# Patient Record
Sex: Male | Born: 1940
Health system: Southern US, Community
[De-identification: ages and names within clinical notes are randomized; demographics above are authoritative.]

## PROBLEM LIST (undated history)

## (undated) DIAGNOSIS — R0609 Other forms of dyspnea: Secondary | ICD-10-CM

## (undated) DIAGNOSIS — I10 Essential (primary) hypertension: Secondary | ICD-10-CM

## (undated) DIAGNOSIS — R059 Cough, unspecified: Secondary | ICD-10-CM

## (undated) DIAGNOSIS — R9439 Abnormal result of other cardiovascular function study: Secondary | ICD-10-CM

## (undated) DIAGNOSIS — I5020 Unspecified systolic (congestive) heart failure: Secondary | ICD-10-CM

## (undated) DIAGNOSIS — I998 Other disorder of circulatory system: Secondary | ICD-10-CM

## (undated) DIAGNOSIS — N200 Calculus of kidney: Secondary | ICD-10-CM

## (undated) DIAGNOSIS — I209 Angina pectoris, unspecified: Secondary | ICD-10-CM

## (undated) DIAGNOSIS — G43909 Migraine, unspecified, not intractable, without status migrainosus: Secondary | ICD-10-CM

## (undated) DIAGNOSIS — E668 Other obesity: Secondary | ICD-10-CM

## (undated) DIAGNOSIS — K219 Gastro-esophageal reflux disease without esophagitis: Secondary | ICD-10-CM

## (undated) DIAGNOSIS — R05 Cough: Secondary | ICD-10-CM

## (undated) DIAGNOSIS — N183 Chronic kidney disease, stage 3 (moderate): Secondary | ICD-10-CM

## (undated) DIAGNOSIS — I2510 Atherosclerotic heart disease of native coronary artery without angina pectoris: Secondary | ICD-10-CM

## (undated) DIAGNOSIS — E7800 Pure hypercholesterolemia, unspecified: Secondary | ICD-10-CM

## (undated) DIAGNOSIS — J309 Allergic rhinitis, unspecified: Secondary | ICD-10-CM

## (undated) DIAGNOSIS — G459 Transient cerebral ischemic attack, unspecified: Secondary | ICD-10-CM

## (undated) DIAGNOSIS — G20A1 Parkinson's disease without dyskinesia, without mention of fluctuations: Secondary | ICD-10-CM

## (undated) HISTORY — DX: Essential (primary) hypertension: I10

## (undated) HISTORY — PX: HERNIA REPAIR: SHX51

## (undated) HISTORY — PX: TONSILLECTOMY: SUR1361

## (undated) HISTORY — DX: Cough: R05

## (undated) HISTORY — DX: Other disorder of circulatory system: I99.8

## (undated) HISTORY — DX: Abnormal result of other cardiovascular function study: R94.39

## (undated) HISTORY — DX: Other obesity: E66.8

## (undated) HISTORY — DX: Calculus of kidney: N20.0

## (undated) HISTORY — DX: Cough, unspecified: R05.9

## (undated) HISTORY — DX: Migraine, unspecified, not intractable, without status migrainosus: G43.909

## (undated) HISTORY — DX: Transient cerebral ischemic attack, unspecified: G45.9

## (undated) HISTORY — PX: OTHER SURGICAL HISTORY: SHX169

## (undated) HISTORY — DX: Other forms of dyspnea: R06.09

## (undated) HISTORY — DX: Pure hypercholesterolemia, unspecified: E78.00

## (undated) HISTORY — DX: Angina pectoris, unspecified: I20.9

## (undated) HISTORY — DX: Gastro-esophageal reflux disease without esophagitis: K21.9

## (undated) HISTORY — DX: Atherosclerotic heart disease of native coronary artery without angina pectoris: I25.10

## (undated) HISTORY — DX: Chronic kidney disease, stage 3 (moderate): N18.3

## (undated) HISTORY — DX: Allergic rhinitis, unspecified: J30.9

---

## 1997-11-17 ENCOUNTER — Ambulatory Visit (HOSPITAL_COMMUNITY): Admission: RE | Admit: 1997-11-17 | Discharge: 1997-11-17 | Payer: Self-pay | Admitting: *Deleted

## 1997-11-26 ENCOUNTER — Emergency Department (HOSPITAL_COMMUNITY): Admission: EM | Admit: 1997-11-26 | Discharge: 1997-11-26 | Payer: Self-pay | Admitting: Internal Medicine

## 1998-01-14 ENCOUNTER — Ambulatory Visit (HOSPITAL_COMMUNITY): Admission: RE | Admit: 1998-01-14 | Discharge: 1998-01-14 | Payer: Self-pay | Admitting: *Deleted

## 1999-07-26 ENCOUNTER — Encounter: Payer: Self-pay | Admitting: Emergency Medicine

## 1999-07-26 ENCOUNTER — Emergency Department (HOSPITAL_COMMUNITY): Admission: EM | Admit: 1999-07-26 | Discharge: 1999-07-26 | Payer: Self-pay | Admitting: Emergency Medicine

## 2000-01-05 ENCOUNTER — Encounter: Admission: RE | Admit: 2000-01-05 | Discharge: 2000-01-05 | Payer: Self-pay | Admitting: Internal Medicine

## 2000-01-05 ENCOUNTER — Encounter: Payer: Self-pay | Admitting: Internal Medicine

## 2000-01-25 ENCOUNTER — Emergency Department (HOSPITAL_COMMUNITY): Admission: EM | Admit: 2000-01-25 | Discharge: 2000-01-26 | Payer: Self-pay

## 2000-01-30 ENCOUNTER — Ambulatory Visit (HOSPITAL_COMMUNITY): Admission: RE | Admit: 2000-01-30 | Discharge: 2000-01-30 | Payer: Self-pay | Admitting: Emergency Medicine

## 2000-01-30 ENCOUNTER — Encounter: Payer: Self-pay | Admitting: Emergency Medicine

## 2000-02-02 ENCOUNTER — Emergency Department (HOSPITAL_COMMUNITY): Admission: EM | Admit: 2000-02-02 | Discharge: 2000-02-02 | Payer: Self-pay | Admitting: Emergency Medicine

## 2002-03-06 ENCOUNTER — Encounter: Payer: Self-pay | Admitting: Internal Medicine

## 2002-03-06 ENCOUNTER — Encounter: Admission: RE | Admit: 2002-03-06 | Discharge: 2002-03-06 | Payer: Self-pay | Admitting: Internal Medicine

## 2002-08-26 ENCOUNTER — Encounter: Payer: Self-pay | Admitting: Internal Medicine

## 2002-08-26 ENCOUNTER — Encounter: Admission: RE | Admit: 2002-08-26 | Discharge: 2002-08-26 | Payer: Self-pay | Admitting: Internal Medicine

## 2002-09-18 ENCOUNTER — Ambulatory Visit: Admission: RE | Admit: 2002-09-18 | Discharge: 2002-09-18 | Payer: Self-pay | Admitting: Internal Medicine

## 2002-09-18 ENCOUNTER — Encounter: Payer: Self-pay | Admitting: Orthopedic Surgery

## 2002-09-23 ENCOUNTER — Encounter: Payer: Self-pay | Admitting: Orthopedic Surgery

## 2002-09-23 ENCOUNTER — Inpatient Hospital Stay (HOSPITAL_COMMUNITY): Admission: RE | Admit: 2002-09-23 | Discharge: 2002-09-28 | Payer: Self-pay | Admitting: Orthopedic Surgery

## 2003-11-16 ENCOUNTER — Emergency Department (HOSPITAL_COMMUNITY): Admission: EM | Admit: 2003-11-16 | Discharge: 2003-11-16 | Payer: Self-pay | Admitting: Emergency Medicine

## 2004-09-28 ENCOUNTER — Inpatient Hospital Stay (HOSPITAL_COMMUNITY): Admission: RE | Admit: 2004-09-28 | Discharge: 2004-10-02 | Payer: Self-pay | Admitting: Orthopedic Surgery

## 2005-11-28 ENCOUNTER — Ambulatory Visit (HOSPITAL_COMMUNITY): Admission: RE | Admit: 2005-11-28 | Discharge: 2005-11-28 | Payer: Self-pay | Admitting: *Deleted

## 2005-11-28 ENCOUNTER — Encounter (INDEPENDENT_AMBULATORY_CARE_PROVIDER_SITE_OTHER): Payer: Self-pay | Admitting: *Deleted

## 2006-04-02 ENCOUNTER — Encounter: Admission: RE | Admit: 2006-04-02 | Discharge: 2006-04-02 | Payer: Self-pay | Admitting: Internal Medicine

## 2006-05-16 ENCOUNTER — Emergency Department (HOSPITAL_COMMUNITY): Admission: EM | Admit: 2006-05-16 | Discharge: 2006-05-16 | Payer: Self-pay | Admitting: Emergency Medicine

## 2006-06-07 ENCOUNTER — Encounter: Admission: RE | Admit: 2006-06-07 | Discharge: 2006-06-07 | Payer: Self-pay | Admitting: Internal Medicine

## 2006-07-02 ENCOUNTER — Encounter: Admission: RE | Admit: 2006-07-02 | Discharge: 2006-07-02 | Payer: Self-pay | Admitting: Internal Medicine

## 2006-08-22 ENCOUNTER — Encounter: Admission: RE | Admit: 2006-08-22 | Discharge: 2006-08-22 | Payer: Self-pay | Admitting: Otolaryngology

## 2007-01-30 ENCOUNTER — Ambulatory Visit (HOSPITAL_COMMUNITY): Admission: RE | Admit: 2007-01-30 | Discharge: 2007-01-30 | Payer: Self-pay | Admitting: *Deleted

## 2007-07-22 ENCOUNTER — Ambulatory Visit: Payer: Self-pay | Admitting: Internal Medicine

## 2007-07-22 DIAGNOSIS — R059 Cough, unspecified: Secondary | ICD-10-CM | POA: Insufficient documentation

## 2007-07-22 DIAGNOSIS — I10 Essential (primary) hypertension: Secondary | ICD-10-CM | POA: Insufficient documentation

## 2007-07-22 DIAGNOSIS — R05 Cough: Secondary | ICD-10-CM | POA: Insufficient documentation

## 2007-08-05 ENCOUNTER — Ambulatory Visit: Payer: Self-pay | Admitting: Internal Medicine

## 2007-11-25 ENCOUNTER — Ambulatory Visit: Payer: Self-pay | Admitting: Internal Medicine

## 2007-12-01 ENCOUNTER — Telehealth (INDEPENDENT_AMBULATORY_CARE_PROVIDER_SITE_OTHER): Payer: Self-pay | Admitting: *Deleted

## 2007-12-03 ENCOUNTER — Ambulatory Visit: Payer: Self-pay | Admitting: Cardiology

## 2007-12-30 ENCOUNTER — Ambulatory Visit: Payer: Self-pay | Admitting: Internal Medicine

## 2008-01-05 ENCOUNTER — Emergency Department (HOSPITAL_COMMUNITY): Admission: EM | Admit: 2008-01-05 | Discharge: 2008-01-05 | Payer: Self-pay | Admitting: Emergency Medicine

## 2008-01-07 ENCOUNTER — Emergency Department (HOSPITAL_COMMUNITY): Admission: EM | Admit: 2008-01-07 | Discharge: 2008-01-07 | Payer: Self-pay | Admitting: Emergency Medicine

## 2008-01-09 ENCOUNTER — Telehealth: Payer: Self-pay | Admitting: Internal Medicine

## 2008-01-13 ENCOUNTER — Telehealth: Payer: Self-pay | Admitting: Internal Medicine

## 2008-01-16 ENCOUNTER — Encounter: Admission: RE | Admit: 2008-01-16 | Discharge: 2008-01-16 | Payer: Self-pay | Admitting: Internal Medicine

## 2008-01-23 ENCOUNTER — Ambulatory Visit: Payer: Self-pay | Admitting: Internal Medicine

## 2008-01-23 DIAGNOSIS — J309 Allergic rhinitis, unspecified: Secondary | ICD-10-CM | POA: Insufficient documentation

## 2008-01-23 LAB — CONVERTED CEMR LAB: IgE (Immunoglobulin E), Serum: 141 intl units/mL (ref 0.0–180.0)

## 2008-01-27 ENCOUNTER — Telehealth: Payer: Self-pay | Admitting: Internal Medicine

## 2008-01-29 ENCOUNTER — Ambulatory Visit: Payer: Self-pay | Admitting: Internal Medicine

## 2008-02-02 ENCOUNTER — Telehealth: Payer: Self-pay | Admitting: Internal Medicine

## 2008-02-02 LAB — CONVERTED CEMR LAB
Basophils Absolute: 0 10*3/uL (ref 0.0–0.1)
Basophils Relative: 0 % (ref 0.0–3.0)
Eosinophils Absolute: 0.3 10*3/uL (ref 0.0–0.7)
Eosinophils Relative: 2.1 % (ref 0.0–5.0)
HCT: 48.5 % (ref 39.0–52.0)
MCHC: 34.8 g/dL (ref 30.0–36.0)
Neutro Abs: 11.6 10*3/uL — ABNORMAL HIGH (ref 1.4–7.7)
RDW: 13.1 % (ref 11.5–14.6)
WBC: 14.8 10*3/uL — ABNORMAL HIGH (ref 4.5–10.5)

## 2008-02-12 ENCOUNTER — Encounter: Payer: Self-pay | Admitting: Internal Medicine

## 2008-02-18 ENCOUNTER — Encounter: Admission: RE | Admit: 2008-02-18 | Discharge: 2008-03-11 | Payer: Self-pay | Admitting: Internal Medicine

## 2008-02-19 ENCOUNTER — Encounter: Payer: Self-pay | Admitting: Internal Medicine

## 2008-02-19 ENCOUNTER — Encounter: Admission: RE | Admit: 2008-02-19 | Discharge: 2008-02-19 | Payer: Self-pay | Admitting: Internal Medicine

## 2008-02-23 ENCOUNTER — Ambulatory Visit: Payer: Self-pay | Admitting: Internal Medicine

## 2008-03-09 ENCOUNTER — Encounter: Payer: Self-pay | Admitting: Internal Medicine

## 2008-04-01 ENCOUNTER — Ambulatory Visit: Payer: Self-pay | Admitting: Internal Medicine

## 2008-04-07 ENCOUNTER — Ambulatory Visit (HOSPITAL_COMMUNITY): Admission: RE | Admit: 2008-04-07 | Discharge: 2008-04-07 | Payer: Self-pay | Admitting: *Deleted

## 2008-04-21 ENCOUNTER — Encounter: Admission: RE | Admit: 2008-04-21 | Discharge: 2008-04-21 | Payer: Self-pay | Admitting: Internal Medicine

## 2008-05-23 ENCOUNTER — Encounter: Admission: RE | Admit: 2008-05-23 | Discharge: 2008-05-23 | Payer: Self-pay | Admitting: Orthopedic Surgery

## 2008-07-30 ENCOUNTER — Ambulatory Visit: Payer: Self-pay | Admitting: Internal Medicine

## 2008-11-16 ENCOUNTER — Ambulatory Visit: Payer: Self-pay | Admitting: Internal Medicine

## 2009-01-27 ENCOUNTER — Ambulatory Visit: Payer: Self-pay | Admitting: Internal Medicine

## 2009-02-10 ENCOUNTER — Telehealth (INDEPENDENT_AMBULATORY_CARE_PROVIDER_SITE_OTHER): Payer: Self-pay | Admitting: *Deleted

## 2009-03-29 ENCOUNTER — Ambulatory Visit: Payer: Self-pay | Admitting: Internal Medicine

## 2009-07-07 ENCOUNTER — Ambulatory Visit: Payer: Self-pay | Admitting: Internal Medicine

## 2009-09-13 ENCOUNTER — Ambulatory Visit: Payer: Self-pay | Admitting: Internal Medicine

## 2009-09-13 DIAGNOSIS — K219 Gastro-esophageal reflux disease without esophagitis: Secondary | ICD-10-CM | POA: Insufficient documentation

## 2009-12-19 ENCOUNTER — Ambulatory Visit: Payer: Self-pay | Admitting: Internal Medicine

## 2010-02-24 DIAGNOSIS — R9439 Abnormal result of other cardiovascular function study: Secondary | ICD-10-CM

## 2010-02-24 HISTORY — DX: Abnormal result of other cardiovascular function study: R94.39

## 2010-03-14 ENCOUNTER — Ambulatory Visit: Payer: Self-pay | Admitting: Internal Medicine

## 2010-04-18 ENCOUNTER — Ambulatory Visit: Payer: Self-pay | Admitting: Internal Medicine

## 2010-06-20 ENCOUNTER — Telehealth: Payer: Self-pay | Admitting: Internal Medicine

## 2010-06-20 NOTE — Assessment & Plan Note (Signed)
Summary: 6 months/apc   Primary Provider/Referring Provider:  Selena Batten  CC:  6 month follow up , c/o prod cough clear , and hoarsness x 2 weeks.  History of Present Illness: September 13, 2009- Allergic rhinitis, cough, ACE/ ARB induced cough Twice in past week had woken with burning liquid in throat. Says this began when protonix was changed to generic. He will speak to his primary about it. More sinus drainage- hadn't been taking his loratadine. Taking Zpak. Asks refill cough syrup. Had pneumonia vaccine earlier this Spring. He remains hoarse but cough and heartburn have faded.  December 19, 2009- Allergic rhinitis, cough, ACE/ ARB induced cough Stung by yellow jacket 3 weeks ago. Took benadryl. Has hx of throat swelling in remote past from sting. He has been feeling pressured or full in ears, especially left, postnasal drip, hoarseness. Using Neti pot 3 x/ day. Hydromet has been good, allowing sleep. Puffy eyes. chest has been clear. Denies heartburn or reflux.  March 14, 2010- Allergic rhinitis, cough, ACE/ ARB induced cough Nurse-CC: 6 month follow up , c/o prod cough clear , hoarsness x 2 weeks Chronic cough. Worse in 2 weeks. has been in [place] twice lately in recent weeks and at altitude his right ear pops. Dr Haroldine Laws, ENT had sent him here originally, suspecting allergy. Cough is productive in the mornings, has to cough open (scant) before he can eat. Cough wakes him. Continues Protonix.  Dr Selena Batten sent him for treadmill and cardiologist Dr Newt Lukes plans to cath. He remains off ACEI, and cough did seem to improve at first, but that is not the whole explanation.    Preventive Screening-Counseling & Management  Alcohol-Tobacco     Smoking Status: never  Current Medications (verified): 1)  Protonix 40 Mg  Tbec (Pantoprazole Sodium) .... Take One 30-60 Min Before First and Last Meals of The Day 2)  Adult Aspirin Low Strength 81 Mg  Tbdp (Aspirin) .... Once Daily 3)  Diovan 320 Mg Tabs  (Valsartan) .... Take 1 Tablet By Mouth Once A Day 4)  Sinus Wash Neti Pot 2300-700 Mg Kit (Sodium Chloride-Sodium Bicarb) .... Use As Directed 5)  Singulair 10 Mg Tabs (Montelukast Sodium) .Marland Kitchen.. 1 Once Daily 6)  Pravastatin Sodium 40 Mg Tabs (Pravastatin Sodium) .Marland Kitchen.. 1 Once Daily 7)  Carvedilol 6.25 Mg Tabs (Carvedilol) .Marland Kitchen.. 1 Once Daily  Allergies (verified): 1)  ! Novocain  Past History:  Past Medical History: Last updated: 02/23/2008 ALLERGIC RHINITIS (ICD-477.9) Skin test Pos 02/23/08 COUGH (ICD-786.2) HYPERTENSION (ICD-401.9)    Past Surgical History: Last updated: 11/16/2008 Tonsillectomy Reset nasal fracture Bilateral TKR Hernia  Family History: Last updated: 11/16/2008 allergy mother GM- TB Father- died MI  Social History: Last updated: 01/23/2008 Patient never smoked.  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  The patient denies shortness of breath with activity, shortness of breath at rest, coughing up blood, chest pain, irregular heartbeats, acid heartburn, indigestion, loss of appetite, weight change, abdominal pain, difficulty swallowing, sore throat, tooth/dental problems, headaches, nasal congestion/difficulty breathing through nose, and sneezing.    Vital Signs:  Patient profile:   70 year old male Height:      68 inches Weight:      223.8 pounds BMI:     34.15 O2 Sat:  98 % on Room air Pulse rate:   60 / minute BP sitting:   110 / 70  (left arm) Cuff size:   large  Vitals Entered By: Renold Genta RCP, LPN (March 14, 2010 3:17 PM)  O2 Flow:  Room air CC: 6 month follow up , c/o prod cough clear , hoarsness x 2 weeks Is Patient Diabetic? No Comments Medications reviewed with patient Renold Genta RCP, LPN  March 14, 2010 3:17 PM    Physical  Exam  Additional Exam:  General: A/Ox3; pleasant and cooperative, NAD, obese. he does not look acutely ill today. SKIN: no rash, lesions NODES: no lymphadenopathy HEENT: [place]/AT, EOM- WNL, Conjuctivae- clear, PERRLA, TM-WNL, , Nose- clear , Throat- Mallampati II, gravelly voice, cobblestoned . Marland Kitchen NECK: Supple w/ fair ROM, JVD- none, normal carotid impulses w/o bruits Thyroid-  CHEST: quiet with distant sounds but no rales or rhonchi HEART: RRR, no m/g/r heard ABDOMEN: overweight [id number]:[id number], nl pulses, trace edema, bilateral TKR scars NEURO: Grossly intact to observation      Impression & Recommendations:  Problem # 1:  COUGH (ICD-786.2)  I remain believing that his GERD is an important basis for cough and will direct this issue to Dr Selena Batten. He says he is taking his acid blocker. but his throat looks irritated. .  We will check PFT for question,  with chronic cough,  that there is a cough equivalent asthma.   Problem # 2:  ALLERGIC RHINITIS (ICD-477.9) He denies significant postnasal drip, sneezing or blowing. His updated medication list for this problem includes:    Sinus Wash Neti Pot 2300-700 Mg Kit (Sodium chloride-sodium bicarb) ..... Use as directed  Problem # 3:  GERD (ICD-530.81)  His updated medication list for this problem includes:    Protonix 40 Mg Tbec (Pantoprazole sodium) .Marland Kitchen... Take one 30-60 min before first and last meals of the day  Orders: Est. Patient Level III (06237)  Medications Added to Medication List This Visit: 1)  Singulair 10 Mg Tabs (Montelukast sodium) .Marland Kitchen.. 1 once daily 2)  Pravastatin Sodium 40 Mg Tabs (Pravastatin sodium) .Marland Kitchen.. 1 once daily 3)  Carvedilol 6.25 Mg Tabs (Carvedilol) .Marland Kitchen.. 1 once daily 4)  Hydromet 5-1.5 Mg/36ml Syrp (Hydrocodone-homatropine) .Marland Kitchen.. 1 teaspoon four times a day as needed cough  Other Orders: Misc. Referral (Misc. Ref)  Patient Instructions: 1)  Please schedule a follow-up appointment in 2 months. 2)  Schedule PFT 3)   cc Dr Selena Batten 4)  script for cough syrup Prescriptions: HYDROMET 5-1.5 MG/5ML SYRP (HYDROCODONE-HOMATROPINE) 1 teaspoon four times a day as needed cough  #200 ml x 1   Entered and Authorized by:   Waymon Budge MD   Signed by:   Waymon Budge MD on 03/14/2010   Method used:   Print then Give to Patient   RxID:   [id number]

## 2010-06-20 NOTE — Assessment & Plan Note (Signed)
Summary: congested/ sinus problem/ mbw   Primary Provider/Referring Provider:  Selena Batten  CC:  Accute visit-congestion in head and neck area-puffy eyes.Keith [name]  History of Present Illness: History of Present Illness: March 29, 2009- Allergic rhinitis, cough, ACE/ ARB induced cough Still coughs esp on lying down- blames on postnasal drip without reflux. Hydrocone for back pain will supress it. Changing BP med didn't correct the cough. He has had ENT eval before by Dr Haroldine Laws. He denies reflux. Skin test and RAST allergy eval were reviewed- atopic with reaction to grass, weed, tree. CXR- mild atelectasis.  July 07, 2009- Allergic rhinitis, cough, ACE/ARB induced cough Still notes postnasal drainage which hasn't responded to anything he tries. Mucinex hurts his stomach. 2-3 weeks ago burp refluxed bolus of liquid. Coughs as soon as he lies down. Primary gave Zpak and Chertussin cough syrup. He says he did prop up head of bed.  We reviewed allergy tests from 10/09, strongly positive then for tree and grass pollens. He has not tried allergy vaccine.  September 13, 2009- Allergic rhinitis, cough, ACE/ ARB induced cough Twice in past week had woken with burning liquid in throat. Says this began when protonix was changed to generic. He will speak to his primary about it. More sinus drainage- hadn't been taking his loratadine. Taking Zpak. Asks refill cough syrup. Had pneumonia vaccine earlier this Spring. He remains hoarse but cough and heartburn have faded.  December 19, 2009- Allergic rhinitis, cough, ACE/ ARB induced cough Stung by yellow jacket 3 weeks ago. Took benadryl. Has hx of throat swelling in remote past from sting. He has been feeling pressured or full in ears, especially left, postnasal drip, hoarseness. Using Neti pot 3 x/ day. Hydromet has been good, allowing sleep. Puffy eyes. chest has been clear. Denies heartburn or reflus.     Preventive Screening-Counseling &  Management  Alcohol-Tobacco     Smoking Status: never  Current Medications (verified): 1)  Protonix 40 Mg  Tbec (Pantoprazole Sodium) .... Take One 30-60 Min Before First and Last Meals of The Day 2)  Adult Aspirin Low Strength 81 Mg  Tbdp (Aspirin) .... Once Daily 3)  Diovan 320 Mg Tabs (Valsartan) .... Take 1 Tablet By Mouth Once A Day 4)  Hydromet 5-1.5 Mg/37ml Syrp (Hydrocodone-Homatropine) .Keith [name].. 1 Teaspoon Four Times A Day As Needed Occasional Use For Cough. 5)  Sinus Wash Neti Pot 2300-700 Mg Kit (Sodium Chloride-Sodium Bicarb) .... Use As Directed  Allergies (verified): 1)  ! Novocain  Past History:  Past Medical History: Last updated: 02/23/2008 ALLERGIC RHINITIS (ICD-477.9) Skin test Pos 02/23/08 COUGH (ICD-786.2) HYPERTENSION (ICD-401.9)    Past Surgical History: Last updated: 11/16/2008 Tonsillectomy Reset nasal fracture Bilateral TKR Hernia  Family History: Last updated: 11/16/2008 allergy mother GM- TB Father- died MI  Social History: Last updated: 01/23/2008 Patient never smoked.  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  The patient denies shortness of breath with activity, shortness of breath at rest, productive cough, non-productive cough, coughing up blood, chest pain, irregular heartbeats, indigestion, loss of appetite, weight change, abdominal pain, difficulty swallowing, sore throat, tooth/dental problems, headaches, and sneezing.    Vital Signs:  Patient profile:   70 year old male Height:      68 inches Weight:  221.38 pounds BMI:     33.78 O2 Sat:      98 % on Room air Pulse rate:   81 / minute BP sitting:   122 / 86  (right arm) Cuff size:   regular  Vitals Entered By: Reynaldo Minium CMA (December 19, 2009 2:36 PM)  O2 Flow:  Room  air CC: Accute visit-congestion in head and neck area-puffy eyes.   Physical Exam  Additional Exam:  General: A/Ox3; pleasant and cooperative, NAD, obese. he does not look acutely ill today. SKIN: no rash, lesions NODES: no lymphadenopathy HEENT: [place]/AT, EOM- WNL, Conjuctivae- clear, PERRLA, TM-WNL, , Nose- clear , Throat- Mallampati II, gravelly voice, cobblestoned . Keith [name] NECK: Supple w/ fair ROM, JVD- none, normal carotid impulses w/o bruits Thyroid-  CHEST: quiet with distant sounds but no rales or rhonchi HEART: RRR, no m/g/r heard ABDOMEN: overweight [id number], nl pulses, trace edema, bilateral TKR scars NEURO: Grossly intact to observation      Impression & Recommendations:  Problem # 1:  ALLERGIC RHINITIS (ICD-477.9)  I think he has gotten a bit overdried, and irritated by the current ozone levels. I will have him try Nasalcrom, sudafed-PE , and a standby antibiotic.  The following medications were removed from the medication list:    Loratadine 10 Mg Tabs (Loratadine) .Keith [name]... Take 1 tablet by mouth once a day His updated medication list for this problem includes:    Sinus Wash Neti Pot 2300-700 Mg Kit (Sodium chloride-sodium bicarb) ..... Use as directed  Problem # 2:  COUGH (ICD-786.2) He is not concerned with lower respiratory discomfort, dyspnea, wheeze, cough or phlegm now.  Medications Added to Medication List This Visit: 1)  Sinus Wash Neti Pot 2300-700 Mg Kit (Sodium chloride-sodium bicarb) .... Use as directed 2)  Amoxicillin 500 Mg Caps (Amoxicillin) .Keith [name].. 1 three times a day  Other Orders: Est. Patient Level IV (47829)  Patient Instructions: 1)  Return as scheduled or earlier as needed 2)  Script for antibiotic amoxacillin 3)  Try decongestant Sudafed-PE otc for stuffiness 4)  Try otc Nasalcrom/ cromolyn nasal antiinflammatory spray. Prescriptions: AMOXICILLIN 500 MG CAPS (AMOXICILLIN) 1 three times a day  #21 x 0   Entered and Authorized by:   Waymon Budge MD   Signed by:   Waymon Budge MD on 12/19/2009   Method used:   Print then Give to Patient   RxID:   [id number]

## 2010-06-20 NOTE — Miscellaneous (Signed)
Summary: Orders Update pft charges  Clinical Lists Changes  Orders: Added new Service order of Carbon Monoxide diffusing w/capacity (94720) - Signed Added new Service order of Lung Volumes (94240) - Signed Added new Service order of Spirometry (Pre & Post) (94060) - Signed 

## 2010-06-20 NOTE — Assessment & Plan Note (Signed)
Summary: 6 months/apc   Primary Provider/Referring Provider:  Selena Batten  CC:  6 month follow up visit-? Heartburn episodes;treated for Bronchitis and cough recently; Increased Sinus trouble.Marland Kitchen  History of Present Illness: March 29, 2009- Allergic rhinitis, cough, ACE/ ARB induced cough Still coughs esp on lying down- blames on postnasal drip without reflux. Hydrocone for back pain will supress it. Changing BP med didn't correct the cough. He has had ENT eval before by Dr Haroldine Laws. He denies reflux. Skin test and RAST allergy eval were reviewed- atopic with reaction to grass, weed, tree. CXR- mild atelectasis.  July 07, 2009- Allergic rhinitis, cough, ACE/ARB induced cough Still notes postnasal drainage which hasn't responded to anything he tries. Mucinex hurts his stomach. 2-3 weeks ago burp refluxed bolus of liquid. Coughs as soon as he lies down. Primary gave Zpak and Chertussin cough syrup. He says he did prop up head of bed.  We reviewed allergy tests from 10/09, strongly positive then for tree and grass pollens. He has not tried allergy vaccine.  September 13, 2009- Allergic rhinitis, cough, ACE/ ARB induced cough Twice in past week had woken with burning liquid in throat. Says this began when protonix was changed to generic. He will speak to his primary about it. More sinus drainage- hadn't been taking his loratadine. Taking Zpak. Asks refill cough syrup. Had pneumonia vaccine earlier this Spring. He remains hoarse but cough and heartburn have faded.    Current Medications (verified): 1)  Protonix 40 Mg  Tbec (Pantoprazole Sodium) .... Take One 30-60 Min Before First and Last Meals of The Day 2)  Adult Aspirin Low Strength 81 Mg  Tbdp (Aspirin) .... Once Daily 3)  Diovan 320 Mg Tabs (Valsartan) .... Take 1 Tablet By Mouth Once A Day 4)  Loratadine 10 Mg Tabs (Loratadine) .... Take 1 Tablet By Mouth Once A Day  Allergies (verified): 1)  ! Novocain  Past History:  Past Medical  History: Last updated: 02/23/2008 ALLERGIC RHINITIS (ICD-477.9) Skin test Pos 02/23/08 COUGH (ICD-786.2) HYPERTENSION (ICD-401.9)    Past Surgical History: Last updated: 11/16/2008 Tonsillectomy Reset nasal fracture Bilateral TKR Hernia  Family History: Last updated: 11/16/2008 allergy mother GM- TB Father- died MI  Social History: Last updated: 01/23/2008 Patient never smoked.  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    Vital Signs:  Patient profile:   70 year old male Height:      68 inches Weight:      224.50 pounds BMI:     34.26 O2 Sat:      97 % on Room air Pulse rate:   89 / minute BP sitting:   140 / 82  (right arm) Cuff size:   regular  Vitals Entered By: Reynaldo Minium CMA (September 13, 2009 9:52 AM)  O2 Flow:  Room air  Physical Exam  Additional Exam:  General: A/Ox3; pleasant and cooperative, NAD, obese SKIN: no rash, lesions NODES: no lymphadenopathy HEENT: [place]/AT, EOM- WNL, Conjuctivae- clear, PERRLA, TM-WNL, Right TMJ pops and clicks., Nose- clear , Throat- Mallamptai II, gravelly voice, cobblestoned . Marland Kitchen NECK: Supple w/ fair ROM, JVD- none, normal carotid impulses w/o bruits Thyroid-  CHEST: quiet with distant sounds  but no rales or rhonchi HEART: RRR, no m/g/r heard ABDOMEN:  AOZ:HYQM, nl pulses, trace edema, bilateral TKR scars NEURO: Grossly intact to observation      Impression & Recommendations:  Problem # 1:  ALLERGIC RHINITIS (ICD-477.9)  Fair control. We discussed loratadine as needed. His updated medication list for this problem includes:    Loratadine 10 Mg Tabs (Loratadine)  .Marland Kitchen... Take 1 tablet by mouth once a day  Problem # 2:  COUGH (ICD-786.2)  Strong hx of reflux since PPI was changed to generic. He will hold some cough syrup, but is cautioned against over uswe.  Problem # 3:  GERD (ICD-530.81) He will discuss this with his primary. I suggested he elevate head of bed. His updated medication list for this problem includes:    Protonix 40 Mg Tbec (Pantoprazole sodium) .Marland Kitchen... Take one 30-60 min before first and last meals of the day  Medications Added to Medication List This Visit: 1)  Hydromet 5-1.5 Mg/27ml Syrp (Hydrocodone-homatropine) .Marland Kitchen.. 1 teaspoon four times a day as needed occasional use for cough. 2)  Benzonatate 100 Mg Caps (Benzonatate) .Marland Kitchen.. 1 or 2 three times a day as needed cough  Other Orders: Est. Patient Level III (57846)  Patient Instructions: 1)  Please schedule a follow-up appointment in 6 months. 2)  Speak to your primary doctor about how to control your heartburn. Consider elevating the head end of your bed with a brick uner each leg, to keep stomach acid down. 3)  Scripts for Hydromet cough syrup, and also for the benzonatate perles for cough. 4)  Try Allegra 180 to see if it works better than loratadine for you as an antihistamine for drainage. Prescriptions: BENZONATATE 100 MG CAPS (BENZONATATE) 1 or 2 three times a day as needed cough  #25 x 0   Entered and Authorized by:   Waymon Budge MD   Signed by:   Waymon Budge MD on 09/13/2009   Method used:   Print then Give to Patient   RxID:   [id number] HYDROMET 5-1.5 MG/5ML SYRP (HYDROCODONE-HOMATROPINE) 1 teaspoon four times a day as needed occasional use for cough.  #200 ml x 0   Entered and Authorized by:   Waymon Budge MD   Signed by:   Waymon Budge MD on 09/13/2009   Method used:   Print then Give to Patient   RxID:   [id number]

## 2010-06-20 NOTE — Assessment & Plan Note (Signed)
Summary: rov/ mbw   Primary Provider/Referring Provider:  Selena Batten  CC:  follow up visit.  History of Present Illness:  01/27/09- Allergic rhinitis, cough, ACE/ARB induced cough Says his only problem for past 3 weeks is morning cough and congestion. Denies fever, chest pain. Feels some congestion in head which gives him vertigo. He will do exercises taught by physical therapist which resolve the vertigo. Cough is productive, only white. Denies chest pain, leg swelling. BP med was changed to Diovan HCT which stopped leg swellng and aching. Had CXR last year at [facility]. Had GERD at that time.Has had episode of waking with choking/ strangling once or twice since last here.  March 29, 2009- Allergic rhinitis, cough, ACE/ ARB induced cough Still coughs esp on lying down- blames on postnasal drip without reflux. Hydrocone for back pain will supress it. Changing BP med didn't correct the cough. He has had ENT eval before by Dr Haroldine Laws. He denies reflux. Skin test and RAST allergy eval were reviewed- atopic with reaction to grass, weed, tree. CXR- mild atelectasis.  July 07, 2009- Allergic rhinitis, cough, ACE/ARB induced cough Still notes postnasal drainage which hasn't responded to anything he tries. Mucinex hurts his stomach. 2-3 weeks ago burp refluxed bolus of liquid. Coughs as soon as he lies down. Primary gave Zpak and Chertussin cough syrup. He says he did prop up head of bed.  We reviewed allergy tests from 10/09, strongly positive then for tree and grass pollens. He has not tried allergy vaccine.  Current Medications (verified): 1)  Protonix 40 Mg  Tbec (Pantoprazole Sodium) .... Take One 30-60 Min Before First and Last Meals of The Day 2)  Adult Aspirin Low Strength 81 Mg  Tbdp (Aspirin) .... Once Daily 3)  Diovan 320 Mg Tabs (Valsartan) .... Take 1 Tablet By Mouth Once A Day 4)  Loratadine 10 Mg Tabs (Loratadine) .... Take 1 Tablet By Mouth Once A Day 5)  Cheratussin Ac 100-10  Mg/36ml Syrp (Guaifenesin-Codeine) .... Take 1 Tsp Every 12 Hours As Needed Cough  Allergies (verified): 1)  ! Novocain  Past History:  Past Medical History: Last updated: 02/23/2008 ALLERGIC RHINITIS (ICD-477.9) Skin test Pos 02/23/08 COUGH (ICD-786.2) HYPERTENSION (ICD-401.9)    Past Surgical History: Last updated: 11/16/2008 Tonsillectomy Reset nasal fracture Bilateral TKR Hernia  Family History: Last updated: 11/16/2008 allergy mother GM- TB Father- died MI  Social History: Last updated: 01/23/2008 Patient never smoked.  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  The patient denies anorexia, fever, weight loss, weight gain, vision loss, decreased hearing, hoarseness, chest pain, syncope, dyspnea on exertion, peripheral edema, headaches, hemoptysis, abdominal pain, and severe indigestion/heartburn.    Vital Signs:  Patient profile:   70 year old male Height:      68 inches Weight:      227.38 pounds BMI:     34.70 O2 Sat:      94 % on Room air Pulse rate:   82 / minute BP sitting:   132 / 78  (left arm) Cuff size:   regular  Vitals Entered By: Reynaldo Minium CMA (July 07, 2009 3:10 PM)  O2 Flow:  Room air  Physical Exam  Additional Exam:  General: A/Ox3; pleasant and cooperative, NAD, obese SKIN: no rash, lesions NODES: no lymphadenopathy HEENT: [place]/AT, EOM-  WNL, Conjuctivae- clear, PERRLA, TM-WNL, Right TMJ pops and clicks., Nose- clear , Throat- Melampatti II, gravelly voice, cobblestoned with spots of white mucus on posterior palate. He got hoarse after snorting then coughing. NECK: Supple w/ fair ROM, JVD- none, normal carotid impulses w/o bruits Thyroid-  CHEST: snorting and some fine bibasilar crackle HEART: RRR, no m/g/r heard ABDOMEN:  ZOX:WRUE, nl pulses, trace edema, bilateral TKR scars NEURO:  Grossly intact to observation      Impression & Recommendations:  Problem # 1:  ALLERGIC RHINITIS (ICD-477.9)  Positive skin testing before. If he gets a lot worse this Spring that would indicate that seasonal allergy is an important factor. He will try again with his Neti pot. Consider if a trialof allergy vaccine would be helpful. His updated medication list for this problem includes:    Loratadine 10 Mg Tabs (Loratadine) .Marland Kitchen... Take 1 tablet by mouth once a day  Problem # 2:  COUGH (ICD-786.2)  Multifactorial cough. He complains of "drainage" meaning liquid in his throat. This is clearly positional, without waterbrash. I think much of what he feels is bland reflux. We discussed reflux precautions.l  Medications Added to Medication List This Visit: 1)  Cheratussin Ac 100-10 Mg/50ml Syrp (Guaifenesin-codeine) .... Take 1 tsp every 12 hours as needed cough  Other Orders: Est. Patient Level III (45409)  Patient Instructions: 1)  Please schedule a follow-up appointment in 4 months. 2)  Try sample Patanase using up the sample at 2 puffs each nostril ltwice daily. 3)  You can retry you Neti pot, either before or after you try the Patanase.

## 2010-06-28 NOTE — Progress Notes (Signed)
Summary: refill on cough syrup  Phone Note Call from Patient   Caller: Patient Call For: dr young Summary of Call: patient phoned would like a refill on his cough medicine Hydromet Syrup. Patient uses [facility] on [address] [phone]. he can be reached at [phone] Initial call taken by: Vedia Coffer,  June 20, 2010 3:07 PM  Follow-up for Phone Call        Spoke with pt.  He is requesting refill on hydormet.  He states that for the past wk he has had increased cough with clear sputum.  He denies any other complaints.  Pls advise thanks allergic to novacain Follow-up by: Vernie Murders,  June 20, 2010 3:51 PM  Additional Follow-up for Phone Call Additional follow up Details #1::        Per CDY-okay.Reynaldo Minium CMA  June 20, 2010 4:40 PM   Rx called into pharmacy. pt aware.Carron Curie CMA  June 20, 2010 4:46 PM     Prescriptions: HYDROMET 5-1.5 MG/5ML SYRP (HYDROCODONE-HOMATROPINE) 1 teaspoon four times a day as needed cough  #200 ml x 0   Entered by:   Carron Curie CMA   Authorized by:   Waymon Budge MD   Signed by:   Carron Curie CMA on 06/20/2010   Method used:   Telephoned to ...       [facility]  [address] [id number]* (retail)       [address]       [place], [place]  [address]       Ph: [phone] or [phone]       Fax: [phone]   RxID:   [id number]

## 2010-09-12 ENCOUNTER — Ambulatory Visit: Payer: Self-pay | Admitting: Internal Medicine

## 2010-09-29 ENCOUNTER — Other Ambulatory Visit: Payer: Self-pay | Admitting: Cardiology

## 2010-09-29 DIAGNOSIS — N1330 Unspecified hydronephrosis: Secondary | ICD-10-CM

## 2010-10-03 NOTE — Op Note (Signed)
NAMEWENDEL, HOMEYER               ACCOUNT NO.:  [id number]   MEDICAL RECORD NO.:  [id number]          PATIENT TYPE:  AMB   LOCATION:  ENDO                         FACILITY:  [facility]   PHYSICIAN:  Georgiana Spinner, M.D.    DATE OF BIRTH:  08-Aug-1940   DATE OF PROCEDURE:  DATE OF DISCHARGE:                               OPERATIVE REPORT   PROCEDURE:  Upper endoscopy.   INDICATIONS:  GERD.   ANESTHESIA:  Demerol 100 mg, Versed 10 mg.   PROCEDURE:  With the patient mildly sedated in the left lateral  decubitus position, the Pentax videoscopic endoscope was inserted and  passed under direct vision through the esophagus which appeared normal.  I saw no evidence of Barrett's esophagus on a close inspection of the  squamocolumnar junction.  I entered into the stomach.  The fundus, body,  antrum, duodenal bulb, and second portion of the duodenum all appeared  normal.  From this point the endoscope was slowly withdrawn, taking  circumferential views of the duodenal mucosa until the endoscope had  been pulled back into the stomach, placed in retroflexion to view the  stomach from below.  The endoscope was straightened and withdrawn,  taking circumferential views of the remaining gastric and esophageal  mucosa, stopping to photograph the vocal cords which appeared normal and  nonerythematous.  The endoscope was withdrawn.  The patient's vital  signs and pulse oximeter remained stable.  The patient tolerated the  procedure well without apparent complication.   FINDINGS:  Negative examination.   PLAN:  Will increase the patient's Protonix from once a day before  breakfast to b.i.d. before breakfast and supper to see if it helps with  his  morning cough possibly related to reflux.           ______________________________  Georgiana Spinner, M.D.     GMO/MEDQ  D:  01/30/2007  T:  01/30/2007  Job:  [id number]   cc:   Hermelinda Medicus, M.D.  Fax: [phone]

## 2010-10-03 NOTE — Op Note (Signed)
Keith [name], Keith [name]               ACCOUNT NO.:  [id number]   MEDICAL RECORD NO.:  [id number]          PATIENT TYPE:  AMB   LOCATION:  ENDO                         FACILITY:  [facility]   PHYSICIAN:  Georgiana Spinner, M.D.    DATE OF BIRTH:  04-Nov-1940   DATE OF PROCEDURE:  DATE OF DISCHARGE:                               OPERATIVE REPORT   PROCEDURE:  Colonoscopy.   INDICATIONS:  Colon cancer screening.   ANESTHESIA:  Fentanyl 100 mcg, Versed 10 mg.   PROCEDURE:  With the patient mildly sedated in the left lateral  decubitus position, the Pentax videoscopic colonoscope was inserted in  the rectum after rectal examination was performed, which was  unremarkable.  The scope was passed under direct vision to the cecum,  identified by the ileocecal valve and appendiceal orifice, both of which  were photographed.  From this point the colonoscope was slowly  withdrawn, taking circumferential views of the colonic mucosa, stopping  only in the rectum, which appeared normal on direct and showed  hemorrhoids on retroflexed view.  The endoscope was straightened and  withdrawn.  The patient's vital signs, pulse oximeter remained stable.  The patient tolerated the procedure well without apparent complications.   FINDINGS:  Internal hemorrhoids, otherwise unremarkable colonoscopic  examination to the cecum.   PLAN:  Consider repeat examination in 5-10 years           ______________________________  Georgiana Spinner, M.D.     GMO/MEDQ  D:  04/07/2008  T:  04/07/2008  Job:  [id number]

## 2010-10-04 ENCOUNTER — Other Ambulatory Visit: Payer: Self-pay

## 2010-10-06 ENCOUNTER — Telehealth: Payer: Self-pay | Admitting: Internal Medicine

## 2010-10-06 MED ORDER — HYDROCODONE-HOMATROPINE 5-1.5 MG/5ML PO SYRP: 5.0000 mL | ORAL_SOLUTION | Freq: Four times a day (QID) | ORAL | Status: DC | PRN

## 2010-10-06 NOTE — Op Note (Signed)
NAMEANDRIAN, SABALA               ACCOUNT NO.:  [id number]   MEDICAL RECORD NO.:  [id number]          PATIENT TYPE:  AMB   LOCATION:  ENDO                         FACILITY:  [facility]   PHYSICIAN:  Georgiana Spinner, M.D.    DATE OF BIRTH:  12-Jan-1941   DATE OF PROCEDURE:  DATE OF DISCHARGE:                                 OPERATIVE REPORT   PROCEDURE:  Upper endoscopy.   INDICATIONS:  Abdominal discomfort, GERD symptoms possibly.   ANESTHESIA:  Fentanyl 50 mcg, Versed 4 mg.   DESCRIPTION OF THE PROCEDURE:  With the patient properly sedated in the left  lateral decubitus position, the Olympus videoscopic endoscope was inserted  into the mouth and passed under direct vision through the esophagus which  appeared normal.  There was some mild inflammation of the squamocolumnar  junction which was photographed and subsequently biopsied.  We entered into  the stomach.  The fundus, body, antrum, duodenal bulb, second portion of the  duodenum were visualized.  From this point, the endoscope was slowly  withdrawn, taking circumferential views of the duodenum until the endoscope  had been pulled back into the stomach, placed in retroflexion view of the  stomach from below.  The endoscope was then straightened and withdrawn, with  circumferential views of gastric and esophageal mucosa, stopping to  photograph and biopsy erythematous changes in the antrum and body of the  stomach.  The patient's vital signs, pulse oximeter remained stable.  The  patient tolerated the procedure well without apparent complications.   FINDINGS:  Question of Barrett's esophagus, gastritis.  Biopsied.  Will  await biopsy results.           ______________________________  Georgiana Spinner, M.D.     GMO/MEDQ  D:  11/28/2005  T:  11/28/2005  Job:  [id number]

## 2010-10-06 NOTE — Discharge Summary (Signed)
Keith [name], Keith [name]               ACCOUNT NO.:  [id number]   MEDICAL RECORD NO.:  [id number]          PATIENT TYPE:  INP   LOCATION:  1518                         FACILITY:  [facility]   PHYSICIAN:  Georges Lynch. Gioffre, M.D.DATE OF BIRTH:  05/03/1941   DATE OF ADMISSION:  09/28/2004  DATE OF DISCHARGE:  10/02/2004                                 DISCHARGE SUMMARY   ADMISSION DIAGNOSES:  1.  Degenerative arthritis, right knee.  2.  Hypertension.  3.  Gastric ulcers.  4.  Gastroesophageal reflux disease.  5.  Kidney stones.  6.  Seasonal allergies.  7.  Arthritis.   DISCHARGE DIAGNOSES:  1.  Degenerative arthritis right knee, status post right total knee      arthroplasty.  2.  Hypertension.  3.  Gastric ulcers.  4.  Gastroesophageal reflux disease.  5.  Kidney stones.  6.  Seasonal allergies.  7.  Arthritis.   OPERATION:  Right total knee arthroplasty.   SURGEON:  Ranee Gosselin, M.D.   ASSISTANT:  Durene Romans, M.D. and Terie Purser, P.A.C.   ANESTHESIA:  Spinal anesthesia.   DRAINS:  Hemovac drain was placed at the time of surgery.   LABORATORY DATA:  Admission CBC:  White blood cell count 10.4, red blood  cells 5.29. Hemoglobin and hematocrit 15.3 and 44.9. Platelet count 194,000.  PT 13.1, INR 1.0, PTT 29. Admission chemistry:  Sodium 138, potassium 3.6,  chloride 107, CO2 24, glucose 130 (elevated). BUN 16, creatinine 1.2,  calcium 8.6, total protein 6.2, albumin 3.7. Urinalysis normal. The  patient's blood type is O negative, negative antibody screen.   Admission EKG:  Normal sinus rhythm at a rate of 67.   Preoperative chest x-ray, no active disease. Preoperative x-ray of right  knee:  Degenerative arthritis of right knee. Postoperative x-ray of right  knee revealed satisfactory appearance of right total knee replacement.   HOSPITAL COURSE:  The patient was admitted to [facility].  He was taken to the operating room. He underwent the above  stated procedure  without complications. He tolerated the procedure well and was allowed to  return to the recovery room  and then to the orthopedic floor to continue  his postoperative care. A Hemovac drain was placed at the time of surgery,  that was discontinued on postoperative day 1. The patient's hemoglobin and  hematocrit was followed throughout his hospitalization. He had a  postoperative drop in his hemoglobin to 12.1 but that stabilized prior to  discharge. He did not require a blood transfusion. The patient was placed on  PC analgesic for pain control. The patient was well controlled on the PCA.  He was weaned to oral analgesics and the PCA was discontinued on  postoperative day 3. PT was consulted for gait training and ambulation. The  patient was able to ambulate with the aid of a walker and physical therapy  without much difficulty. He progressed. Was able to ambulate by  postoperative day 4.   DISPOSITION:  The patient progressed well and was allowed to be discharged  home on Oct 02, 2004, which  is the discharge date.   DISCHARGE MEDICATIONS:  1.  Percocet 10/650 1 to 2 every 6 hours as needed for pain.  2.  Coumadin 5 mg 1 tablet daily.  3.  Robaxin 500 mg 1 every 6 hours as needed for spasms.  The patient is to resume his home medications which include:  1.  Protonix 40 mg daily.  2.  Diltiazem 240 mg daily.  3.  Diovan 160 mg daily.   FOLLOW UP:  The patient will followup with Dr. Darrelyn Hillock 2 weeks from the date  of surgery.   ACTIVITY:  Full weight bearing with walker.   WOUND CARE:  He will do daily dressing changes.   FOLLOW UP:  The patient will call the office to schedule the appointment.   CONDITION ON DISCHARGE:  Stable.      Lisa   LKP/MEDQ  D:  11/01/2004  T:  11/01/2004  Job:  [id number]   cc:   Madlyn Frankel Charlann Boxer, M.D.  [facility]  [address]  [address]  [place]  [place] [address]  Fax: [phone]

## 2010-10-06 NOTE — Telephone Encounter (Signed)
LMTCB

## 2010-10-06 NOTE — Telephone Encounter (Signed)
Per CY--ok for hydromet  1 tsp every 6 hours prn cough with no refills.  Called and spoke with pt and he is aware of rx sent to the pharmacy

## 2010-10-06 NOTE — H&P (Signed)
NAME:  Keith [name], Keith [name]                         ACCOUNT NO.:  [id number]   MEDICAL RECORD NO.:  [id number]                   PATIENT TYPE:  INP   LOCATION:  NA                                   FACILITY:  [facility]   PHYSICIAN:  Carston [name]. Gioffre, M.D.             DATE OF BIRTH:  [birth year]   DATE OF ADMISSION:  DATE OF DISCHARGE:                                HISTORY & PHYSICAL   HISTORY OF PRESENT ILLNESS:  The patient has had bilateral knee pain for two  years, left greater then right.  He is having increasing pain with  ambulation.  He is no longer getting relief with non-steroidal anti-  inflammatory medications.  The patient had left knee arthroscopy in 1995,  which relieved his symptoms for several years.  Due to [name] progression of his  symptoms, he has elected to proceed with [name] left total knee arthroplasty.   ALLERGIES:  NOVOCAINE caused an elevated blood pressure.   PRIMARY CARE PHYSICIAN:  Janae Bridgeman. Lendell Caprice, M.D.   CURRENT MEDICATIONS:  1. Diltiazem one daily for blood pressure.  2. Diovan once daily.  3. Protonix one daily.   PAST MEDICAL HISTORY:  1. Hypertension.  2. Gastroesophageal reflux disease.   PAST SURGICAL HISTORY:  1. The patient had hernia repair in 1962.  2. Cataract surgery with implants in 2001.  3. Knee arthroscopy in 1995.   FAMILY HISTORY:  The patient has [name] father who died of an myocardial  infarction.  Father also had arthritis.   SOCIAL HISTORY:  The patient is retired, has one living child.  Single story  dwelling with one stair at the entrance.   REVIEW OF SYMPTOMS:  GENERAL:  Denies weight change, fever, chills, fatigue.  HEENT:  Denies headache, facial symptoms, tinnitus, hearing loss, or sore  throat.  CARDIOVASCULAR:  Denies chest pain, palpitations, shortness of  breath, or orthopnea.  PULMONARY:  Denies wheezing, dyspnea, sputum  production, cough, or hemoptysis.  GASTROINTESTINAL:  Denies dysphagia,  nausea, vomiting, hematemesis,  or abdominal pain.  GENITOURINARY:  Denies  dysuria, frequency, urgency, hematuria.  ENDOCRINE:  Denies polyuria,  polydipsia, appetite changes, heat or cold intolerance.  MUSCULOSKELETAL:  The patient has bilateral knee pain, left greater then right.  NEUROLOGIC:  Denies dizziness, vertigo, syncope, seizures.  SKIN:  Denies history of  rashes, masses, or moles.   PHYSICAL EXAMINATION:  VITAL SIGNS:  Temperature 99.3, pulse 84,  respirations 18, blood pressure 158/100.  GENERAL:  The patient is [name] 70 year old male in no acute distress.  HEENT:  Pupils equal, round, reactive to light.  Extraocular movements were  intact.  Throat is clear.  Tympanic membranes are intact.  NECK:  Supple without masses, no carotid bruits detected.  CHEST:  Clear to auscultation bilaterally.  No wheezes, rhonchi, or rales  noted.  HEART:  Regular rate and rhythm without murmurs, rubs, or gallops.  ABDOMEN:  Positive bowel  sounds, soft, nontender, no organomegaly or  abnormal masses.  EXTREMITIES:  Left knee reveals severe genu varus, lacks 15 to 20 degrees of  flexion, extends to neutral.  SKIN:  Warm and dry.   LABORATORY DATA:  X-ray of his left knee reveals complete collapse of the  medial joint space.   IMPRESSION:  Degenerative arthritis of the left knee.   PLAN:  The patient will be admitted to [facility] on 09/23/02, to  undergo left total knee arthroplasty.      Ebbie Ridge. Paitsel, P.[name].                     Demarques [name]. Darrelyn Hillock, M.D.    Tilden Dome  D:  09/22/2002  T:  09/22/2002  Job:  [id number]

## 2010-10-06 NOTE — Op Note (Signed)
NAME:  Keith [name], Keith [name]                         ACCOUNT NO.:  [id number]   MEDICAL RECORD NO.:  [id number]                   PATIENT TYPE:  INP   LOCATION:  0482                                 FACILITY:  [facility]   PHYSICIAN:  Georges Lynch. Darrelyn Hillock, M.D.             DATE OF BIRTH:  [birth year]   DATE OF PROCEDURE:  09/23/2002  DATE OF DISCHARGE:                                 OPERATIVE REPORT   SURGEON:  Georges Lynch. Darrelyn Hillock, M.D.   ASSISTANT:  Keith [name], P.[name].   PREOPERATIVE DIAGNOSIS:  Severe degenerative arthritis of the left knee.   POSTOPERATIVE DIAGNOSIS:  Severe degenerative arthritis of the left knee.   OPERATION:  Left total knee arthroplasty.  We utilized [employer] total knee.  I utilized [name] 26 mm patella, [name] size 9 tibial tray with [name] 12 mm thickness  insert, and [name] size 7 left femoral component.  I cemented all three  components, and vancomycin was used in the cement.   DESCRIPTION OF PROCEDURE:  Under spinal anesthesia, [name] routine orthopedic  prep and draping of the left lower extremity was carried out.  The leg was  exsanguinated with an Esmarch, and [name] tourniquet was elevated at 350 mmHg.  Midline incision was made over the anterior aspect of the left knee,  bleeders identified and cauterized.  Two flaps were created.  I then carried  out [name] median parapatellar incision, reflected the patella laterally, flexed  the knee.  I did lateral and medial meniscectomies.  At this time, I excised  the anterior and posterior cruciate ligaments.  Following this, the initial  drill hole was made in the intercondylar notch.  [name] #1 jig was inserted, and  I removed 10 mm thickness off the distal femur.  The #2 jig was inserted for  [name] size 7 left femur.  I carried out anterior, posterior, and chamfering  cuts.  After the femur was prepared, I then prepared the tibia in the usual  fashion by removing 4 mm thickness of the affected medial side.  The  intermedullary guide rod was used.  After  this was completed, we then cut  our patellar groove and our notch cut out of the distal femur for [name] size 7  left femur.  I then went through the trials.  We had good stability with the  12 mm thickness tibial insert.  I removed the trials.  I then cut my  patella.  I removed 10 mm thickness off the articular surface of the patella  for [name] size 26 recessed patella.  Three drill holes were made in the patella.  Three drill holes were made in the patella.  I then flexed the knee and cut  my keel cut out of the proximal tibial metaphysis.  At this particular  point, I then thoroughly waterpicked the knee, dried the knee out, and  cemented all three components in simultaneously.  Once  the cement was  hardened, I then went through and removed all of the loose pieces of cement.  I then went through the trials again.  We selected [name] 12 mm thickness tibial  insert.  Thoroughly waterpicked out the knee, dried the knee out, and  inserted my permanent 12 mm thickness tibial insert for the posterior  cruciate-sacrificing prosthesis.  After this was done, I then inserted my  Hemovac drain and closed the knee in layers in the usual fashion.  I  thoroughly irrigated out the knee.  The skin was finally closed with metal  staples.  [name] sterile Neosporin dressing was applied.  He did have one Hemovac  drain in place.  The patient had 1 g of IV Ancef preop.                                               Keith [name]. Darrelyn Hillock, M.D.    RAG/MEDQ  D:  09/23/2002  T:  09/23/2002  Job:  [id number]

## 2010-10-06 NOTE — H&P (Signed)
NAMEAKEEL, REFFNER               ACCOUNT NO.:  [id number]   MEDICAL RECORD NO.:  [id number]          PATIENT TYPE:   LOCATION:                                 FACILITY:   PHYSICIAN:  Georges Lynch. Gioffre, M.D.     DATE OF BIRTH:   DATE OF ADMISSION:  09/28/2004  DATE OF DISCHARGE:                                HISTORY & PHYSICAL   HISTORY:  The patient has had right knee pain for several months.  It has  gotten worse since January.  He has known degenerative arthritis in his  right knee.  The patient has had a left total knee arthroplasty in May of  2004.  He has done quite well since that time with good pain relief and  elects to proceed with a right total knee arthroplasty.   ALLERGIES:  PENICILLIN causes a rash.  ANCEF, which we used in the hospital  for the last knee replacement, cause a soreness in his mouth, so we will  avoid Ancef or penicillin.  NOVOCAIN causes elevation in his blood pressure.   PAST MEDICAL HISTORY:  Significant for hypertension, gastric ulcers,  gastroesophageal reflux disease, kidney stones, seasonal allergies, and  arthritis.   CURRENT MEDICATIONS:  1.  Protonix 40 mg daily.  2.  Diltiazem 240 mg daily.  3.  Diovan 160 mg daily.  4.  Aleve p.r.n.   PREVIOUS SURGICAL HISTORY:  The patient had left total knee arthroplasty in  May, 2004.   PRIMARY CARE Arif Amendola:  Janae Bridgeman. Lendell Caprice, M.D.   FAMILY HISTORY:  Not available.   REVIEW OF SYSTEMS:  GENERAL:  Denies weight change, fever, chills, fatigue.  HEENT:  Denies headache, visual changes, tinnitus, hearing loss, sore  throat.  CARDIOVASCULAR:  Denies chest pain, palpitations, shortness of breath,  orthopnea.  PULMONARY:  Denies dyspnea, wheezing, cough, sputum production, hemoptysis.  GASTROINTESTINAL:  Denies dysphagia, nausea, vomiting, hematemesis, or  abdominal pain.  GENITOURINARY :  Denies dysuria, frequency, urgency, hematuria.  ENDOCRINE:  Denies polyuria, polydipsia, appetite  changes, or cold  intolerance.  MUSCULOSKELETAL:  The patient has right knee pain.  NEUROLOGIC:  Denies dizziness, vertigo, syncope, seizures.  SKIN:  Denies itching, rashes, masses, or moles.   PHYSICAL EXAMINATION:  VITAL SIGNS:  Temperature 98.5, pulse 78,  respirations 18, blood pressure 124/80.  GENERAL:  A 70 year old male in no acute distress.  HEENT:  PERRL.  EOMs intact.  NECK:  Supple without masses.  CHEST:  Clear to auscultation bilaterally.  No wheezing, rubs, or rhonchi  noted.  HEART:  Regular rate and rhythm without murmur.  ABDOMEN:  Positive bowel sounds, soft, nontender.  EXTREMITIES:  Examination of his right knee is painful range of motion, some  crepitus with range of motion.  SKIN:  Warm and dry.   X-ray of his right knee shows collapse of the medial joint.   IMPRESSION:  Degenerative arthritis, right knee.   PLAN:  The patient is to be admitted to [facility] on Sep 28, 2004  to undergo a right total knee arthroplasty.      LKP/MEDQ  D:  09/21/2004  T:  09/21/2004  Job:  [id number]

## 2010-10-06 NOTE — Discharge Summary (Signed)
NAME:  Keith [name], Keith [name]                         ACCOUNT NO.:  [id number]   MEDICAL RECORD NO.:  [id number]                   PATIENT TYPE:  INP   LOCATION:  0482                                 FACILITY:  [facility]   PHYSICIAN:  Georges Lynch. Darrelyn Hillock, M.D.             DATE OF BIRTH:  02-15-1941   DATE OF ADMISSION:  09/23/2002  DATE OF DISCHARGE:  09/28/2002                                 DISCHARGE SUMMARY   ADMISSION DIAGNOSES:  1. Severe degenerative arthritis, left knee.  2. Hypertension.  3. Gastroesophageal reflux disease.   DISCHARGE DIAGNOSES:  1. Severe degenerative arthritis, left knee status post left total knee     arthroplasty.  2. Hypertension.  3. Gastroesophageal reflux disease.   PROCEDURE:  The patient was taken to the operative room on Sep 23, 2002 to  undergo [name] left total knee arthroplasty.   SURGEON:  Ranee Gosselin, M.D.   ASSISTANT:  Terie Purser, [facility]   DRAINS:  [name] Hemovac drain was placed at the time of surgery.   HISTORY OF PRESENT ILLNESS:  This is [name] 70 year old male who has had  bilateral knee pain for two years, left greater than right. He has had  increasing pain with ambulation. He is no longer getting relief with  nonsteroid anti-inflammatory medications. The patient had [name] left knee  arthroscopy in 1995, which relieved his symptoms for several years but due  to progression of his symptoms and his pain with ambulation, he elected to  proceed with [name] left total knee arthroplasty and was admitted to the hospital  for same.   LABORATORY DATA:  Preadmission CBC revealed WBC of 12.2, hemoglobin 16.4,  hematocrit 47.6, platelet count 204,000. Pre-admission chemistries  completely normal except for slightly elevated glucose at 130. Preadmission  urinalysis was normal. The patient's blood type is 0 negative. Negative  antibody screen.   DIAGNOSTIC IMPRESSION:  Preadmission x-ray of his left knee revealed  advanced osteoarthritis, primarily medial and  patellofemoral compartments.  Postoperative x-ray of his left knee revealed status post left knee  arthroplasty. I am unable to locate his preadmission chest x-ray or EKG in  his medical record.  The patient's hemoglobin and hematocrit was followed throughout his  hospitalization. He had [name] decrease in his hemoglobin to 11.7 before  stabilizing on postoperative day three.   HOSPITAL COURSE:  The patient was admitted to [facility] and taken  to the operating room. He underwent the above stated procedure without  complications. The patient tolerated the procedure well and was allowed to  return to the recovery room and then to the orthopedic floor to continue his  postoperative care. [name] Hemovac drain was placed at the time of surgery. It  was pulled on postoperative day one. The patient was placed on PCA as needed  for pain control following the surgery. The PCA was kept until postoperative  day three when the patient was weaned  over to oral analgesics. Hemoglobin  and hematocrit was followed closely. He had [name] slight drop in his hemoglobin  on postoperative day three to 11.7 before stabilizing. He did not require  blood transfusions. PT was consulted for gait training ambulation. The  patient did very well and was able to ambulate greater than 100 feet with  his walker by postoperative day five. He had discomfort with mouth soreness  following his surgery and was given some mouth wash to alleviate his  symptoms. He progressed very well and was able to be discharged home on  postoperative day five.   DISPOSITION:  The patient was discharged home on Sep 28, 2002.   DISCHARGE MEDICATIONS:  1. OxyContin 20 mg every 12 hours as needed for pain.  2. Robaxin 500 mg one every six hours as needed for muscle spasm.  3. Coumadin 4 mg daily.   DIET:  As tolerated.   ACTIVITY:  Total knee precautions. [facility] for home care. Full weight  bearing with walker.   FOLLOW UP:  The patient is  to follow up with Dr. Darrelyn Hillock in the office two  weeks from the date of his surgery.   CONDITION ON DISCHARGE:  Improved.     Ebbie Ridge. Paitsel, P.[name].                     Gavino [name]. Darrelyn Hillock, M.D.    Tilden Dome  D:  10/13/2002  T:  10/13/2002  Job:  [id number]   cc:   Windy Fast [name]. Darrelyn Hillock, M.D.  [address]  [place]  [place] [address]  Fax: [phone]

## 2010-10-06 NOTE — Telephone Encounter (Signed)
Spoke with pt.  He is c/o prod cough with clear sputum x 1 wk.  Denies any other complaints. Would like refill on hycodan. Pls advise if okay to refill thanks! Allergies  Allergen Reactions  . Procaine Hcl     REACTION: increased bp

## 2010-10-06 NOTE — Op Note (Signed)
NAME:  Keith [name], Keith [name]               ACCOUNT NO.:  [id number]   MEDICAL RECORD NO.:  [id number]          PATIENT TYPE:  INP   LOCATION:  0004                         FACILITY:  [facility]   PHYSICIAN:  Georges Lynch. Gioffre, M.D.DATE OF BIRTH:  08-29-1940   DATE OF PROCEDURE:  09/28/2004  DATE OF DISCHARGE:                                 OPERATIVE REPORT   SURGEON:  Georges Lynch. Darrelyn Hillock, M.D.   ASSISTANTS:  Madlyn Frankel. Charlann Boxer, M.D., Ebbie Ridge. Paitsel, P.A.   PREOPERATIVE DIAGNOSIS:  Severe degenerative arthritis, right knee, with a  genu varus deformity.   POSTOPERATIVE DIAGNOSIS:  Severe degenerative arthritis, right knee, with a  genu varus deformity.   OPERATION:  Right total knee arthroplasty, utilizing the DePuy rotating  platform system.  I utilized the Smart Set bone cement, 40 gm.  I utilized 2  gm of vancomycin in the cement.  The size of the prosthesis, the posterior  cruciate sacrificing-type prosthesis was used on the right.  It was a size  4.  The patella was a 3-peg size 38.  The tibial tray was a size 5 tibial  tray with a tibial insert rotating platform type, size 4.   PROCEDURE:  Under spinal anesthesia, routine orthopedic prepping and draping  in the right lower extremity was carried out.  The leg was exsanguinated and  esmarched, and the tourniquet was elevated at 350 mmHg.  At this time, an  incision was made over the anterior aspect of the right knee.  Bleeders were  identified and cauterized.  Two flaps were created.  A median parapatellar  approach was carried out.  The patella was reflected laterally.  The knee  was flexed, and I carried out medial and lateral meniscectomies and incised  the anterior and posterior cruciate ligaments.  I removed all of the  osteophytes from the femur, tibia, and patella.  At this time, we then  approached the femur first.  A drill hole was made in the intercondylar  notch.  A #1 jig was inserted.  We removed 10 mm thickness off of the  distal  femur.  Following this, a #2 jig was inserted.  We carried out our anterior  and posterior chamfer cuts for a size 4 femur.  We then cut our notch out in  the usual fashion as well for a size 4 femur.  Once the femur was prepared,  we then went down and prepared the tibia by removing approximately 6 mm from  the affected medial size.  We utilized the stylus for a size 6.  Once the  tibia cut was made, we then went on and inserted our trials and went through  a trial range of motion, selected a size 5 tibial tray with 10 mm thickness  insert and a size 4 femur.  Following that, we then cut our groove cut out  for the keel portion of the tibial tray.  We did that after we made our  initial drill hole in the tibia for the rotating platform-type tibial tray.  After the tibia was prepared, we then prepared a  patella.  We removed the  appropriate amount of patella by using the measuring calipers.  We cut the  patella as a resurfacing patella for a size 38 patella.  Three drill holes  were made in the intra-articular surface of the patella in the usual  fashion.  Once the components were all trialed, we then removed the trial  components and thoroughly water-picked out the knee, dried the knee out, and  then inserted the cement in the usual fashion.  Cemented all three  components in simultaneously.  All loose pieces of the cement were removed.  The knee was taken through a range of motion again with the 10 mm thickness  size 4 insert.  We selected a permanent 10 mm thickness, size 4 tibial  insert.  Note, this is for a rotating platform.  Once we inserted all of the  permanent prostheses, we then checked  again to make sure there were no loose pieces of cement.  We irrigated out  the knee, inserted a Hemovac drain and closed the knees in the layered usual  fashion.  Sterile dressings were applied.  The patient had 500 mg of  vancomycin preop.      RAG/MEDQ  D:  09/28/2004  T:   09/28/2004  Job:  [id number]

## 2010-12-20 ENCOUNTER — Other Ambulatory Visit (INDEPENDENT_AMBULATORY_CARE_PROVIDER_SITE_OTHER): Payer: Self-pay | Admitting: Otolaryngology

## 2010-12-26 ENCOUNTER — Ambulatory Visit
Admission: RE | Admit: 2010-12-26 | Discharge: 2010-12-26 | Disposition: A | Discharge status: Home / Self Care | Payer: BC Managed Care – PPO | Source: Ambulatory Visit | Attending: Otolaryngology | Admitting: Otolaryngology

## 2010-12-26 MED ORDER — IOHEXOL 300 MG/ML  SOLN: 75.0000 mL | Freq: Once | INTRAMUSCULAR | Status: AC | PRN

## 2011-01-29 ENCOUNTER — Other Ambulatory Visit: Payer: Self-pay | Admitting: Internal Medicine

## 2011-01-30 NOTE — Telephone Encounter (Signed)
Please advise if okay to refill. Thanks.  

## 2011-01-30 NOTE — Telephone Encounter (Signed)
Ok to refill this time 

## 2011-04-06 ENCOUNTER — Ambulatory Visit
Admission: RE | Admit: 2011-04-06 | Discharge: 2011-04-06 | Disposition: A | Discharge status: Home / Self Care | Payer: BC Managed Care – PPO | Source: Ambulatory Visit | Attending: Cardiology | Admitting: Cardiology

## 2011-04-06 DIAGNOSIS — N1330 Unspecified hydronephrosis: Secondary | ICD-10-CM

## 2011-06-10 DIAGNOSIS — J3489 Other specified disorders of nose and nasal sinuses: Secondary | ICD-10-CM | POA: Diagnosis not present

## 2011-06-10 DIAGNOSIS — R079 Chest pain, unspecified: Secondary | ICD-10-CM | POA: Diagnosis not present

## 2011-06-10 DIAGNOSIS — R05 Cough: Secondary | ICD-10-CM | POA: Diagnosis not present

## 2011-06-10 DIAGNOSIS — R059 Cough, unspecified: Secondary | ICD-10-CM | POA: Diagnosis not present

## 2011-06-15 DIAGNOSIS — J019 Acute sinusitis, unspecified: Secondary | ICD-10-CM | POA: Diagnosis not present

## 2011-06-15 DIAGNOSIS — M109 Gout, unspecified: Secondary | ICD-10-CM | POA: Diagnosis not present

## 2011-06-15 DIAGNOSIS — I10 Essential (primary) hypertension: Secondary | ICD-10-CM | POA: Diagnosis not present

## 2011-06-26 DIAGNOSIS — Z79899 Other long term (current) drug therapy: Secondary | ICD-10-CM | POA: Diagnosis not present

## 2011-06-26 DIAGNOSIS — M109 Gout, unspecified: Secondary | ICD-10-CM | POA: Diagnosis not present

## 2011-09-10 DIAGNOSIS — J309 Allergic rhinitis, unspecified: Secondary | ICD-10-CM | POA: Diagnosis not present

## 2011-09-10 DIAGNOSIS — IMO0002 Reserved for concepts with insufficient information to code with codable children: Secondary | ICD-10-CM | POA: Diagnosis not present

## 2011-09-10 DIAGNOSIS — M1710 Unilateral primary osteoarthritis, unspecified knee: Secondary | ICD-10-CM | POA: Diagnosis not present

## 2011-09-13 DIAGNOSIS — Z125 Encounter for screening for malignant neoplasm of prostate: Secondary | ICD-10-CM | POA: Diagnosis not present

## 2011-09-13 DIAGNOSIS — E7800 Pure hypercholesterolemia, unspecified: Secondary | ICD-10-CM | POA: Diagnosis not present

## 2011-09-13 DIAGNOSIS — I10 Essential (primary) hypertension: Secondary | ICD-10-CM | POA: Diagnosis not present

## 2011-09-19 DIAGNOSIS — E7800 Pure hypercholesterolemia, unspecified: Secondary | ICD-10-CM | POA: Diagnosis not present

## 2011-09-19 DIAGNOSIS — I10 Essential (primary) hypertension: Secondary | ICD-10-CM | POA: Diagnosis not present

## 2011-09-26 DIAGNOSIS — M109 Gout, unspecified: Secondary | ICD-10-CM | POA: Diagnosis not present

## 2011-09-26 DIAGNOSIS — R609 Edema, unspecified: Secondary | ICD-10-CM | POA: Diagnosis not present

## 2011-10-03 DIAGNOSIS — Z79899 Other long term (current) drug therapy: Secondary | ICD-10-CM | POA: Diagnosis not present

## 2011-10-03 DIAGNOSIS — M549 Dorsalgia, unspecified: Secondary | ICD-10-CM | POA: Diagnosis not present

## 2011-10-03 DIAGNOSIS — M109 Gout, unspecified: Secondary | ICD-10-CM | POA: Diagnosis not present

## 2011-10-03 DIAGNOSIS — R609 Edema, unspecified: Secondary | ICD-10-CM | POA: Diagnosis not present

## 2011-10-31 DIAGNOSIS — I10 Essential (primary) hypertension: Secondary | ICD-10-CM | POA: Diagnosis not present

## 2011-10-31 DIAGNOSIS — R9439 Abnormal result of other cardiovascular function study: Secondary | ICD-10-CM | POA: Diagnosis not present

## 2011-10-31 DIAGNOSIS — R0989 Other specified symptoms and signs involving the circulatory and respiratory systems: Secondary | ICD-10-CM | POA: Diagnosis not present

## 2011-11-08 DIAGNOSIS — Z79899 Other long term (current) drug therapy: Secondary | ICD-10-CM | POA: Diagnosis not present

## 2011-11-11 DIAGNOSIS — J329 Chronic sinusitis, unspecified: Secondary | ICD-10-CM | POA: Diagnosis not present

## 2012-01-08 DIAGNOSIS — J310 Chronic rhinitis: Secondary | ICD-10-CM | POA: Diagnosis not present

## 2012-01-08 DIAGNOSIS — J343 Hypertrophy of nasal turbinates: Secondary | ICD-10-CM | POA: Diagnosis not present

## 2012-01-29 DIAGNOSIS — I10 Essential (primary) hypertension: Secondary | ICD-10-CM | POA: Diagnosis not present

## 2012-01-29 DIAGNOSIS — J309 Allergic rhinitis, unspecified: Secondary | ICD-10-CM | POA: Diagnosis not present

## 2012-01-29 DIAGNOSIS — R109 Unspecified abdominal pain: Secondary | ICD-10-CM | POA: Diagnosis not present

## 2012-01-30 DIAGNOSIS — I10 Essential (primary) hypertension: Secondary | ICD-10-CM | POA: Diagnosis not present

## 2012-02-05 DIAGNOSIS — J343 Hypertrophy of nasal turbinates: Secondary | ICD-10-CM | POA: Diagnosis not present

## 2012-02-05 DIAGNOSIS — J310 Chronic rhinitis: Secondary | ICD-10-CM | POA: Diagnosis not present

## 2012-02-06 DIAGNOSIS — R109 Unspecified abdominal pain: Secondary | ICD-10-CM | POA: Diagnosis not present

## 2012-02-07 ENCOUNTER — Other Ambulatory Visit: Payer: Self-pay | Admitting: Internal Medicine

## 2012-02-07 DIAGNOSIS — R109 Unspecified abdominal pain: Secondary | ICD-10-CM

## 2012-02-12 ENCOUNTER — Ambulatory Visit
Admission: RE | Admit: 2012-02-12 | Discharge: 2012-02-12 | Disposition: A | Discharge status: Home / Self Care | Payer: BC Managed Care – PPO | Source: Ambulatory Visit | Attending: Internal Medicine | Admitting: Internal Medicine

## 2012-02-12 DIAGNOSIS — R109 Unspecified abdominal pain: Secondary | ICD-10-CM

## 2012-02-12 DIAGNOSIS — N289 Disorder of kidney and ureter, unspecified: Secondary | ICD-10-CM | POA: Diagnosis not present

## 2012-03-05 DIAGNOSIS — I10 Essential (primary) hypertension: Secondary | ICD-10-CM | POA: Diagnosis not present

## 2012-03-05 DIAGNOSIS — Z0000 Encounter for general adult medical examination without abnormal findings: Secondary | ICD-10-CM | POA: Diagnosis not present

## 2012-03-05 DIAGNOSIS — E7800 Pure hypercholesterolemia, unspecified: Secondary | ICD-10-CM | POA: Diagnosis not present

## 2012-03-05 DIAGNOSIS — M545 Low back pain, unspecified: Secondary | ICD-10-CM | POA: Diagnosis not present

## 2012-03-11 DIAGNOSIS — H3530 Unspecified macular degeneration: Secondary | ICD-10-CM | POA: Diagnosis not present

## 2012-03-24 DIAGNOSIS — M25569 Pain in unspecified knee: Secondary | ICD-10-CM | POA: Diagnosis not present

## 2012-03-24 DIAGNOSIS — M25519 Pain in unspecified shoulder: Secondary | ICD-10-CM | POA: Diagnosis not present

## 2012-03-24 DIAGNOSIS — M75120 Complete rotator cuff tear or rupture of unspecified shoulder, not specified as traumatic: Secondary | ICD-10-CM | POA: Diagnosis not present

## 2012-04-08 DIAGNOSIS — S46819A Strain of other muscles, fascia and tendons at shoulder and upper arm level, unspecified arm, initial encounter: Secondary | ICD-10-CM | POA: Diagnosis not present

## 2012-04-08 DIAGNOSIS — M19019 Primary osteoarthritis, unspecified shoulder: Secondary | ICD-10-CM | POA: Diagnosis not present

## 2012-06-06 DIAGNOSIS — J343 Hypertrophy of nasal turbinates: Secondary | ICD-10-CM | POA: Diagnosis not present

## 2012-06-06 DIAGNOSIS — J310 Chronic rhinitis: Secondary | ICD-10-CM | POA: Diagnosis not present

## 2012-06-25 DIAGNOSIS — J0100 Acute maxillary sinusitis, unspecified: Secondary | ICD-10-CM | POA: Diagnosis not present

## 2012-06-25 DIAGNOSIS — J343 Hypertrophy of nasal turbinates: Secondary | ICD-10-CM | POA: Diagnosis not present

## 2012-06-25 DIAGNOSIS — J310 Chronic rhinitis: Secondary | ICD-10-CM | POA: Diagnosis not present

## 2012-07-17 DIAGNOSIS — I10 Essential (primary) hypertension: Secondary | ICD-10-CM | POA: Diagnosis not present

## 2012-07-17 DIAGNOSIS — J309 Allergic rhinitis, unspecified: Secondary | ICD-10-CM | POA: Diagnosis not present

## 2012-07-17 DIAGNOSIS — R609 Edema, unspecified: Secondary | ICD-10-CM | POA: Diagnosis not present

## 2012-07-28 DIAGNOSIS — I10 Essential (primary) hypertension: Secondary | ICD-10-CM | POA: Diagnosis not present

## 2012-08-04 DIAGNOSIS — R079 Chest pain, unspecified: Secondary | ICD-10-CM | POA: Diagnosis not present

## 2012-08-04 DIAGNOSIS — R609 Edema, unspecified: Secondary | ICD-10-CM | POA: Diagnosis not present

## 2012-08-04 DIAGNOSIS — I10 Essential (primary) hypertension: Secondary | ICD-10-CM | POA: Diagnosis not present

## 2012-08-27 DIAGNOSIS — E7800 Pure hypercholesterolemia, unspecified: Secondary | ICD-10-CM | POA: Diagnosis not present

## 2012-08-27 DIAGNOSIS — J069 Acute upper respiratory infection, unspecified: Secondary | ICD-10-CM | POA: Diagnosis not present

## 2012-08-28 DIAGNOSIS — Z0000 Encounter for general adult medical examination without abnormal findings: Secondary | ICD-10-CM | POA: Diagnosis not present

## 2012-08-28 DIAGNOSIS — I10 Essential (primary) hypertension: Secondary | ICD-10-CM | POA: Diagnosis not present

## 2012-09-03 DIAGNOSIS — R059 Cough, unspecified: Secondary | ICD-10-CM | POA: Diagnosis not present

## 2012-09-03 DIAGNOSIS — Z0000 Encounter for general adult medical examination without abnormal findings: Secondary | ICD-10-CM | POA: Diagnosis not present

## 2012-09-03 DIAGNOSIS — J309 Allergic rhinitis, unspecified: Secondary | ICD-10-CM | POA: Diagnosis not present

## 2012-09-03 DIAGNOSIS — R05 Cough: Secondary | ICD-10-CM | POA: Diagnosis not present

## 2012-09-03 DIAGNOSIS — I10 Essential (primary) hypertension: Secondary | ICD-10-CM | POA: Diagnosis not present

## 2012-09-17 DIAGNOSIS — H3530 Unspecified macular degeneration: Secondary | ICD-10-CM | POA: Diagnosis not present

## 2012-11-05 DIAGNOSIS — R0609 Other forms of dyspnea: Secondary | ICD-10-CM | POA: Diagnosis not present

## 2012-11-05 DIAGNOSIS — E785 Hyperlipidemia, unspecified: Secondary | ICD-10-CM | POA: Diagnosis not present

## 2012-11-05 DIAGNOSIS — R079 Chest pain, unspecified: Secondary | ICD-10-CM | POA: Diagnosis not present

## 2012-11-05 DIAGNOSIS — R0989 Other specified symptoms and signs involving the circulatory and respiratory systems: Secondary | ICD-10-CM | POA: Diagnosis not present

## 2012-11-05 DIAGNOSIS — I10 Essential (primary) hypertension: Secondary | ICD-10-CM | POA: Diagnosis not present

## 2012-12-16 DIAGNOSIS — R079 Chest pain, unspecified: Secondary | ICD-10-CM | POA: Diagnosis not present

## 2012-12-16 DIAGNOSIS — I10 Essential (primary) hypertension: Secondary | ICD-10-CM | POA: Diagnosis not present

## 2012-12-24 DIAGNOSIS — J343 Hypertrophy of nasal turbinates: Secondary | ICD-10-CM | POA: Diagnosis not present

## 2012-12-24 DIAGNOSIS — J310 Chronic rhinitis: Secondary | ICD-10-CM | POA: Diagnosis not present

## 2013-01-06 DIAGNOSIS — I10 Essential (primary) hypertension: Secondary | ICD-10-CM | POA: Diagnosis not present

## 2013-02-16 DIAGNOSIS — H1044 Vernal conjunctivitis: Secondary | ICD-10-CM | POA: Diagnosis not present

## 2013-03-05 DIAGNOSIS — I10 Essential (primary) hypertension: Secondary | ICD-10-CM | POA: Diagnosis not present

## 2013-03-05 DIAGNOSIS — E7800 Pure hypercholesterolemia, unspecified: Secondary | ICD-10-CM | POA: Diagnosis not present

## 2013-03-05 DIAGNOSIS — I259 Chronic ischemic heart disease, unspecified: Secondary | ICD-10-CM | POA: Diagnosis not present

## 2013-03-05 DIAGNOSIS — Z125 Encounter for screening for malignant neoplasm of prostate: Secondary | ICD-10-CM | POA: Diagnosis not present

## 2013-03-17 DIAGNOSIS — H3530 Unspecified macular degeneration: Secondary | ICD-10-CM | POA: Diagnosis not present

## 2013-06-24 DIAGNOSIS — J343 Hypertrophy of nasal turbinates: Secondary | ICD-10-CM | POA: Diagnosis not present

## 2013-06-24 DIAGNOSIS — J310 Chronic rhinitis: Secondary | ICD-10-CM | POA: Diagnosis not present

## 2013-06-30 DIAGNOSIS — R918 Other nonspecific abnormal finding of lung field: Secondary | ICD-10-CM | POA: Diagnosis not present

## 2013-06-30 DIAGNOSIS — J9819 Other pulmonary collapse: Secondary | ICD-10-CM | POA: Diagnosis not present

## 2013-06-30 DIAGNOSIS — N644 Mastodynia: Secondary | ICD-10-CM | POA: Diagnosis not present

## 2013-07-30 DIAGNOSIS — N644 Mastodynia: Secondary | ICD-10-CM | POA: Diagnosis not present

## 2013-07-30 DIAGNOSIS — R6882 Decreased libido: Secondary | ICD-10-CM | POA: Diagnosis not present

## 2013-07-30 DIAGNOSIS — N529 Male erectile dysfunction, unspecified: Secondary | ICD-10-CM | POA: Diagnosis not present

## 2013-07-30 DIAGNOSIS — N62 Hypertrophy of breast: Secondary | ICD-10-CM | POA: Diagnosis not present

## 2013-09-01 DIAGNOSIS — R6882 Decreased libido: Secondary | ICD-10-CM | POA: Diagnosis not present

## 2013-09-01 DIAGNOSIS — Z125 Encounter for screening for malignant neoplasm of prostate: Secondary | ICD-10-CM | POA: Diagnosis not present

## 2013-09-01 DIAGNOSIS — I10 Essential (primary) hypertension: Secondary | ICD-10-CM | POA: Diagnosis not present

## 2013-09-04 ENCOUNTER — Other Ambulatory Visit: Payer: Self-pay | Admitting: Internal Medicine

## 2013-09-04 DIAGNOSIS — N62 Hypertrophy of breast: Secondary | ICD-10-CM

## 2013-09-09 DIAGNOSIS — E785 Hyperlipidemia, unspecified: Secondary | ICD-10-CM | POA: Diagnosis not present

## 2013-09-09 DIAGNOSIS — I10 Essential (primary) hypertension: Secondary | ICD-10-CM | POA: Diagnosis not present

## 2013-09-09 DIAGNOSIS — R9431 Abnormal electrocardiogram [ECG] [EKG]: Secondary | ICD-10-CM | POA: Diagnosis not present

## 2013-09-09 DIAGNOSIS — R0989 Other specified symptoms and signs involving the circulatory and respiratory systems: Secondary | ICD-10-CM | POA: Diagnosis not present

## 2013-09-09 DIAGNOSIS — R0609 Other forms of dyspnea: Secondary | ICD-10-CM | POA: Diagnosis not present

## 2013-09-11 ENCOUNTER — Other Ambulatory Visit: Payer: BC Managed Care – PPO

## 2013-09-16 ENCOUNTER — Other Ambulatory Visit: Payer: BC Managed Care – PPO

## 2013-10-22 DIAGNOSIS — I10 Essential (primary) hypertension: Secondary | ICD-10-CM | POA: Diagnosis not present

## 2013-10-22 DIAGNOSIS — R0989 Other specified symptoms and signs involving the circulatory and respiratory systems: Secondary | ICD-10-CM | POA: Diagnosis not present

## 2013-10-22 DIAGNOSIS — R0609 Other forms of dyspnea: Secondary | ICD-10-CM | POA: Diagnosis not present

## 2013-11-19 ENCOUNTER — Other Ambulatory Visit: Payer: BC Managed Care – PPO

## 2013-12-03 DIAGNOSIS — I10 Essential (primary) hypertension: Secondary | ICD-10-CM | POA: Diagnosis not present

## 2013-12-03 DIAGNOSIS — E785 Hyperlipidemia, unspecified: Secondary | ICD-10-CM | POA: Diagnosis not present

## 2014-04-27 DIAGNOSIS — H3531 Nonexudative age-related macular degeneration: Secondary | ICD-10-CM | POA: Diagnosis not present

## 2014-05-24 DIAGNOSIS — J029 Acute pharyngitis, unspecified: Secondary | ICD-10-CM | POA: Diagnosis not present

## 2014-05-24 DIAGNOSIS — I10 Essential (primary) hypertension: Secondary | ICD-10-CM | POA: Diagnosis not present

## 2014-05-24 DIAGNOSIS — Z8639 Personal history of other endocrine, nutritional and metabolic disease: Secondary | ICD-10-CM | POA: Diagnosis not present

## 2014-05-24 DIAGNOSIS — I2510 Atherosclerotic heart disease of native coronary artery without angina pectoris: Secondary | ICD-10-CM | POA: Diagnosis not present

## 2014-06-10 DIAGNOSIS — R0789 Other chest pain: Secondary | ICD-10-CM | POA: Diagnosis not present

## 2014-06-10 DIAGNOSIS — R0609 Other forms of dyspnea: Secondary | ICD-10-CM | POA: Diagnosis not present

## 2014-06-10 DIAGNOSIS — I10 Essential (primary) hypertension: Secondary | ICD-10-CM | POA: Diagnosis not present

## 2014-06-10 DIAGNOSIS — R0989 Other specified symptoms and signs involving the circulatory and respiratory systems: Secondary | ICD-10-CM | POA: Diagnosis not present

## 2014-06-14 DIAGNOSIS — J0101 Acute recurrent maxillary sinusitis: Secondary | ICD-10-CM | POA: Diagnosis not present

## 2014-06-14 DIAGNOSIS — J310 Chronic rhinitis: Secondary | ICD-10-CM | POA: Diagnosis not present

## 2014-06-14 DIAGNOSIS — H6122 Impacted cerumen, left ear: Secondary | ICD-10-CM | POA: Diagnosis not present

## 2014-06-14 DIAGNOSIS — J343 Hypertrophy of nasal turbinates: Secondary | ICD-10-CM | POA: Diagnosis not present

## 2014-07-07 DIAGNOSIS — J0101 Acute recurrent maxillary sinusitis: Secondary | ICD-10-CM | POA: Diagnosis not present

## 2014-07-07 DIAGNOSIS — R42 Dizziness and giddiness: Secondary | ICD-10-CM | POA: Diagnosis not present

## 2014-07-07 DIAGNOSIS — J310 Chronic rhinitis: Secondary | ICD-10-CM | POA: Diagnosis not present

## 2014-07-07 DIAGNOSIS — J343 Hypertrophy of nasal turbinates: Secondary | ICD-10-CM | POA: Diagnosis not present

## 2014-07-26 DIAGNOSIS — R600 Localized edema: Secondary | ICD-10-CM | POA: Diagnosis not present

## 2014-07-26 DIAGNOSIS — M545 Low back pain: Secondary | ICD-10-CM | POA: Diagnosis not present

## 2014-07-26 DIAGNOSIS — M5136 Other intervertebral disc degeneration, lumbar region: Secondary | ICD-10-CM | POA: Diagnosis not present

## 2014-07-26 DIAGNOSIS — M5137 Other intervertebral disc degeneration, lumbosacral region: Secondary | ICD-10-CM | POA: Diagnosis not present

## 2014-07-29 DIAGNOSIS — R609 Edema, unspecified: Secondary | ICD-10-CM | POA: Diagnosis not present

## 2014-07-29 DIAGNOSIS — M549 Dorsalgia, unspecified: Secondary | ICD-10-CM | POA: Diagnosis not present

## 2014-08-03 DIAGNOSIS — I10 Essential (primary) hypertension: Secondary | ICD-10-CM | POA: Diagnosis not present

## 2014-08-03 DIAGNOSIS — M545 Low back pain: Secondary | ICD-10-CM | POA: Diagnosis not present

## 2014-08-03 DIAGNOSIS — R05 Cough: Secondary | ICD-10-CM | POA: Diagnosis not present

## 2014-08-03 DIAGNOSIS — I2510 Atherosclerotic heart disease of native coronary artery without angina pectoris: Secondary | ICD-10-CM | POA: Diagnosis not present

## 2014-08-24 DIAGNOSIS — I10 Essential (primary) hypertension: Secondary | ICD-10-CM | POA: Diagnosis not present

## 2014-08-24 DIAGNOSIS — Z125 Encounter for screening for malignant neoplasm of prostate: Secondary | ICD-10-CM | POA: Diagnosis not present

## 2014-08-24 DIAGNOSIS — I2510 Atherosclerotic heart disease of native coronary artery without angina pectoris: Secondary | ICD-10-CM | POA: Diagnosis not present

## 2014-08-24 DIAGNOSIS — Z8639 Personal history of other endocrine, nutritional and metabolic disease: Secondary | ICD-10-CM | POA: Diagnosis not present

## 2014-09-08 ENCOUNTER — Other Ambulatory Visit: Payer: Self-pay | Admitting: Internal Medicine

## 2014-09-08 DIAGNOSIS — M545 Low back pain: Secondary | ICD-10-CM

## 2014-09-29 DIAGNOSIS — I2510 Atherosclerotic heart disease of native coronary artery without angina pectoris: Secondary | ICD-10-CM | POA: Diagnosis not present

## 2014-09-29 DIAGNOSIS — I10 Essential (primary) hypertension: Secondary | ICD-10-CM | POA: Diagnosis not present

## 2014-09-29 DIAGNOSIS — E780 Pure hypercholesterolemia: Secondary | ICD-10-CM | POA: Diagnosis not present

## 2014-10-05 ENCOUNTER — Ambulatory Visit
Admission: RE | Admit: 2014-10-05 | Discharge: 2014-10-05 | Disposition: A | Discharge status: Home / Self Care | Payer: BLUE CROSS/BLUE SHIELD | Source: Ambulatory Visit | Attending: Internal Medicine | Admitting: Internal Medicine

## 2014-10-05 DIAGNOSIS — M545 Low back pain: Secondary | ICD-10-CM

## 2015-01-10 DIAGNOSIS — R05 Cough: Secondary | ICD-10-CM | POA: Diagnosis not present

## 2015-01-10 DIAGNOSIS — K219 Gastro-esophageal reflux disease without esophagitis: Secondary | ICD-10-CM | POA: Diagnosis not present

## 2015-01-10 DIAGNOSIS — R490 Dysphonia: Secondary | ICD-10-CM | POA: Diagnosis not present

## 2015-01-27 DIAGNOSIS — I10 Essential (primary) hypertension: Secondary | ICD-10-CM | POA: Diagnosis not present

## 2015-02-02 DIAGNOSIS — I10 Essential (primary) hypertension: Secondary | ICD-10-CM | POA: Diagnosis not present

## 2015-02-02 DIAGNOSIS — R739 Hyperglycemia, unspecified: Secondary | ICD-10-CM | POA: Diagnosis not present

## 2015-02-02 DIAGNOSIS — Z23 Encounter for immunization: Secondary | ICD-10-CM | POA: Diagnosis not present

## 2015-02-02 DIAGNOSIS — I2510 Atherosclerotic heart disease of native coronary artery without angina pectoris: Secondary | ICD-10-CM | POA: Diagnosis not present

## 2015-02-02 DIAGNOSIS — M109 Gout, unspecified: Secondary | ICD-10-CM | POA: Diagnosis not present

## 2015-02-25 DIAGNOSIS — J029 Acute pharyngitis, unspecified: Secondary | ICD-10-CM | POA: Diagnosis not present

## 2015-03-18 DIAGNOSIS — M7731 Calcaneal spur, right foot: Secondary | ICD-10-CM | POA: Diagnosis not present

## 2015-03-18 DIAGNOSIS — M546 Pain in thoracic spine: Secondary | ICD-10-CM | POA: Diagnosis not present

## 2015-03-18 DIAGNOSIS — M79671 Pain in right foot: Secondary | ICD-10-CM | POA: Diagnosis not present

## 2015-03-18 DIAGNOSIS — R0989 Other specified symptoms and signs involving the circulatory and respiratory systems: Secondary | ICD-10-CM | POA: Diagnosis not present

## 2015-03-18 DIAGNOSIS — R0600 Dyspnea, unspecified: Secondary | ICD-10-CM | POA: Diagnosis not present

## 2015-03-18 DIAGNOSIS — K219 Gastro-esophageal reflux disease without esophagitis: Secondary | ICD-10-CM | POA: Diagnosis not present

## 2015-03-18 DIAGNOSIS — M5134 Other intervertebral disc degeneration, thoracic region: Secondary | ICD-10-CM | POA: Diagnosis not present

## 2015-03-24 ENCOUNTER — Other Ambulatory Visit: Payer: Self-pay | Admitting: Internal Medicine

## 2015-03-24 DIAGNOSIS — M545 Low back pain: Secondary | ICD-10-CM

## 2015-03-24 DIAGNOSIS — M722 Plantar fascial fibromatosis: Secondary | ICD-10-CM | POA: Diagnosis not present

## 2015-03-24 DIAGNOSIS — M7661 Achilles tendinitis, right leg: Secondary | ICD-10-CM | POA: Diagnosis not present

## 2015-04-05 DIAGNOSIS — R070 Pain in throat: Secondary | ICD-10-CM | POA: Diagnosis not present

## 2015-04-05 DIAGNOSIS — R1312 Dysphagia, oropharyngeal phase: Secondary | ICD-10-CM | POA: Diagnosis not present

## 2015-04-05 DIAGNOSIS — H9209 Otalgia, unspecified ear: Secondary | ICD-10-CM | POA: Diagnosis not present

## 2015-04-07 DIAGNOSIS — M722 Plantar fascial fibromatosis: Secondary | ICD-10-CM | POA: Diagnosis not present

## 2015-04-22 ENCOUNTER — Other Ambulatory Visit: Payer: BLUE CROSS/BLUE SHIELD

## 2015-04-25 ENCOUNTER — Other Ambulatory Visit: Payer: BLUE CROSS/BLUE SHIELD

## 2015-04-25 ENCOUNTER — Inpatient Hospital Stay
Admission: RE | Admit: 2015-04-25 | Discharge: 2015-04-25 | Disposition: A | Discharge status: Home / Self Care | Payer: BLUE CROSS/BLUE SHIELD | Source: Ambulatory Visit | Attending: Internal Medicine | Admitting: Internal Medicine

## 2015-04-28 DIAGNOSIS — I10 Essential (primary) hypertension: Secondary | ICD-10-CM | POA: Diagnosis not present

## 2015-04-28 DIAGNOSIS — R739 Hyperglycemia, unspecified: Secondary | ICD-10-CM | POA: Diagnosis not present

## 2015-05-04 DIAGNOSIS — M1990 Unspecified osteoarthritis, unspecified site: Secondary | ICD-10-CM | POA: Diagnosis not present

## 2015-05-04 DIAGNOSIS — I10 Essential (primary) hypertension: Secondary | ICD-10-CM | POA: Diagnosis not present

## 2015-05-04 DIAGNOSIS — E7800 Pure hypercholesterolemia, unspecified: Secondary | ICD-10-CM | POA: Diagnosis not present

## 2015-05-04 DIAGNOSIS — R609 Edema, unspecified: Secondary | ICD-10-CM | POA: Diagnosis not present

## 2015-05-11 ENCOUNTER — Other Ambulatory Visit: Payer: Self-pay

## 2015-05-24 ENCOUNTER — Ambulatory Visit: Payer: BLUE CROSS/BLUE SHIELD | Admitting: Gastroenterology

## 2015-05-31 DIAGNOSIS — M25512 Pain in left shoulder: Secondary | ICD-10-CM | POA: Diagnosis not present

## 2015-06-14 DIAGNOSIS — N183 Chronic kidney disease, stage 3 (moderate): Secondary | ICD-10-CM | POA: Diagnosis not present

## 2015-06-14 DIAGNOSIS — R0989 Other specified symptoms and signs involving the circulatory and respiratory systems: Secondary | ICD-10-CM | POA: Diagnosis not present

## 2015-06-14 DIAGNOSIS — I10 Essential (primary) hypertension: Secondary | ICD-10-CM | POA: Diagnosis not present

## 2015-06-14 DIAGNOSIS — R0609 Other forms of dyspnea: Secondary | ICD-10-CM | POA: Diagnosis not present

## 2015-07-18 ENCOUNTER — Ambulatory Visit (INDEPENDENT_AMBULATORY_CARE_PROVIDER_SITE_OTHER): Payer: BLUE CROSS/BLUE SHIELD | Admitting: Gastroenterology

## 2015-07-18 ENCOUNTER — Encounter: Payer: Self-pay | Admitting: Gastroenterology

## 2015-07-18 VITALS — BP 138/80 | HR 76 | Ht 68.0 in | Wt 228.0 lb

## 2015-07-18 DIAGNOSIS — K219 Gastro-esophageal reflux disease without esophagitis: Secondary | ICD-10-CM | POA: Diagnosis not present

## 2015-07-18 NOTE — Progress Notes (Signed)
    History of Present Illness: This is a 75 year old male referred by Pearson Grippe, MD for the evaluation of GERD. Relates many years of GERD that is very well controlled on pantoprazole. About once or twice a year he will have significant nocturnal reflux that is very bothersome. He has a chronic cough that is felt secondary to allergies. When he coughs excessively he notes pain along his costal margins. He underwent colonoscopy and EGD by Dr. Greggory Stallion or in November 2009. Colonoscopy showed internal hemorrhoids and was otherwise normal. The EGD was normal. Denies weight loss, abdominal pain, constipation, diarrhea, change in stool caliber, melena, hematochezia, nausea, vomiting, dysphagia, chest pain.  Review of Systems: Pertinent positive and negative review of systems were noted in the above HPI section. All other review of systems were otherwise negative.  Current Medications, Allergies, Past Medical History, Past Surgical History, Family History and Social History were reviewed in [employer] record.  Physical Exam: General: Well developed, well nourished, obese, no acute distress Head: Normocephalic and atraumatic Eyes:  sclerae anicteric, EOMI Ears: Normal auditory acuity Mouth: No deformity or lesions Neck: Supple, no masses or thyromegaly Lungs: Clear throughout to auscultation Heart: Regular rate and rhythm; no murmurs, rubs or bruits Abdomen: Soft, non tender and non distended. No masses, hepatosplenomegaly or hernias noted. Normal Bowel sounds Musculoskeletal: Symmetrical with no gross deformities  Skin: No lesions on visible extremities Pulses:  Normal pulses noted Extremities: No clubbing, cyanosis, edema or deformities noted Neurological: Alert oriented x 4, grossly nonfocal Cervical Nodes:  No significant cervical adenopathy Inguinal Nodes: No significant inguinal adenopathy Psychological:  Alert and cooperative. Normal mood and affect  Assessment and  Recommendations:  1. GERD. Generally very well controlled with infrequent nocturnal reflux, regurgitation. Continue pantoprazole 40 mg daily. Closely follow all antireflux measures especially eating a light meal in the evening and no food at all for at least 3 hours before bedtime. He is strongly advised to begin the use of 4 inch bed blocks to help prevent nocturnal regurgitation. No plans to repeat his EGD at this time.  2. CRC screening, average risk. Consider colonoscopy at 10 year interval in November 2019 however will be 75 years old at the time and he states that he may not want to proceed.   cc: Pearson Grippe, MD

## 2015-07-18 NOTE — Patient Instructions (Signed)
Patient advised to avoid spicy, acidic, citrus, chocolate, mints, fruit and fruit juices.  Limit the intake of caffeine, alcohol and Soda.  Don't exercise too soon after eating.  Don't lie down within 3-4 hours of eating.  Elevate the head of your bed.  You use 4 " bed blocks to elevate your bed.   You will be due for a recall colonoscopy in 03/2018. We will send you a reminder in the mail when it gets closer to that time.   Thank you for choosing me and [facility].  Venita Lick. Pleas Koch., MD., Clementeen Graham  cc: Pearson Grippe, MD

## 2015-07-27 DIAGNOSIS — R0609 Other forms of dyspnea: Secondary | ICD-10-CM | POA: Diagnosis not present

## 2015-07-27 DIAGNOSIS — R0989 Other specified symptoms and signs involving the circulatory and respiratory systems: Secondary | ICD-10-CM | POA: Diagnosis not present

## 2015-07-27 DIAGNOSIS — N183 Chronic kidney disease, stage 3 (moderate): Secondary | ICD-10-CM | POA: Diagnosis not present

## 2015-07-27 DIAGNOSIS — I10 Essential (primary) hypertension: Secondary | ICD-10-CM | POA: Diagnosis not present

## 2015-09-07 DIAGNOSIS — R0989 Other specified symptoms and signs involving the circulatory and respiratory systems: Secondary | ICD-10-CM | POA: Diagnosis not present

## 2015-09-07 DIAGNOSIS — R0609 Other forms of dyspnea: Secondary | ICD-10-CM | POA: Diagnosis not present

## 2015-09-07 DIAGNOSIS — I10 Essential (primary) hypertension: Secondary | ICD-10-CM | POA: Diagnosis not present

## 2015-09-07 DIAGNOSIS — N183 Chronic kidney disease, stage 3 (moderate): Secondary | ICD-10-CM | POA: Diagnosis not present

## 2015-09-14 DIAGNOSIS — M25512 Pain in left shoulder: Secondary | ICD-10-CM | POA: Diagnosis not present

## 2015-09-14 DIAGNOSIS — M25531 Pain in right wrist: Secondary | ICD-10-CM | POA: Diagnosis not present

## 2015-10-05 DIAGNOSIS — M19012 Primary osteoarthritis, left shoulder: Secondary | ICD-10-CM | POA: Diagnosis not present

## 2015-10-05 DIAGNOSIS — M75122 Complete rotator cuff tear or rupture of left shoulder, not specified as traumatic: Secondary | ICD-10-CM | POA: Diagnosis not present

## 2015-10-27 DIAGNOSIS — I10 Essential (primary) hypertension: Secondary | ICD-10-CM | POA: Diagnosis not present

## 2015-10-27 DIAGNOSIS — E7800 Pure hypercholesterolemia, unspecified: Secondary | ICD-10-CM | POA: Diagnosis not present

## 2015-11-02 DIAGNOSIS — I2510 Atherosclerotic heart disease of native coronary artery without angina pectoris: Secondary | ICD-10-CM | POA: Diagnosis not present

## 2015-11-02 DIAGNOSIS — R739 Hyperglycemia, unspecified: Secondary | ICD-10-CM | POA: Diagnosis not present

## 2015-11-02 DIAGNOSIS — E7800 Pure hypercholesterolemia, unspecified: Secondary | ICD-10-CM | POA: Diagnosis not present

## 2015-11-02 DIAGNOSIS — I10 Essential (primary) hypertension: Secondary | ICD-10-CM | POA: Diagnosis not present

## 2015-11-03 DIAGNOSIS — M25512 Pain in left shoulder: Secondary | ICD-10-CM | POA: Diagnosis not present

## 2015-11-03 DIAGNOSIS — S43432D Superior glenoid labrum lesion of left shoulder, subsequent encounter: Secondary | ICD-10-CM | POA: Diagnosis not present

## 2015-11-16 DIAGNOSIS — R040 Epistaxis: Secondary | ICD-10-CM | POA: Diagnosis not present

## 2015-11-16 DIAGNOSIS — R05 Cough: Secondary | ICD-10-CM | POA: Diagnosis not present

## 2015-11-16 DIAGNOSIS — K219 Gastro-esophageal reflux disease without esophagitis: Secondary | ICD-10-CM | POA: Diagnosis not present

## 2015-12-21 DIAGNOSIS — R040 Epistaxis: Secondary | ICD-10-CM | POA: Diagnosis not present

## 2015-12-21 DIAGNOSIS — K219 Gastro-esophageal reflux disease without esophagitis: Secondary | ICD-10-CM | POA: Diagnosis not present

## 2015-12-21 DIAGNOSIS — R05 Cough: Secondary | ICD-10-CM | POA: Diagnosis not present

## 2015-12-21 DIAGNOSIS — R490 Dysphonia: Secondary | ICD-10-CM | POA: Diagnosis not present

## 2016-02-27 DIAGNOSIS — N390 Urinary tract infection, site not specified: Secondary | ICD-10-CM | POA: Diagnosis not present

## 2016-02-27 DIAGNOSIS — R739 Hyperglycemia, unspecified: Secondary | ICD-10-CM | POA: Diagnosis not present

## 2016-02-27 DIAGNOSIS — M549 Dorsalgia, unspecified: Secondary | ICD-10-CM | POA: Diagnosis not present

## 2016-02-27 DIAGNOSIS — E7800 Pure hypercholesterolemia, unspecified: Secondary | ICD-10-CM | POA: Diagnosis not present

## 2016-02-27 DIAGNOSIS — I10 Essential (primary) hypertension: Secondary | ICD-10-CM | POA: Diagnosis not present

## 2016-03-01 DIAGNOSIS — M545 Low back pain: Secondary | ICD-10-CM | POA: Diagnosis not present

## 2016-03-01 DIAGNOSIS — R351 Nocturia: Secondary | ICD-10-CM | POA: Diagnosis not present

## 2016-03-22 DIAGNOSIS — R072 Precordial pain: Secondary | ICD-10-CM | POA: Diagnosis not present

## 2016-03-22 DIAGNOSIS — R109 Unspecified abdominal pain: Secondary | ICD-10-CM | POA: Diagnosis not present

## 2016-03-22 DIAGNOSIS — R079 Chest pain, unspecified: Secondary | ICD-10-CM | POA: Diagnosis not present

## 2016-03-22 DIAGNOSIS — M545 Low back pain: Secondary | ICD-10-CM | POA: Diagnosis not present

## 2016-06-19 DIAGNOSIS — J209 Acute bronchitis, unspecified: Secondary | ICD-10-CM | POA: Diagnosis not present

## 2016-09-25 DIAGNOSIS — R0609 Other forms of dyspnea: Secondary | ICD-10-CM | POA: Diagnosis not present

## 2016-09-25 DIAGNOSIS — R0989 Other specified symptoms and signs involving the circulatory and respiratory systems: Secondary | ICD-10-CM | POA: Diagnosis not present

## 2016-09-25 DIAGNOSIS — I10 Essential (primary) hypertension: Secondary | ICD-10-CM | POA: Diagnosis not present

## 2016-09-25 DIAGNOSIS — N183 Chronic kidney disease, stage 3 (moderate): Secondary | ICD-10-CM | POA: Diagnosis not present

## 2016-10-31 DIAGNOSIS — I10 Essential (primary) hypertension: Secondary | ICD-10-CM | POA: Diagnosis not present

## 2016-10-31 DIAGNOSIS — R600 Localized edema: Secondary | ICD-10-CM | POA: Diagnosis not present

## 2016-10-31 DIAGNOSIS — N183 Chronic kidney disease, stage 3 (moderate): Secondary | ICD-10-CM | POA: Diagnosis not present

## 2016-11-05 DIAGNOSIS — R0981 Nasal congestion: Secondary | ICD-10-CM | POA: Diagnosis not present

## 2016-11-05 DIAGNOSIS — S39012D Strain of muscle, fascia and tendon of lower back, subsequent encounter: Secondary | ICD-10-CM | POA: Diagnosis not present

## 2016-11-05 DIAGNOSIS — R05 Cough: Secondary | ICD-10-CM | POA: Diagnosis not present

## 2016-11-20 DIAGNOSIS — I10 Essential (primary) hypertension: Secondary | ICD-10-CM | POA: Diagnosis not present

## 2016-11-20 DIAGNOSIS — R739 Hyperglycemia, unspecified: Secondary | ICD-10-CM | POA: Diagnosis not present

## 2016-11-20 DIAGNOSIS — R109 Unspecified abdominal pain: Secondary | ICD-10-CM | POA: Diagnosis not present

## 2016-11-22 ENCOUNTER — Other Ambulatory Visit: Payer: Self-pay | Admitting: Internal Medicine

## 2016-11-22 DIAGNOSIS — R109 Unspecified abdominal pain: Secondary | ICD-10-CM

## 2016-11-23 ENCOUNTER — Ambulatory Visit
Admission: RE | Admit: 2016-11-23 | Discharge: 2016-11-23 | Disposition: A | Discharge status: Home / Self Care | Payer: Medicare Other | Source: Ambulatory Visit | Attending: Internal Medicine | Admitting: Internal Medicine

## 2016-11-23 DIAGNOSIS — R109 Unspecified abdominal pain: Secondary | ICD-10-CM

## 2016-11-23 DIAGNOSIS — N281 Cyst of kidney, acquired: Secondary | ICD-10-CM | POA: Diagnosis not present

## 2016-11-23 MED ORDER — IOPAMIDOL (ISOVUE-300) INJECTION 61%
125.0000 mL | Freq: Once | INTRAVENOUS | Status: AC | PRN
  Administered 2016-11-23: 125 mL via INTRAVENOUS

## 2016-12-10 DIAGNOSIS — M546 Pain in thoracic spine: Secondary | ICD-10-CM | POA: Diagnosis not present

## 2016-12-10 DIAGNOSIS — J301 Allergic rhinitis due to pollen: Secondary | ICD-10-CM | POA: Diagnosis not present

## 2016-12-10 DIAGNOSIS — G8929 Other chronic pain: Secondary | ICD-10-CM | POA: Diagnosis not present

## 2016-12-10 DIAGNOSIS — R739 Hyperglycemia, unspecified: Secondary | ICD-10-CM | POA: Diagnosis not present

## 2016-12-11 ENCOUNTER — Other Ambulatory Visit: Payer: Self-pay | Admitting: Internal Medicine

## 2016-12-11 DIAGNOSIS — M546 Pain in thoracic spine: Secondary | ICD-10-CM

## 2016-12-14 DIAGNOSIS — M545 Low back pain: Secondary | ICD-10-CM | POA: Diagnosis not present

## 2017-01-22 DIAGNOSIS — M545 Low back pain: Secondary | ICD-10-CM | POA: Diagnosis not present

## 2017-01-29 DIAGNOSIS — J0111 Acute recurrent frontal sinusitis: Secondary | ICD-10-CM | POA: Diagnosis not present

## 2017-01-29 DIAGNOSIS — J0101 Acute recurrent maxillary sinusitis: Secondary | ICD-10-CM | POA: Diagnosis not present

## 2017-01-29 DIAGNOSIS — H6983 Other specified disorders of Eustachian tube, bilateral: Secondary | ICD-10-CM | POA: Diagnosis not present

## 2017-01-29 DIAGNOSIS — H903 Sensorineural hearing loss, bilateral: Secondary | ICD-10-CM | POA: Diagnosis not present

## 2017-01-29 DIAGNOSIS — J310 Chronic rhinitis: Secondary | ICD-10-CM | POA: Diagnosis not present

## 2017-02-14 DIAGNOSIS — M5136 Other intervertebral disc degeneration, lumbar region: Secondary | ICD-10-CM | POA: Diagnosis not present

## 2017-03-05 DIAGNOSIS — Z23 Encounter for immunization: Secondary | ICD-10-CM | POA: Diagnosis not present

## 2017-03-12 DIAGNOSIS — H353132 Nonexudative age-related macular degeneration, bilateral, intermediate dry stage: Secondary | ICD-10-CM | POA: Diagnosis not present

## 2017-05-27 DIAGNOSIS — I10 Essential (primary) hypertension: Secondary | ICD-10-CM | POA: Diagnosis not present

## 2017-05-27 DIAGNOSIS — E7800 Pure hypercholesterolemia, unspecified: Secondary | ICD-10-CM | POA: Diagnosis not present

## 2017-05-27 DIAGNOSIS — Z125 Encounter for screening for malignant neoplasm of prostate: Secondary | ICD-10-CM | POA: Diagnosis not present

## 2017-05-28 DIAGNOSIS — K219 Gastro-esophageal reflux disease without esophagitis: Secondary | ICD-10-CM | POA: Diagnosis not present

## 2017-05-28 DIAGNOSIS — J310 Chronic rhinitis: Secondary | ICD-10-CM | POA: Diagnosis not present

## 2017-05-28 DIAGNOSIS — R05 Cough: Secondary | ICD-10-CM | POA: Diagnosis not present

## 2017-05-28 DIAGNOSIS — J343 Hypertrophy of nasal turbinates: Secondary | ICD-10-CM | POA: Diagnosis not present

## 2017-06-03 DIAGNOSIS — M549 Dorsalgia, unspecified: Secondary | ICD-10-CM | POA: Diagnosis not present

## 2017-06-03 DIAGNOSIS — Z125 Encounter for screening for malignant neoplasm of prostate: Secondary | ICD-10-CM | POA: Diagnosis not present

## 2017-06-03 DIAGNOSIS — E7800 Pure hypercholesterolemia, unspecified: Secondary | ICD-10-CM | POA: Diagnosis not present

## 2017-06-03 DIAGNOSIS — I2510 Atherosclerotic heart disease of native coronary artery without angina pectoris: Secondary | ICD-10-CM | POA: Diagnosis not present

## 2017-06-03 DIAGNOSIS — I10 Essential (primary) hypertension: Secondary | ICD-10-CM | POA: Diagnosis not present

## 2017-06-03 DIAGNOSIS — Z0001 Encounter for general adult medical examination with abnormal findings: Secondary | ICD-10-CM | POA: Diagnosis not present

## 2017-06-21 DIAGNOSIS — J209 Acute bronchitis, unspecified: Secondary | ICD-10-CM | POA: Diagnosis not present

## 2017-06-21 DIAGNOSIS — J1089 Influenza due to other identified influenza virus with other manifestations: Secondary | ICD-10-CM | POA: Diagnosis not present

## 2017-06-27 DIAGNOSIS — J209 Acute bronchitis, unspecified: Secondary | ICD-10-CM | POA: Diagnosis not present

## 2017-06-27 DIAGNOSIS — B0089 Other herpesviral infection: Secondary | ICD-10-CM | POA: Diagnosis not present

## 2017-07-02 DIAGNOSIS — B001 Herpesviral vesicular dermatitis: Secondary | ICD-10-CM | POA: Diagnosis not present

## 2017-09-25 DIAGNOSIS — H1013 Acute atopic conjunctivitis, bilateral: Secondary | ICD-10-CM | POA: Diagnosis not present

## 2017-11-19 DIAGNOSIS — H9313 Tinnitus, bilateral: Secondary | ICD-10-CM | POA: Diagnosis not present

## 2017-11-19 DIAGNOSIS — H9209 Otalgia, unspecified ear: Secondary | ICD-10-CM | POA: Diagnosis not present

## 2017-11-19 DIAGNOSIS — J310 Chronic rhinitis: Secondary | ICD-10-CM | POA: Diagnosis not present

## 2017-11-19 DIAGNOSIS — H903 Sensorineural hearing loss, bilateral: Secondary | ICD-10-CM | POA: Diagnosis not present

## 2017-11-19 DIAGNOSIS — H838X3 Other specified diseases of inner ear, bilateral: Secondary | ICD-10-CM | POA: Diagnosis not present

## 2017-11-25 DIAGNOSIS — R739 Hyperglycemia, unspecified: Secondary | ICD-10-CM | POA: Diagnosis not present

## 2017-11-25 DIAGNOSIS — E7800 Pure hypercholesterolemia, unspecified: Secondary | ICD-10-CM | POA: Diagnosis not present

## 2017-11-25 DIAGNOSIS — I10 Essential (primary) hypertension: Secondary | ICD-10-CM | POA: Diagnosis not present

## 2017-12-02 DIAGNOSIS — E7800 Pure hypercholesterolemia, unspecified: Secondary | ICD-10-CM | POA: Diagnosis not present

## 2017-12-02 DIAGNOSIS — I2510 Atherosclerotic heart disease of native coronary artery without angina pectoris: Secondary | ICD-10-CM | POA: Diagnosis not present

## 2017-12-02 DIAGNOSIS — I10 Essential (primary) hypertension: Secondary | ICD-10-CM | POA: Diagnosis not present

## 2017-12-02 DIAGNOSIS — M549 Dorsalgia, unspecified: Secondary | ICD-10-CM | POA: Diagnosis not present

## 2017-12-02 DIAGNOSIS — R0981 Nasal congestion: Secondary | ICD-10-CM | POA: Diagnosis not present

## 2017-12-20 DIAGNOSIS — I10 Essential (primary) hypertension: Secondary | ICD-10-CM | POA: Diagnosis not present

## 2017-12-20 DIAGNOSIS — R600 Localized edema: Secondary | ICD-10-CM | POA: Diagnosis not present

## 2017-12-20 DIAGNOSIS — N183 Chronic kidney disease, stage 3 (moderate): Secondary | ICD-10-CM | POA: Diagnosis not present

## 2017-12-26 DIAGNOSIS — R399 Unspecified symptoms and signs involving the genitourinary system: Secondary | ICD-10-CM | POA: Diagnosis not present

## 2017-12-26 DIAGNOSIS — R3129 Other microscopic hematuria: Secondary | ICD-10-CM | POA: Diagnosis not present

## 2017-12-27 DIAGNOSIS — N200 Calculus of kidney: Secondary | ICD-10-CM | POA: Diagnosis not present

## 2017-12-27 DIAGNOSIS — R310 Gross hematuria: Secondary | ICD-10-CM | POA: Diagnosis not present

## 2018-02-13 DIAGNOSIS — M109 Gout, unspecified: Secondary | ICD-10-CM | POA: Diagnosis not present

## 2018-02-13 DIAGNOSIS — M545 Low back pain: Secondary | ICD-10-CM | POA: Diagnosis not present

## 2018-02-13 DIAGNOSIS — Z5181 Encounter for therapeutic drug level monitoring: Secondary | ICD-10-CM | POA: Diagnosis not present

## 2018-02-14 DIAGNOSIS — Z23 Encounter for immunization: Secondary | ICD-10-CM | POA: Diagnosis not present

## 2018-03-03 DIAGNOSIS — H1013 Acute atopic conjunctivitis, bilateral: Secondary | ICD-10-CM | POA: Diagnosis not present

## 2018-04-01 DIAGNOSIS — R05 Cough: Secondary | ICD-10-CM | POA: Diagnosis not present

## 2018-04-01 DIAGNOSIS — J069 Acute upper respiratory infection, unspecified: Secondary | ICD-10-CM | POA: Diagnosis not present

## 2018-04-01 DIAGNOSIS — I10 Essential (primary) hypertension: Secondary | ICD-10-CM | POA: Diagnosis not present

## 2018-05-25 ENCOUNTER — Encounter: Payer: Self-pay | Admitting: Gastroenterology

## 2018-05-28 DIAGNOSIS — E7800 Pure hypercholesterolemia, unspecified: Secondary | ICD-10-CM | POA: Diagnosis not present

## 2018-05-28 DIAGNOSIS — R739 Hyperglycemia, unspecified: Secondary | ICD-10-CM | POA: Diagnosis not present

## 2018-05-28 DIAGNOSIS — M546 Pain in thoracic spine: Secondary | ICD-10-CM | POA: Diagnosis not present

## 2018-05-28 DIAGNOSIS — Z5181 Encounter for therapeutic drug level monitoring: Secondary | ICD-10-CM | POA: Diagnosis not present

## 2018-05-28 DIAGNOSIS — Z125 Encounter for screening for malignant neoplasm of prostate: Secondary | ICD-10-CM | POA: Diagnosis not present

## 2018-06-09 DIAGNOSIS — M545 Low back pain: Secondary | ICD-10-CM | POA: Diagnosis not present

## 2018-06-09 DIAGNOSIS — Z125 Encounter for screening for malignant neoplasm of prostate: Secondary | ICD-10-CM | POA: Diagnosis not present

## 2018-06-09 DIAGNOSIS — Z0000 Encounter for general adult medical examination without abnormal findings: Secondary | ICD-10-CM | POA: Diagnosis not present

## 2018-06-09 DIAGNOSIS — M546 Pain in thoracic spine: Secondary | ICD-10-CM | POA: Diagnosis not present

## 2018-06-09 DIAGNOSIS — R739 Hyperglycemia, unspecified: Secondary | ICD-10-CM | POA: Diagnosis not present

## 2018-06-09 DIAGNOSIS — E7800 Pure hypercholesterolemia, unspecified: Secondary | ICD-10-CM | POA: Diagnosis not present

## 2018-06-25 ENCOUNTER — Ambulatory Visit: Payer: Self-pay | Admitting: Cardiology

## 2018-06-27 ENCOUNTER — Ambulatory Visit: Payer: Self-pay | Admitting: Cardiology

## 2018-06-27 DIAGNOSIS — E66811 Obesity, class 1: Secondary | ICD-10-CM | POA: Insufficient documentation

## 2018-06-27 DIAGNOSIS — E669 Obesity, unspecified: Secondary | ICD-10-CM | POA: Insufficient documentation

## 2018-06-27 NOTE — Progress Notes (Deleted)
Subjective:   @Patient  ID: Keith [name], male    DOB: [birth year], 78 y.o.   MRN: [id number]  No chief complaint on file.   HPI [name] is a pleasant Caucasian male with history of hypertension, hyperlipidemia. He has had difficulty in BP control. His past medical history significant for chronic leg edema, chronic dyspnea on exertion, mild hyperlipidemiamoderate obesity and stage III chronic kidney disease. He has had a abnormal nuclear stress test in 2011 with inferior ischemia however has been essentially asymptomatic without any further chest pain and has been on medical therapy.  He has not had any recent cardiac evaluations since,   Past Medical History:  Diagnosis Date  . Allergic rhinitis, cause unspecified    skin test pos 02-23-2008  . Angina pectoris (HCC)   . CAD (coronary artery disease)   . Cough   . GERD (gastroesophageal reflux disease)   . Ischemia   . Migraine   . Nephrolithiasis   . TIA (transient ischemic attack)   . Unspecified essential hypertension     Past Surgical History:  Procedure Laterality Date  . bilateral tkr    . HERNIA REPAIR    . reset nasal fracture    . TONSILLECTOMY      Social History   Socioeconomic History  . Marital status: Married    Spouse name: Not on file  . Number of children: 1  . Years of education: Not on file  . Highest education level: Not on file  Occupational History  . Occupation: retired  . Occupation: works part Presenter, broadcasting Needs  . Financial resource strain: Not on file  . Food insecurity:    Worry: Not on file    Inability: Not on file  . Transportation needs:    Medical: Not on file    Non-medical: Not on file  Tobacco Use  . Smoking status: Never Smoker  Substance and Sexual Activity  . Alcohol use: No    Alcohol/week: 0.0 standard drinks    Comment: used to drink alcohol, 2 5ths a week  . Drug use: No  . Sexual activity: Not on file  Lifestyle  . Physical activity:   Days per week: Not on file    Minutes per session: Not on file  . Stress: Not on file  Relationships  . Social connections:    Talks on phone: Not on file    Gets together: Not on file    Attends religious service: Not on file    Active member of club or organization: Not on file    Attends meetings of clubs or organizations: Not on file    Relationship status: Not on file  . Intimate partner violence:    Fear of current or ex partner: Not on file    Emotionally abused: Not on file    Physically abused: Not on file    Forced sexual activity: Not on file  Other Topics Concern  . Not on file  Social History Narrative  . Not on file    Current Outpatient Medications on File Prior to Visit  Medication Sig Dispense Refill  . benzonatate (TESSALON) 100 MG capsule Take 100 mg by mouth 3 (three) times daily.    Marland Kitchen eplerenone (INSPRA) 50 MG tablet Take 50 mg by mouth daily.    . febuxostat (ULORIC) 40 MG tablet Take 40 mg by mouth daily.    Marland Kitchen HYDROcodone-acetaminophen (NORCO/VICODIN) 5-325 MG tablet Take 1 tablet by mouth  every 6 (six) hours as needed for moderate pain.    Marland Kitchen HYDROMET 5-1.5 MG/5ML syrup take 1 teaspoonful by mouth every 6 hours if needed cough 200 mL 0  . nebivolol (BYSTOLIC) 10 MG tablet Take 10 mg by mouth daily.    . pantoprazole (PROTONIX) 40 MG tablet Take 40 mg by mouth daily.       No current facility-administered medications on file prior to visit.      Review of Systems  Constitutional: Negative for malaise/fatigue and weight loss.  Respiratory: Negative for cough, hemoptysis and shortness of breath.   Cardiovascular: Positive for leg swelling (chronic). Negative for chest pain, palpitations and claudication.  Gastrointestinal: Negative for abdominal pain, blood in stool, constipation, heartburn and vomiting.  Genitourinary: Negative for dysuria.  Musculoskeletal: Positive for back pain (chronic) and joint pain (occasional gout flares). Negative for myalgias.    Neurological: Negative for dizziness, focal weakness and headaches.  Endo/Heme/Allergies: Does not bruise/bleed easily.  Psychiatric/Behavioral: Negative for depression. The patient is not nervous/anxious.   All other systems reviewed and are negative.      Objective:  There were no vitals taken for this visit.  Physical Exam  Constitutional: He appears well-developed and well-nourished. No distress.  HENT:  Head: Atraumatic.  Eyes: Conjunctivae are normal.  Neck: Neck supple. No JVD present. No thyromegaly present.  Cardiovascular: Normal rate, regular rhythm and intact distal pulses. Exam reveals no gallop.  Murmur heard.  Medium-pitched early systolic murmur is present with a grade of 2/6 at the upper right sternal border radiating to the neck. Pulmonary/Chest: Effort normal and breath sounds normal.  Abdominal: Soft. Bowel sounds are normal.  obese  Musculoskeletal: Normal range of motion.        General: No edema.  Neurological: He is alert.  Skin: Skin is warm and dry.  Psychiatric: He has a normal mood and affect.    ***Echocardiogram ***  ***Stress test***  ***Coronary angiogram***  Assessment & Recommendations:   1. Essential hypertension ***  2. CKD (chronic kidney disease) stage 3, GFR 30-59 ml/min (HCC) ***  3. Bilateral leg edema ***     Yates Decamp, MD, [facility] 06/27/2018, 7:42 AM [facility] Pager: [phone] Office: [phone] If no answer Cell [phone]

## 2018-07-17 DIAGNOSIS — N200 Calculus of kidney: Secondary | ICD-10-CM | POA: Diagnosis not present

## 2018-07-17 DIAGNOSIS — N5201 Erectile dysfunction due to arterial insufficiency: Secondary | ICD-10-CM | POA: Diagnosis not present

## 2018-07-23 ENCOUNTER — Ambulatory Visit (INDEPENDENT_AMBULATORY_CARE_PROVIDER_SITE_OTHER): Payer: Medicare Other | Admitting: Cardiology

## 2018-07-23 ENCOUNTER — Encounter: Payer: Self-pay | Admitting: Cardiology

## 2018-07-23 VITALS — BP 140/87 | HR 64 | Ht 68.0 in | Wt 223.0 lb

## 2018-07-23 DIAGNOSIS — I5033 Acute on chronic diastolic (congestive) heart failure: Secondary | ICD-10-CM | POA: Diagnosis not present

## 2018-07-23 DIAGNOSIS — N183 Chronic kidney disease, stage 3 unspecified: Secondary | ICD-10-CM | POA: Insufficient documentation

## 2018-07-23 DIAGNOSIS — E7800 Pure hypercholesterolemia, unspecified: Secondary | ICD-10-CM

## 2018-07-23 DIAGNOSIS — I10 Essential (primary) hypertension: Secondary | ICD-10-CM | POA: Insufficient documentation

## 2018-07-23 DIAGNOSIS — R600 Localized edema: Secondary | ICD-10-CM

## 2018-07-23 DIAGNOSIS — N1832 Chronic kidney disease, stage 3b: Secondary | ICD-10-CM | POA: Insufficient documentation

## 2018-07-23 DIAGNOSIS — I129 Hypertensive chronic kidney disease with stage 1 through stage 4 chronic kidney disease, or unspecified chronic kidney disease: Secondary | ICD-10-CM

## 2018-07-23 DIAGNOSIS — E669 Obesity, unspecified: Secondary | ICD-10-CM | POA: Diagnosis not present

## 2018-07-23 DIAGNOSIS — R9439 Abnormal result of other cardiovascular function study: Secondary | ICD-10-CM | POA: Diagnosis not present

## 2018-07-23 DIAGNOSIS — I1A0 Resistant hypertension: Secondary | ICD-10-CM | POA: Insufficient documentation

## 2018-07-23 DIAGNOSIS — R0609 Other forms of dyspnea: Secondary | ICD-10-CM | POA: Diagnosis not present

## 2018-07-23 DIAGNOSIS — E668 Other obesity: Secondary | ICD-10-CM | POA: Diagnosis not present

## 2018-07-23 DIAGNOSIS — R0600 Dyspnea, unspecified: Secondary | ICD-10-CM

## 2018-07-23 HISTORY — DX: Resistant hypertension: I1A.0

## 2018-07-23 HISTORY — DX: Chronic kidney disease, stage 3 unspecified: N18.30

## 2018-07-23 HISTORY — DX: Dyspnea, unspecified: R06.00

## 2018-07-23 HISTORY — DX: Essential (primary) hypertension: I10

## 2018-07-23 HISTORY — DX: Other obesity: E66.8

## 2018-07-23 HISTORY — DX: Pure hypercholesterolemia, unspecified: E78.00

## 2018-07-23 HISTORY — DX: Other forms of dyspnea: R06.09

## 2018-07-23 MED ORDER — ISOSORBIDE MONONITRATE ER 60 MG PO TB24: 60.0000 mg | ORAL_TABLET | Freq: Every day | ORAL | 2 refills | Status: DC

## 2018-07-23 NOTE — Progress Notes (Signed)
Subjective:  Primary Physician:  Pearson Grippe, MD  Patient ID: Keith [name], male    DOB: [birth year], 78 y.o.   MRN: [id number]  Chief Complaint  Patient presents with  . Hypertension    6 month F/U    HPI: Keith [name]  is a 78 y.o. male  with history of hypertension, hyperlipidemia. He has had difficulty in BP control. Since being on Clinidine, he has noticed dry mouth, also wanted to change bystolic to generics due to cost. Comes in for 1 year f/u. Has noticed BP to be uncontrolled Occasionally, but also has noticed worsening dyspnea on exertion and also mild leg edema. No CP or headache.He also has history of gout.  Past Medical History:  Diagnosis Date  . Abnormal nuclear stress test 02/24/2010   Nuclear stress 02/24/10 Infero lateral ischemia   . Allergic rhinitis, cause unspecified    skin test pos 02-23-2008  . Angina pectoris (HCC)   . CAD (coronary artery disease)   . CKD (chronic kidney disease) stage 3, GFR 30-59 ml/min (HCC) 07/23/2018  . Cough   . Dyspnea on exertion 07/23/2018  . GERD (gastroesophageal reflux disease)   . Hypercholesteremia 07/23/2018  . Ischemia   . Kidney stone   . Migraine   . Moderate obesity 07/23/2018  . Nephrolithiasis   . Resistant hypertension 07/23/2018  . TIA (transient ischemic attack)   . Unspecified essential hypertension     Past Surgical History:  Procedure Laterality Date  . bilateral tkr    . HERNIA REPAIR    . reset nasal fracture    . TONSILLECTOMY      Social History   Socioeconomic History  . Marital status: Married    Spouse name: Not on file  . Number of children: 1  . Years of education: Not on file  . Highest education level: Not on file  Occupational History  . Occupation: retired  . Occupation: works part Presenter, broadcasting Needs  . Financial resource strain: Not on file  . Food insecurity:    Worry: Not on file    Inability: Not on file  . Transportation needs:    Medical: Not on file     Non-medical: Not on file  Tobacco Use  . Smoking status: Never Smoker  . Smokeless tobacco: Never Used  Substance and Sexual Activity  . Alcohol use: No    Alcohol/week: 0.0 standard drinks    Comment: Pt used to have alcohol problems.  . Drug use: No  . Sexual activity: Not on file  Lifestyle  . Physical activity:    Days per week: Not on file    Minutes per session: Not on file  . Stress: Not on file  Relationships  . Social connections:    Talks on phone: Not on file    Gets together: Not on file    Attends religious service: Not on file    Active member of club or organization: Not on file    Attends meetings of clubs or organizations: Not on file    Relationship status: Not on file  . Intimate partner violence:    Fear of current or ex partner: Not on file    Emotionally abused: Not on file    Physically abused: Not on file    Forced sexual activity: Not on file  Other Topics Concern  . Not on file  Social History Narrative  . Not on file    Current Outpatient  Medications on File Prior to Visit  Medication Sig Dispense Refill  . cloNIDine (CATAPRES) 0.2 MG tablet Take 0.2 mg by mouth daily.    Marland Kitchen HYDROcodone-acetaminophen (NORCO/VICODIN) 5-325 MG tablet Take 1 tablet by mouth every 6 (six) hours as needed for moderate pain.    . nebivolol (BYSTOLIC) 10 MG tablet Take 10 mg by mouth daily.    Marland Kitchen olmesartan (BENICAR) 20 MG tablet Take 1 tablet by mouth daily.    . pantoprazole (PROTONIX) 40 MG tablet Take 40 mg by mouth daily.       No current facility-administered medications on file prior to visit.      Review of Systems  Constitutional: Negative for malaise/fatigue and weight loss.  Respiratory: Positive for shortness of breath (chronic). Negative for cough and hemoptysis.   Cardiovascular: Positive for leg swelling (very mild\). Negative for chest pain, palpitations and claudication.  Gastrointestinal: Negative for abdominal pain, blood in stool, constipation,  heartburn and vomiting.  Genitourinary: Negative for dysuria and hematuria.       Renal stones  Musculoskeletal: Positive for back pain (chronic) and joint pain (with gout). Negative for myalgias.  Neurological: Negative for dizziness, focal weakness and headaches.  Endo/Heme/Allergies: Does not bruise/bleed easily.  Psychiatric/Behavioral: Negative for depression. The patient is not nervous/anxious.   All other systems reviewed and are negative.      Objective:  Blood pressure 140/87, pulse 64, height 5\' 8"  (1.727 m), weight 223 lb (101.2 kg), SpO2 96 %. Body mass index is 33.91 kg/m.  Physical Exam  Constitutional: He appears well-developed. No distress.  Morbidly obese  HENT:  Head: Atraumatic.  Eyes: Conjunctivae are normal.  Neck: Neck supple. No JVD present. No thyromegaly present.  Cardiovascular: Normal rate, regular rhythm, S1 normal and S2 normal. Exam reveals no gallop.  Murmur heard.  Harsh early systolic murmur is present with a grade of 2/6 at the upper right sternal border radiating to the neck. Pulses:      Carotid pulses are 2+ on the right side and 2+ on the left side.      Dorsalis pedis pulses are 2+ on the right side and 2+ on the left side.       Posterior tibial pulses are 2+ on the right side and 2+ on the left side.  Femoral and popliteal pulse difficult to feel due to patient's bodily habitus.  Pulmonary/Chest: Effort normal and breath sounds normal.  Abdominal: Soft. Bowel sounds are normal.  Obese. Pannus present  Musculoskeletal: Normal range of motion.        General: Edema (2 plus right leg worse) present.  Neurological: He is alert.  Skin: Skin is warm and dry.  Psychiatric: He has a normal mood and affect.   CARDIAC STUDIES:   Nuclear stress 02/24/10: Infero lateral ischemia.  Positive EKG for ischemia; Stress EKG: Frequent PVC and couplets and bigeminy with  stress with 1 mm inferior-lateral ST deppression on stress  Echocardiogram:  03/02/10: Normal LV Systolic function mild diastolic dysfunction, Mild LVH Mod aortic sclerosis, No Aortic Stenosis.   Renal Duplex: No RAS. Left renal cyst vs pevic hydronephrosis 4.5cm.    Assessment & Recommendations:   1. Resistant hypertension related to his diet EKG 07/23/2018: Sinus bradycardia at rate of 60 bpm with first-degree AV block.  Normal axis.  Poor R-wave progression, cannot exclude anteroseptal infarct old.  Nonspecific T abnormality.  2. Dyspnea on exertion due to acute on chronic diastolic CHF.  3. Obesity (BMI 30.0-34.9)  4.  Moderate obesity  5. CKD (chronic kidney disease) stage 3, GFR 30-59 ml/min (HCC)  6. Abnormal nuclear stress test.  7. Bilateral leg edema.  8. H/O Gout   Recommendation:   Patient's symptoms of worsening dyspnea on exertion is related to acute on chronic diastolic heart failure, most probably related to his dietary habits.  However underlying progression of coronary disease cannot be excluded, previously has had abnormal stress test.  He has significant leg edema, unfortunately prescribing him diuretics will exacerbate his gout.  Hence we have had a lengthy discussion regarding making lifestyle changes and also avoidance of salt.  Schedule for a Exercise Nuclear stress test to evaluate for myocardial ischemia. Will schedule for an echocardiogram. Office visit following the work-up/investigations.   Patient with moderate obesity, resistant hypertension, underlying sinus bradycardia and unable to tolerate any of the beta blockers,restarted Bystolic. I have also started him on isosorbide mononitrate for preload reduction.  Advised him to use support stockings for now on a regular basis.  Symptoms are ongoing for the past several weeks, this can be managed in the outpatient setting.   Yates Decamp, MD, [facility] 07/23/2018, 11:30 AM [facility] [place] Pager: [phone] Office: [phone] If no answer Cell [phone]

## 2018-07-29 ENCOUNTER — Telehealth: Payer: Self-pay

## 2018-07-29 NOTE — Telephone Encounter (Signed)
Pt called stating he does not think he will be able to do the treadmill stress test on Monday 08/04/2018. Says his legs are very weak.  Wants to know if he is to have a nuclear stress test and treadmill, or if he can just do the nuclear.  Please Advise.  KC

## 2018-07-30 NOTE — Telephone Encounter (Signed)
Change to Lexi. He can mention to the techs that he cannot walk

## 2018-08-04 ENCOUNTER — Other Ambulatory Visit: Payer: Medicare Other

## 2018-08-13 ENCOUNTER — Other Ambulatory Visit: Payer: Medicare Other

## 2018-08-21 ENCOUNTER — Other Ambulatory Visit: Payer: Medicare Other

## 2018-08-25 ENCOUNTER — Ambulatory Visit (INDEPENDENT_AMBULATORY_CARE_PROVIDER_SITE_OTHER): Payer: Medicare Other

## 2018-08-25 ENCOUNTER — Other Ambulatory Visit: Payer: Self-pay

## 2018-08-25 DIAGNOSIS — I5033 Acute on chronic diastolic (congestive) heart failure: Secondary | ICD-10-CM | POA: Diagnosis not present

## 2018-08-25 DIAGNOSIS — R0609 Other forms of dyspnea: Secondary | ICD-10-CM

## 2018-09-01 ENCOUNTER — Ambulatory Visit: Payer: Medicare Other | Admitting: Cardiology

## 2018-09-02 ENCOUNTER — Ambulatory Visit (INDEPENDENT_AMBULATORY_CARE_PROVIDER_SITE_OTHER): Payer: Medicare Other

## 2018-09-02 DIAGNOSIS — I5033 Acute on chronic diastolic (congestive) heart failure: Secondary | ICD-10-CM | POA: Diagnosis not present

## 2018-09-02 DIAGNOSIS — R0609 Other forms of dyspnea: Secondary | ICD-10-CM | POA: Diagnosis not present

## 2018-09-04 NOTE — Progress Notes (Signed)
Spoke to pt, advised him of echo results and to keep upcoming appt.

## 2018-09-19 ENCOUNTER — Ambulatory Visit: Payer: Medicare Other | Admitting: Cardiology

## 2018-09-22 ENCOUNTER — Other Ambulatory Visit: Payer: Self-pay

## 2018-09-22 DIAGNOSIS — I10 Essential (primary) hypertension: Secondary | ICD-10-CM

## 2018-09-22 MED ORDER — CLONIDINE HCL 0.2 MG PO TABS: 0.2000 mg | ORAL_TABLET | Freq: Two times a day (BID) | ORAL | 3 refills | Status: DC

## 2018-10-07 DIAGNOSIS — H353132 Nonexudative age-related macular degeneration, bilateral, intermediate dry stage: Secondary | ICD-10-CM | POA: Diagnosis not present

## 2018-10-16 ENCOUNTER — Other Ambulatory Visit: Payer: Self-pay | Admitting: Cardiology

## 2018-10-16 DIAGNOSIS — I10 Essential (primary) hypertension: Secondary | ICD-10-CM

## 2018-11-27 DIAGNOSIS — J029 Acute pharyngitis, unspecified: Secondary | ICD-10-CM | POA: Diagnosis not present

## 2018-12-01 DIAGNOSIS — I10 Essential (primary) hypertension: Secondary | ICD-10-CM | POA: Diagnosis not present

## 2018-12-01 DIAGNOSIS — E039 Hypothyroidism, unspecified: Secondary | ICD-10-CM | POA: Diagnosis not present

## 2018-12-01 DIAGNOSIS — R319 Hematuria, unspecified: Secondary | ICD-10-CM | POA: Diagnosis not present

## 2018-12-01 DIAGNOSIS — N390 Urinary tract infection, site not specified: Secondary | ICD-10-CM | POA: Diagnosis not present

## 2018-12-01 DIAGNOSIS — Z125 Encounter for screening for malignant neoplasm of prostate: Secondary | ICD-10-CM | POA: Diagnosis not present

## 2018-12-01 DIAGNOSIS — E7800 Pure hypercholesterolemia, unspecified: Secondary | ICD-10-CM | POA: Diagnosis not present

## 2018-12-01 DIAGNOSIS — R739 Hyperglycemia, unspecified: Secondary | ICD-10-CM | POA: Diagnosis not present

## 2018-12-04 DIAGNOSIS — N200 Calculus of kidney: Secondary | ICD-10-CM | POA: Diagnosis not present

## 2018-12-05 ENCOUNTER — Other Ambulatory Visit: Payer: Self-pay | Admitting: Cardiology

## 2018-12-08 DIAGNOSIS — E7800 Pure hypercholesterolemia, unspecified: Secondary | ICD-10-CM | POA: Diagnosis not present

## 2018-12-08 DIAGNOSIS — M549 Dorsalgia, unspecified: Secondary | ICD-10-CM | POA: Diagnosis not present

## 2018-12-08 DIAGNOSIS — E039 Hypothyroidism, unspecified: Secondary | ICD-10-CM | POA: Diagnosis not present

## 2018-12-08 DIAGNOSIS — J301 Allergic rhinitis due to pollen: Secondary | ICD-10-CM | POA: Diagnosis not present

## 2018-12-08 DIAGNOSIS — R609 Edema, unspecified: Secondary | ICD-10-CM | POA: Diagnosis not present

## 2018-12-08 DIAGNOSIS — I10 Essential (primary) hypertension: Secondary | ICD-10-CM | POA: Diagnosis not present

## 2018-12-08 DIAGNOSIS — B001 Herpesviral vesicular dermatitis: Secondary | ICD-10-CM | POA: Diagnosis not present

## 2018-12-08 DIAGNOSIS — R739 Hyperglycemia, unspecified: Secondary | ICD-10-CM | POA: Diagnosis not present

## 2018-12-08 DIAGNOSIS — L299 Pruritus, unspecified: Secondary | ICD-10-CM | POA: Diagnosis not present

## 2018-12-08 DIAGNOSIS — R351 Nocturia: Secondary | ICD-10-CM | POA: Diagnosis not present

## 2018-12-08 DIAGNOSIS — I2510 Atherosclerotic heart disease of native coronary artery without angina pectoris: Secondary | ICD-10-CM | POA: Diagnosis not present

## 2018-12-08 DIAGNOSIS — J3489 Other specified disorders of nose and nasal sinuses: Secondary | ICD-10-CM | POA: Diagnosis not present

## 2018-12-12 DIAGNOSIS — M722 Plantar fascial fibromatosis: Secondary | ICD-10-CM | POA: Diagnosis not present

## 2018-12-12 DIAGNOSIS — M7732 Calcaneal spur, left foot: Secondary | ICD-10-CM | POA: Diagnosis not present

## 2018-12-26 ENCOUNTER — Telehealth: Payer: Self-pay

## 2018-12-26 DIAGNOSIS — N200 Calculus of kidney: Secondary | ICD-10-CM | POA: Diagnosis not present

## 2018-12-26 NOTE — Telephone Encounter (Signed)
Stress test shows that he probably may have blockage in the bottom part of the heart however appears less likely and it may be stomach shadowing, overall does not appear to be significantly different from prior stress test.With regard to hypertension, discontinue isosorbide mononitrate and switch him to isosorbide dinitrate 40 mg p.o. 3 times daily and add hydralazine 50 mg p.o. 3 times daily.  Otherwise I have no other options.

## 2018-12-26 NOTE — Telephone Encounter (Signed)
Pt called c/o BP being high 182/101 and he says that its been like that for a while; No other symptoms associated with BP elevated; He wants to know if we can maybe look at his medications to see if any changes can be made; He also wants a more detailed report from his stress test; I told pt he needs to make an appt he doesn't want too

## 2018-12-29 NOTE — Telephone Encounter (Signed)
Awaiting a call back

## 2019-01-01 DIAGNOSIS — M65871 Other synovitis and tenosynovitis, right ankle and foot: Secondary | ICD-10-CM | POA: Diagnosis not present

## 2019-01-01 DIAGNOSIS — M722 Plantar fascial fibromatosis: Secondary | ICD-10-CM | POA: Diagnosis not present

## 2019-01-01 DIAGNOSIS — M7732 Calcaneal spur, left foot: Secondary | ICD-10-CM | POA: Diagnosis not present

## 2019-02-12 DIAGNOSIS — N200 Calculus of kidney: Secondary | ICD-10-CM | POA: Diagnosis not present

## 2019-02-26 DIAGNOSIS — N200 Calculus of kidney: Secondary | ICD-10-CM | POA: Diagnosis not present

## 2019-02-27 ENCOUNTER — Other Ambulatory Visit: Payer: Self-pay | Admitting: Urology

## 2019-03-02 ENCOUNTER — Other Ambulatory Visit: Payer: Self-pay

## 2019-03-02 ENCOUNTER — Encounter (HOSPITAL_COMMUNITY): Payer: Self-pay | Admitting: *Deleted

## 2019-03-02 ENCOUNTER — Ambulatory Visit (HOSPITAL_COMMUNITY): Payer: Medicare Other

## 2019-03-02 ENCOUNTER — Ambulatory Visit (HOSPITAL_COMMUNITY)
Admission: RE | Admit: 2019-03-02 | Discharge: 2019-03-02 | Disposition: A | Discharge status: Home / Self Care | Payer: Medicare Other | Attending: Urology | Admitting: Urology

## 2019-03-02 ENCOUNTER — Encounter (HOSPITAL_COMMUNITY)
Admission: RE | Disposition: A | Discharge status: Home / Self Care | Payer: Self-pay | Source: Home / Self Care | Attending: Urology

## 2019-03-02 DIAGNOSIS — Z9842 Cataract extraction status, left eye: Secondary | ICD-10-CM | POA: Diagnosis not present

## 2019-03-02 DIAGNOSIS — J45909 Unspecified asthma, uncomplicated: Secondary | ICD-10-CM | POA: Insufficient documentation

## 2019-03-02 DIAGNOSIS — M1990 Unspecified osteoarthritis, unspecified site: Secondary | ICD-10-CM | POA: Insufficient documentation

## 2019-03-02 DIAGNOSIS — Z79899 Other long term (current) drug therapy: Secondary | ICD-10-CM | POA: Diagnosis not present

## 2019-03-02 DIAGNOSIS — N201 Calculus of ureter: Secondary | ICD-10-CM | POA: Diagnosis not present

## 2019-03-02 DIAGNOSIS — K219 Gastro-esophageal reflux disease without esophagitis: Secondary | ICD-10-CM | POA: Insufficient documentation

## 2019-03-02 DIAGNOSIS — K449 Diaphragmatic hernia without obstruction or gangrene: Secondary | ICD-10-CM | POA: Insufficient documentation

## 2019-03-02 DIAGNOSIS — M109 Gout, unspecified: Secondary | ICD-10-CM | POA: Diagnosis not present

## 2019-03-02 DIAGNOSIS — Z9841 Cataract extraction status, right eye: Secondary | ICD-10-CM | POA: Insufficient documentation

## 2019-03-02 DIAGNOSIS — Z96653 Presence of artificial knee joint, bilateral: Secondary | ICD-10-CM | POA: Insufficient documentation

## 2019-03-02 DIAGNOSIS — Z888 Allergy status to other drugs, medicaments and biological substances status: Secondary | ICD-10-CM | POA: Insufficient documentation

## 2019-03-02 DIAGNOSIS — I10 Essential (primary) hypertension: Secondary | ICD-10-CM | POA: Diagnosis not present

## 2019-03-02 HISTORY — PX: EXTRACORPOREAL SHOCK WAVE LITHOTRIPSY: SHX1557

## 2019-03-02 SURGERY — LITHOTRIPSY, ESWL
Anesthesia: LOCAL | Laterality: Right

## 2019-03-02 MED ORDER — DIPHENHYDRAMINE HCL 25 MG PO CAPS
25.0000 mg | ORAL_CAPSULE | ORAL | Status: AC
  Administered 2019-03-02: 25 mg via ORAL
  Filled 2019-03-02: qty 1

## 2019-03-02 MED ORDER — HYDROCODONE-ACETAMINOPHEN 5-325 MG PO TABS: 1.0000 | ORAL_TABLET | Freq: Four times a day (QID) | ORAL | 0 refills | Status: DC | PRN

## 2019-03-02 MED ORDER — SODIUM CHLORIDE 0.9 % IV SOLN
INTRAVENOUS | Status: DC
  Administered 2019-03-02: 15:00:00 via INTRAVENOUS

## 2019-03-02 MED ORDER — DIAZEPAM 5 MG PO TABS
10.0000 mg | ORAL_TABLET | ORAL | Status: AC
  Administered 2019-03-02: 10 mg via ORAL
  Filled 2019-03-02: qty 2

## 2019-03-02 MED ORDER — CIPROFLOXACIN HCL 500 MG PO TABS
500.0000 mg | ORAL_TABLET | ORAL | Status: AC
  Administered 2019-03-02: 500 mg via ORAL
  Filled 2019-03-02: qty 1

## 2019-03-02 NOTE — Op Note (Signed)
See [facility] OP note scanned into chart. Also because of the size, density, location and other factors that cannot be anticipated I feel this will likely be a staged procedure. This fact supersedes any indication in the scanned [facility] operative note to the contrary.  

## 2019-03-02 NOTE — H&P (Signed)
See scanned H&P

## 2019-03-03 ENCOUNTER — Encounter (HOSPITAL_COMMUNITY): Payer: Self-pay | Admitting: Urology

## 2019-03-10 DIAGNOSIS — Z23 Encounter for immunization: Secondary | ICD-10-CM | POA: Diagnosis not present

## 2019-03-27 DIAGNOSIS — N200 Calculus of kidney: Secondary | ICD-10-CM | POA: Diagnosis not present

## 2019-06-03 DIAGNOSIS — E7800 Pure hypercholesterolemia, unspecified: Secondary | ICD-10-CM | POA: Diagnosis not present

## 2019-06-03 DIAGNOSIS — I10 Essential (primary) hypertension: Secondary | ICD-10-CM | POA: Diagnosis not present

## 2019-06-03 DIAGNOSIS — E039 Hypothyroidism, unspecified: Secondary | ICD-10-CM | POA: Diagnosis not present

## 2019-06-03 DIAGNOSIS — R739 Hyperglycemia, unspecified: Secondary | ICD-10-CM | POA: Diagnosis not present

## 2019-06-10 DIAGNOSIS — R0989 Other specified symptoms and signs involving the circulatory and respiratory systems: Secondary | ICD-10-CM | POA: Diagnosis not present

## 2019-06-10 DIAGNOSIS — I10 Essential (primary) hypertension: Secondary | ICD-10-CM | POA: Diagnosis not present

## 2019-06-10 DIAGNOSIS — E039 Hypothyroidism, unspecified: Secondary | ICD-10-CM | POA: Diagnosis not present

## 2019-06-10 DIAGNOSIS — M549 Dorsalgia, unspecified: Secondary | ICD-10-CM | POA: Diagnosis not present

## 2019-06-10 DIAGNOSIS — R739 Hyperglycemia, unspecified: Secondary | ICD-10-CM | POA: Diagnosis not present

## 2019-06-29 ENCOUNTER — Ambulatory Visit: Payer: Medicare Other | Attending: Internal Medicine

## 2019-06-29 DIAGNOSIS — Z23 Encounter for immunization: Secondary | ICD-10-CM | POA: Insufficient documentation

## 2019-06-29 NOTE — Progress Notes (Signed)
    Covid-19 Vaccination Clinic  Name:  Keith [name]    MRN: [id number] DOB: [birth year]  06/29/2019  [name] was observed post Covid-19 immunization for 15 minutes without incidence. He was provided with Vaccine Information Sheet and instruction to access the V-Safe system.   [name] was instructed to call 911 with any severe reactions post vaccine: Marland Kitchen Difficulty breathing  . Swelling of your face and throat  . A fast heartbeat  . A bad rash all over your body  . Dizziness and weakness    Immunizations Administered    Name Date Dose VIS Date Route   Pfizer COVID-19 Vaccine 06/29/2019  5:22 PM 0.3 mL 05/01/2019 Intramuscular   Manufacturer: [employer], [employer]   Lot: [id number]   NDC: [address]-3

## 2019-07-24 ENCOUNTER — Ambulatory Visit: Payer: Medicare Other | Attending: Internal Medicine

## 2019-07-24 DIAGNOSIS — Z23 Encounter for immunization: Secondary | ICD-10-CM | POA: Insufficient documentation

## 2019-07-24 NOTE — Progress Notes (Signed)
   Covid-19 Vaccination Clinic  Name:  [name]    MRN: [id number] DOB: 24-Dec-1940  07/24/2019  [name] was observed post Covid-19 immunization for 15 minutes without incident. He was provided with Vaccine Information Sheet and instruction to access the V-Safe system.   [name] was instructed to call 911 with any severe reactions post vaccine: Marland Kitchen Difficulty breathing  . Swelling of face and throat  . A fast heartbeat  . A bad rash all over body  . Dizziness and weakness   Immunizations Administered    Name Date Dose VIS Date Route   Pfizer COVID-19 Vaccine 07/24/2019  3:38 PM 0.3 mL 05/01/2019 Intramuscular   Manufacturer: [employer], [employer]   Lot: [id number]   NDC: [address]-4

## 2019-09-01 DIAGNOSIS — E039 Hypothyroidism, unspecified: Secondary | ICD-10-CM | POA: Diagnosis not present

## 2019-09-01 DIAGNOSIS — I10 Essential (primary) hypertension: Secondary | ICD-10-CM | POA: Diagnosis not present

## 2019-09-01 DIAGNOSIS — R739 Hyperglycemia, unspecified: Secondary | ICD-10-CM | POA: Diagnosis not present

## 2019-09-08 DIAGNOSIS — E039 Hypothyroidism, unspecified: Secondary | ICD-10-CM | POA: Diagnosis not present

## 2019-09-08 DIAGNOSIS — R739 Hyperglycemia, unspecified: Secondary | ICD-10-CM | POA: Diagnosis not present

## 2019-09-08 DIAGNOSIS — I10 Essential (primary) hypertension: Secondary | ICD-10-CM | POA: Diagnosis not present

## 2019-09-08 DIAGNOSIS — I2510 Atherosclerotic heart disease of native coronary artery without angina pectoris: Secondary | ICD-10-CM | POA: Diagnosis not present

## 2019-09-08 DIAGNOSIS — M549 Dorsalgia, unspecified: Secondary | ICD-10-CM | POA: Diagnosis not present

## 2019-09-08 DIAGNOSIS — E7800 Pure hypercholesterolemia, unspecified: Secondary | ICD-10-CM | POA: Diagnosis not present

## 2019-09-10 DIAGNOSIS — N200 Calculus of kidney: Secondary | ICD-10-CM | POA: Diagnosis not present

## 2019-09-10 DIAGNOSIS — N281 Cyst of kidney, acquired: Secondary | ICD-10-CM | POA: Diagnosis not present

## 2019-09-10 DIAGNOSIS — M549 Dorsalgia, unspecified: Secondary | ICD-10-CM | POA: Diagnosis not present

## 2019-10-08 DIAGNOSIS — H9202 Otalgia, left ear: Secondary | ICD-10-CM | POA: Diagnosis not present

## 2019-10-08 DIAGNOSIS — H838X3 Other specified diseases of inner ear, bilateral: Secondary | ICD-10-CM | POA: Diagnosis not present

## 2019-10-08 DIAGNOSIS — R42 Dizziness and giddiness: Secondary | ICD-10-CM | POA: Diagnosis not present

## 2019-10-08 DIAGNOSIS — H903 Sensorineural hearing loss, bilateral: Secondary | ICD-10-CM | POA: Diagnosis not present

## 2019-10-08 DIAGNOSIS — H6122 Impacted cerumen, left ear: Secondary | ICD-10-CM | POA: Diagnosis not present

## 2019-10-08 DIAGNOSIS — H9313 Tinnitus, bilateral: Secondary | ICD-10-CM | POA: Diagnosis not present

## 2019-10-13 DIAGNOSIS — S20211A Contusion of right front wall of thorax, initial encounter: Secondary | ICD-10-CM | POA: Diagnosis not present

## 2019-11-30 ENCOUNTER — Other Ambulatory Visit: Payer: Self-pay | Admitting: Cardiology

## 2019-12-03 DIAGNOSIS — Z79891 Long term (current) use of opiate analgesic: Secondary | ICD-10-CM | POA: Diagnosis not present

## 2019-12-09 DIAGNOSIS — R42 Dizziness and giddiness: Secondary | ICD-10-CM | POA: Diagnosis not present

## 2019-12-09 DIAGNOSIS — R609 Edema, unspecified: Secondary | ICD-10-CM | POA: Diagnosis not present

## 2019-12-09 DIAGNOSIS — Z0000 Encounter for general adult medical examination without abnormal findings: Secondary | ICD-10-CM | POA: Diagnosis not present

## 2019-12-09 DIAGNOSIS — M549 Dorsalgia, unspecified: Secondary | ICD-10-CM | POA: Diagnosis not present

## 2019-12-09 DIAGNOSIS — M546 Pain in thoracic spine: Secondary | ICD-10-CM | POA: Diagnosis not present

## 2019-12-09 DIAGNOSIS — I2510 Atherosclerotic heart disease of native coronary artery without angina pectoris: Secondary | ICD-10-CM | POA: Diagnosis not present

## 2019-12-09 DIAGNOSIS — I10 Essential (primary) hypertension: Secondary | ICD-10-CM | POA: Diagnosis not present

## 2019-12-09 DIAGNOSIS — E7800 Pure hypercholesterolemia, unspecified: Secondary | ICD-10-CM | POA: Diagnosis not present

## 2019-12-09 DIAGNOSIS — M545 Low back pain: Secondary | ICD-10-CM | POA: Diagnosis not present

## 2019-12-09 DIAGNOSIS — R739 Hyperglycemia, unspecified: Secondary | ICD-10-CM | POA: Diagnosis not present

## 2019-12-09 DIAGNOSIS — R0600 Dyspnea, unspecified: Secondary | ICD-10-CM | POA: Diagnosis not present

## 2019-12-09 DIAGNOSIS — E039 Hypothyroidism, unspecified: Secondary | ICD-10-CM | POA: Diagnosis not present

## 2019-12-30 ENCOUNTER — Other Ambulatory Visit: Payer: Self-pay | Admitting: Cardiology

## 2020-01-12 ENCOUNTER — Ambulatory Visit: Payer: Medicare Other | Admitting: Cardiology

## 2020-01-12 ENCOUNTER — Telehealth: Payer: Self-pay | Admitting: Student

## 2020-01-12 ENCOUNTER — Encounter: Payer: Self-pay | Admitting: Cardiology

## 2020-01-12 ENCOUNTER — Other Ambulatory Visit: Payer: Self-pay

## 2020-01-12 VITALS — BP 168/89 | HR 58 | Resp 16 | Ht 68.0 in | Wt 224.0 lb

## 2020-01-12 DIAGNOSIS — I25118 Atherosclerotic heart disease of native coronary artery with other forms of angina pectoris: Secondary | ICD-10-CM

## 2020-01-12 DIAGNOSIS — R9439 Abnormal result of other cardiovascular function study: Secondary | ICD-10-CM

## 2020-01-12 DIAGNOSIS — I10 Essential (primary) hypertension: Secondary | ICD-10-CM | POA: Diagnosis not present

## 2020-01-12 DIAGNOSIS — I5042 Chronic combined systolic (congestive) and diastolic (congestive) heart failure: Secondary | ICD-10-CM | POA: Diagnosis not present

## 2020-01-12 DIAGNOSIS — E7800 Pure hypercholesterolemia, unspecified: Secondary | ICD-10-CM | POA: Diagnosis not present

## 2020-01-12 MED ORDER — ISOSORBIDE DINITRATE 30 MG PO TABS: 30.0000 mg | ORAL_TABLET | Freq: Three times a day (TID) | ORAL | 2 refills | Status: DC

## 2020-01-12 MED ORDER — HYDRALAZINE HCL 25 MG PO TABS: 25.0000 mg | ORAL_TABLET | Freq: Three times a day (TID) | ORAL | 2 refills | Status: DC

## 2020-01-12 MED ORDER — OLMESARTAN MEDOXOMIL 20 MG PO TABS: 20.0000 mg | ORAL_TABLET | Freq: Every day | ORAL | 3 refills | Status: DC

## 2020-01-12 MED ORDER — NEBIVOLOL HCL 20 MG PO TABS: 20.0000 mg | ORAL_TABLET | Freq: Every day | ORAL | 3 refills | Status: DC

## 2020-01-12 MED ORDER — NEBIVOLOL HCL 20 MG PO TABS: 10.0000 mg | ORAL_TABLET | Freq: Every day | ORAL | 3 refills | Status: DC

## 2020-01-12 NOTE — Telephone Encounter (Signed)
Spoke with pharmacy regarding Bystolic refill request. Pharmacy informed office that request was sent, but to incorrect physician due to an error. Therefore our office did not receive the request. Updated information with pharmacy. Verified that patient currently has refills and that future requests will come to [facility].

## 2020-01-12 NOTE — Progress Notes (Signed)
Primary Physician/Referring:  Pearson Grippe, MD  Patient ID: Keith [name], male    DOB: 1940-06-06, 79 y.o.   MRN: [id number]  Chief Complaint  Patient presents with  . Follow-up  . Resistant Hypertension  . Medication Reconciliation   HPI:    Keith [name]  is a 79 y.o. Caucasian male with history of hypertension, gout, renal stones, hyperlipidemia. He has had difficulty in BP control.  Comes in for 1 year f/u.  Since being on clonidine initially had dry mouth but symptoms have resolved and states that as his blood pressure was relatively well controlled he has been taking it once a day.  Few days ago he ran out of prescription for Imdur and Benicar.  He complains of chronic mild dyspnea and arthritis.  No chest pain, dizziness or syncope.  Past Medical History:  Diagnosis Date  . Abnormal nuclear stress test 02/24/2010   Nuclear stress 02/24/10 Infero lateral ischemia   . Allergic rhinitis, cause unspecified    skin test pos 02-23-2008  . Angina pectoris (HCC)   . CAD (coronary artery disease)   . CKD (chronic kidney disease) stage 3, GFR 30-59 ml/min 07/23/2018  . Cough   . Dyspnea on exertion 07/23/2018  . GERD (gastroesophageal reflux disease)   . Hypercholesteremia 07/23/2018  . Ischemia   . Kidney stone   . Migraine   . Moderate obesity 07/23/2018  . Nephrolithiasis   . Resistant hypertension 07/23/2018  . TIA (transient ischemic attack)   . Unspecified essential hypertension    Past Surgical History:  Procedure Laterality Date  . bilateral tkr    . EXTRACORPOREAL SHOCK WAVE LITHOTRIPSY Right 03/02/2019   Procedure: EXTRACORPOREAL SHOCK WAVE LITHOTRIPSY (ESWL);  Surgeon: Crista Elliot, MD;  Location: [facility];  Service: Urology;  Laterality: Right;  . HERNIA REPAIR    . reset nasal fracture    . TONSILLECTOMY     Family History  Problem Relation Age of Onset  . Heart attack Father   . Stroke Father   . Allergy (severe) Mother   . Tuberculosis Other         grandmother    Social History   Tobacco Use  . Smoking status: Never Smoker  . Smokeless tobacco: Never Used  Substance Use Topics  . Alcohol use: No    Alcohol/week: 0.0 standard drinks    Comment: Pt used to have alcohol problems.   Marital Status: Married  ROS  Review of Systems  Cardiovascular: Positive for dyspnea on exertion. Negative for chest pain and leg swelling.  Musculoskeletal: Positive for arthritis and back pain.  Gastrointestinal: Negative for melena.  Genitourinary: Positive for frequency.   Objective  Blood pressure (!) 168/89, pulse (!) 58, resp. rate 16, height 5\' 8"  (1.727 m), weight 224 lb (101.6 kg), SpO2 96 %.  Vitals with BMI 01/12/2020 03/02/2019 03/02/2019  Height 5\' 8"  - 5\' 8"   Weight 224 lbs - 215 lbs 6 oz  BMI 34.07 - 32.76  Systolic 168 165 825  Diastolic 89 94 101  Pulse 58 54 53     Physical Exam Constitutional:      Appearance: He is obese.  HENT:     Head: Atraumatic.  Cardiovascular:     Rate and Rhythm: Normal rate and regular rhythm.     Pulses: Normal pulses and intact distal pulses.     Heart sounds: S1 normal and S2 normal. Murmur heard.  Harsh midsystolic murmur is present with a  grade of 2/6 at the upper right sternal border.  No gallop.      Comments: Trace edema. No JVD.  Pulmonary:     Effort: Pulmonary effort is normal.     Breath sounds: Normal breath sounds.  Abdominal:     General: Bowel sounds are normal.     Palpations: Abdomen is soft.    Laboratory examination:   No results for input(s): NA, K, CL, CO2, GLUCOSE, BUN, CREATININE, CALCIUM, GFRNONAA, GFRAA in the last 8760 hours. CrCl cannot be calculated (No successful lab value found.).  No flowsheet data found. CBC Latest Ref Rng & Units 01/23/2008  WBC 4.5 - 10.5 10*3/microliter 14.8(H)  Hemoglobin 13.0 - 17.0 g/dL 79.0  Hematocrit 24.0 - 52.0 % 48.5  Platelets 150 - 400 K/uL 192    Lipid Panel No results for input(s): CHOL, TRIG, LDLCALC, VLDL, HDL,  CHOLHDL, LDLDIRECT in the last 8760 hours.  HEMOGLOBIN A1C No results found for: HGBA1C, MPG TSH No results for input(s): TSH in the last 8760 hours.  External labs:   TSH 3.130 09/01/2019   LDL-C 124.000 09/01/2019 Triglycerides 104.000 09/01/2019  Medications and allergies   Allergies  Allergen Reactions  . Amlodipine Swelling  . Labetalol Swelling    Swollen lips   . Lisinopril Cough  . Penicillins Rash  . Procaine Hcl     REACTION: increased bp     Outpatient Medications Prior to Visit  Medication Sig Dispense Refill  . HYDROcodone-acetaminophen (NORCO/VICODIN) 5-325 MG tablet Take 1 tablet by mouth every 6 (six) hours as needed for moderate pain. 10 tablet 0  . pantoprazole (PROTONIX) 40 MG tablet Take 40 mg by mouth daily.      Marland Kitchen BYSTOLIC 20 MG TABS TAKE 1 TABLET EVERY DAY 90 tablet 3  . cloNIDine (CATAPRES) 0.2 MG tablet Take 1 tablet (0.2 mg total) by mouth 2 (two) times daily. 180 tablet 3  . Nebivolol HCl 20 MG TABS Take 10 mg by mouth daily.     . isosorbide mononitrate (IMDUR) 60 MG 24 hr tablet TAKE 1 TABLET(60 MG) BY MOUTH DAILY (Patient not taking: Reported on 01/12/2020) 30 tablet 2  . olmesartan (BENICAR) 20 MG tablet TAKE 1 TABLET BY MOUTH EVERY DAY (Patient not taking: Reported on 01/12/2020) 30 tablet 2   No facility-administered medications prior to visit.    Radiology:   No results found.  Cardiac Studies:   Echocardiogram 09/02/2018:  Left ventricle cavity is normal in size. Moderate concentric hypertrophy  of the left ventricle. Moderate decrease in global wall motion. Visual EF  is 35-40%. Doppler evidence of grade I (impaired) diastolic dysfunction,  normal LAP. Calculated EF 38%.  Left atrial cavity is moderately dilated.  Mild aortic valve stenosis. Aortic valve mean gradient of 8 mmHg, Vmax of  2.0 m/s. Calculated aortic valve area by continuity equation is 1.9 cm.  Mild to moderate mitral regurgitation.  Mild tricuspid regurgitation.  Estimated pulmonary artery systolic pressure  is  28  mm Hg with RA pressure estimated at 3 mm Hg. Compared to 03/02/2010, EF previously normal and aortic stenosis is new.   Lexiscan myoview stress test 08/25/2018:  1. Lexiscan stress test with low level exercise was performed. Stress symptoms included chest pain, relieved with rest. Resting blood pressure was 150/98 mmHg and peak effect blood pressure was 148/90 mmHg. The resting electrocardiogram demonstrated normal sinus rhythm, normal resting conduction, low voltage, possible old anteroseptal infarct, no resting arrhythmias and normal rest repolarization.  The stress  electrocardiogram with low level exercise and lexiscan showed sinus rhythm, normal stress conduction, frequent PVC's and nonspecific ST-T changes.  Stress EKG is non diagnostic for ischemia as it is a pharmacologic stress.  2. The overall quality of the study is fair.  Left ventricular cavity is noted to be enlarged on the rest and stress studies.  Gated SPECT images reveal mild global decrease in myocardial thickening and wall motion.  The left ventricular ejection fraction was calculated or visually estimated to be 43%.  SPECT images reveal medium sized, moderate intensity perfusion defect seen worse on rest images. While this may be due to attenuation artifact, ischemia in this region cannot be excluded.  3. Intermediate risk study.  4. Compared to 02/24/2010, no significant change.   EKG  EKG 01/12/2020: Normal sinus rhythm at the rate of 60 bpm, left axis deviation, cannot exclude inferior infarct old.  Poor R wave progression, cannot exclude anteroseptal infarct old.  Diffuse nonspecific T abnormality.  Low-voltage complexes.  Pulmonary disease pattern.  EKG 07/23/2018: Sinus bradycardia at rate of 60 bpm with first-degree AV block.  Normal axis.  Poor R-wave progression, cannot exclude anteroseptal infarct old.  Nonspecific T abnormality.  Assessment     ICD-10-CM   1.  Resistant hypertension  I10 EKG 12-Lead    [facility] ECHOCARDIOGRAM COMPLETE  2. Coronary artery disease of native artery of native heart with stable angina pectoris (HCC)  I25.118   3. Abnormal nuclear stress test  R94.39   4. Chronic combined systolic and diastolic heart failure (HCC)  R74.08 [facility] ECHOCARDIOGRAM COMPLETE  5. Hypercholesteremia  E78.00      Medications Discontinued During This Encounter  Medication Reason  . BYSTOLIC 20 MG TABS Error  . isosorbide mononitrate (IMDUR) 60 MG 24 hr tablet Change in therapy  . Nebivolol HCl 20 MG TABS Reorder  . olmesartan (BENICAR) 20 MG tablet Reorder  . cloNIDine (CATAPRES) 0.2 MG tablet Discontinued by provider  . Nebivolol HCl 20 MG TABS     Meds ordered this encounter  Medications  . DISCONTD: Nebivolol HCl 20 MG TABS    Sig: Take 0.5 tablets (10 mg total) by mouth daily.    Dispense:  90 tablet    Refill:  3  . olmesartan (BENICAR) 20 MG tablet    Sig: Take 1 tablet (20 mg total) by mouth daily.    Dispense:  90 tablet    Refill:  3  . isosorbide dinitrate (ISORDIL) 30 MG tablet    Sig: Take 1 tablet (30 mg total) by mouth 3 (three) times daily.    Dispense:  90 tablet    Refill:  2  . hydrALAZINE (APRESOLINE) 25 MG tablet    Sig: Take 1 tablet (25 mg total) by mouth 3 (three) times daily.    Dispense:  90 tablet    Refill:  2  . Nebivolol HCl 20 MG TABS    Sig: Take 1 tablet (20 mg total) by mouth daily.    Dispense:  90 tablet    Refill:  3    Please not, 20 mg and not 10 mg    Recommendations:   ZAVEN KLEMENS is a 79 y.o.  Caucasian male with history of hypertension, gout, renal stones, hyperlipidemia. He has had difficulty in BP control.  Comes in for 1 year f/u.  Since being on clonidine initially had dry mouth but symptoms have resolved and states that as his blood pressure was relatively well controlled he has been  taking it once a day.  Few days ago he ran out of prescription for Imdur and Benicar.  He complains of  chronic mild dyspnea and arthritis.  No chest pain, dizziness or syncope.  He was lost to follow-up after his recent echocardiogram and stress test a year ago, his blood pressure today is uncontrolled but fortunately there is no clinical evidence of heart failure, no worsening dyspnea, no chest pain.  Echocardiogram performed a year ago had revealed moderate - severe LV systolic dysfunction.  I would like to change his medications, continue Bystolic at 20 mg daily, continue olmesartan 20 mg daily, discontinue isosorbide mononitrate and add isosorbide dinitrate 30 mg 3 times daily along with hydralazine 25 mg 1 p.o. 3 times daily.  Hopefully this will improve the blood pressure.  Given his age, would probably like to wean him off of clonidine and advised him to discontinue over the next 1 week after taking 1/2 tablet of 0.2 mg daily.  If indeed blood pressure control becomes an issue, we can always reinitiate this.  I would like to reobtain echocardiogram to evaluate his LV systolic function.  I may consider cardiac catheterization although his symptoms are minimal in view of new onset heart failure and cardiomyopathy and abnormal nuclear stress test.  I will also try to obtain his previously performed labs from PCP, it has been 1.5 years since I last saw the patient.  All his medications were reconciled.  This is a 40-minute encounter.   Yates Decamp, MD, [facility] 01/12/2020, 11:57 AM Office: [phone]

## 2020-01-18 ENCOUNTER — Other Ambulatory Visit: Payer: Self-pay | Admitting: Cardiology

## 2020-01-18 DIAGNOSIS — I10 Essential (primary) hypertension: Secondary | ICD-10-CM

## 2020-01-18 MED ORDER — OLMESARTAN MEDOXOMIL 20 MG PO TABS: 20.0000 mg | ORAL_TABLET | Freq: Every day | ORAL | 0 refills | Status: DC

## 2020-01-18 MED ORDER — ACEBUTOLOL HCL 400 MG PO CAPS: 400.0000 mg | ORAL_CAPSULE | Freq: Every day | ORAL | 1 refills | Status: DC

## 2020-01-18 NOTE — Telephone Encounter (Signed)
Call pharmacy to verify. Addressed SIG discrepancy. Co-pay of $253.20 for 90 d/s. Pt w/ hx of HFrEF. Previously tried labetalol with reported allergic reaction of "swollen lip". Previous hx of carvedilol use from 2011-2016. Pt unsure of prior use or prior ADRs to either previous two agents.   Current clonidine titration of 0.2 mg QOD in AM. Recent BP readings listed below. Pt notes that he has been out of his olmesartan since last week and is still waiting on his delivery from mail order.   BP: 180/93    62        175/80    62        162/84    60        160/94    58        140/77    57  Pt open to trying carvedilol if appropriate and if its cheaper for him. Will call and verify olmesartan delivery status with pt's pharmacy as well.

## 2020-01-18 NOTE — Telephone Encounter (Signed)
Patient called and advised that he is out of his Nebivolol 20 mg  . He needs it to be sent to pharmacy who is requesting script clarification . Also he would like a 90 day supply . Thanks

## 2020-01-18 NOTE — Telephone Encounter (Signed)
Reviewed with Dr. Jacinto Halim. Per Dr. Verl Dicker recommendation, will consider switching nebevolol to acebutolol. Called [employer] to follow up on Olmesartan Rx that was sent last week. Per pharmacy tech, Rx never processed. Requested Rx to be processed. Olmesartan to be delivered in 1 week timeframe. Emergency 14 day supply pended to pt's local pharmacy as well.

## 2020-01-18 NOTE — Telephone Encounter (Signed)
ICD-10-CM   1. Resistant hypertension  I10 acebutolol (SECTRAL) 400 MG capsule    olmesartan (BENICAR) 20 MG tablet     Yates Decamp, MD, [facility] 01/18/2020, 4:40 PM Office: [phone]

## 2020-01-19 DIAGNOSIS — I10 Essential (primary) hypertension: Secondary | ICD-10-CM | POA: Diagnosis not present

## 2020-02-01 ENCOUNTER — Ambulatory Visit: Payer: Medicare Other

## 2020-02-01 ENCOUNTER — Other Ambulatory Visit: Payer: Self-pay

## 2020-02-01 DIAGNOSIS — I10 Essential (primary) hypertension: Secondary | ICD-10-CM | POA: Diagnosis not present

## 2020-02-01 DIAGNOSIS — I5042 Chronic combined systolic (congestive) and diastolic (congestive) heart failure: Secondary | ICD-10-CM

## 2020-02-09 DIAGNOSIS — R1084 Generalized abdominal pain: Secondary | ICD-10-CM | POA: Diagnosis not present

## 2020-02-09 DIAGNOSIS — Z87442 Personal history of urinary calculi: Secondary | ICD-10-CM | POA: Diagnosis not present

## 2020-02-09 DIAGNOSIS — N281 Cyst of kidney, acquired: Secondary | ICD-10-CM | POA: Diagnosis not present

## 2020-02-12 ENCOUNTER — Ambulatory Visit: Payer: Medicare Other | Admitting: Cardiology

## 2020-02-15 NOTE — Progress Notes (Deleted)
Primary Physician/Referring:  Pearson Grippe, MD  Patient ID: Keith [name], male    DOB: 29-Jul-1940, 79 y.o.   MRN: [id number]  No chief complaint on file.  HPI:    DRUE HARR  is a 79 y.o. Caucasian male with history of hypertension, gout, renal stones, hyperlipidemia. He has had difficulty in BP control.    Comes in for 1 year f/u.  Since being on clonidine initially had dry mouth but symptoms have resolved and states that as his blood pressure was relatively well controlled he has been taking it once a day.  Few days ago he ran out of prescription for Imdur and Benicar.  He complains of chronic mild dyspnea and arthritis.  No chest pain, dizziness or syncope.  ***  Past Medical History:  Diagnosis Date  . Abnormal nuclear stress test 02/24/2010   Nuclear stress 02/24/10 Infero lateral ischemia   . Allergic rhinitis, cause unspecified    skin test pos 02-23-2008  . Angina pectoris (HCC)   . CAD (coronary artery disease)   . CKD (chronic kidney disease) stage 3, GFR 30-59 ml/min 07/23/2018  . Cough   . Dyspnea on exertion 07/23/2018  . GERD (gastroesophageal reflux disease)   . Hypercholesteremia 07/23/2018  . Ischemia   . Kidney stone   . Migraine   . Moderate obesity 07/23/2018  . Nephrolithiasis   . Resistant hypertension 07/23/2018  . TIA (transient ischemic attack)   . Unspecified essential hypertension    Past Surgical History:  Procedure Laterality Date  . bilateral tkr    . EXTRACORPOREAL SHOCK WAVE LITHOTRIPSY Right 03/02/2019   Procedure: EXTRACORPOREAL SHOCK WAVE LITHOTRIPSY (ESWL);  Surgeon: Crista Elliot, MD;  Location: [facility];  Service: Urology;  Laterality: Right;  . HERNIA REPAIR    . reset nasal fracture    . TONSILLECTOMY     Family History  Problem Relation Age of Onset  . Heart attack Father   . Stroke Father   . Allergy (severe) Mother   . Tuberculosis Other        grandmother    Social History   Tobacco Use  . Smoking status: Never Smoker    . Smokeless tobacco: Never Used  Substance Use Topics  . Alcohol use: No    Alcohol/week: 0.0 standard drinks    Comment: Pt used to have alcohol problems.   Marital Status: Married  ROS  Review of Systems  Cardiovascular: Positive for dyspnea on exertion. Negative for chest pain and leg swelling.  Musculoskeletal: Positive for arthritis and back pain.  Gastrointestinal: Negative for melena.  Genitourinary: Positive for frequency.   Objective  There were no vitals taken for this visit.  Vitals with BMI 01/12/2020 03/02/2019 03/02/2019  Height 5\' 8"  - 5\' 8"   Weight 224 lbs - 215 lbs 6 oz  BMI 34.07 - 32.76  Systolic 168 165  Diastolic 89 94 101  Pulse 58 54 53     Physical Exam Constitutional:      Appearance: He is obese.  HENT:     Head: Atraumatic.  Cardiovascular:     Rate and Rhythm: Normal rate and regular rhythm.     Pulses: Normal pulses and intact distal pulses.     Heart sounds: S1 normal and S2 normal. Murmur heard.  Harsh midsystolic murmur is present with a grade of 2/6 at the upper right sternal border.  No gallop.      Comments: Trace edema. No JVD.  Pulmonary:     Effort: Pulmonary effort is normal.     Breath sounds: Normal breath sounds.  Abdominal:     General: Bowel sounds are normal.     Palpations: Abdomen is soft.    Laboratory examination:   No flowsheet data found. CBC Latest Ref Rng & Units 01/23/2008  WBC 4.5 - 10.5 10*3/microliter 14.8(H)  Hemoglobin 13.0 - 17.0 g/dL 13.2  Hematocrit 44.0 - 52.0 % 48.5  Platelets 150 - 400 K/uL 192   Lipid Panel  No results found for: CHOL, TRIG, HDL, CHOLHDL, VLDL, LDLCALC, LDLDIRECT HEMOGLOBIN A1C No results found for: HGBA1C, MPG TSH No results for input(s): TSH in the last 8760 hours.  External labs:   TSH 3.130 09/01/2019   LDL-C 124.000 09/01/2019 Triglycerides 104.000 09/01/2019  Medications and allergies   Allergies  Allergen Reactions  . Amlodipine Swelling  . Labetalol  Swelling    Swollen lips   . Lisinopril Cough  . Penicillins Rash  . Procaine Hcl     REACTION: increased bp     Outpatient Medications Prior to Visit  Medication Sig Dispense Refill  . acebutolol (SECTRAL) 400 MG capsule Take 1 capsule (400 mg total) by mouth daily. 30 capsule 1  . hydrALAZINE (APRESOLINE) 25 MG tablet Take 1 tablet (25 mg total) by mouth 3 (three) times daily. 90 tablet 2  . HYDROcodone-acetaminophen (NORCO/VICODIN) 5-325 MG tablet Take 1 tablet by mouth every 6 (six) hours as needed for moderate pain. 10 tablet 0  . isosorbide dinitrate (ISORDIL) 30 MG tablet Take 1 tablet (30 mg total) by mouth 3 (three) times daily. 90 tablet 2  . olmesartan (BENICAR) 20 MG tablet Take 1 tablet (20 mg total) by mouth daily. 14 tablet 0  . pantoprazole (PROTONIX) 40 MG tablet Take 40 mg by mouth daily.       No facility-administered medications prior to visit.    Radiology:   No results found.  Cardiac Studies:   Lexiscan myoview stress test 08/25/2018:  1. Lexiscan stress test with low level exercise was performed. Stress symptoms included chest pain, relieved with rest. Resting blood pressure was 150/98 mmHg and peak effect blood pressure was 148/90 mmHg. The resting electrocardiogram demonstrated normal sinus rhythm, normal resting conduction, low voltage, possible old anteroseptal infarct, no resting arrhythmias and normal rest repolarization.  The stress electrocardiogram with low level exercise and lexiscan showed sinus rhythm, normal stress conduction, frequent PVC's and nonspecific ST-T changes.  Stress EKG is non diagnostic for ischemia as it is a pharmacologic stress.  2. The overall quality of the study is fair.  Left ventricular cavity is noted to be enlarged on the rest and stress studies.  Gated SPECT images reveal mild global decrease in myocardial thickening and wall motion.  The left ventricular ejection fraction was calculated or visually estimated to be 43%.  SPECT  images reveal medium sized, moderate intensity perfusion defect seen worse on rest images. While this may be due to attenuation artifact, ischemia in this region cannot be excluded.  3. Intermediate risk study.  4. Compared to 02/24/2010, no significant change.   Echocardiogram 02/01/2020:  Left ventricle cavity is normal in size. Moderate concentric hypertrophy of the left ventricle. Moderate global hypokinesis. LVEF 35-40%. Normal global wall motion. Doppler evidence of grade I (impaired) diastolic dysfunction, normal LAP.  Left atrial cavity is severely dilated.  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 Trace aortic stenosis.  Mild (Grade I) mitral regurgitation.  Mild tricuspid regurgitation. Estimated  pulmonary artery systolic pressure 29 mmHg.  No significant change compared to previous study on 09/02/2018.  EKG  EKG 01/12/2020: Normal sinus rhythm at the rate of 60 bpm, left axis deviation, cannot exclude inferior infarct old.  Poor R wave progression, cannot exclude anteroseptal infarct old.  Diffuse nonspecific T abnormality.  Low-voltage complexes.  Pulmonary disease pattern.  EKG 07/23/2018: Sinus bradycardia at rate of 60 bpm with first-degree AV block.  Normal axis.  Poor R-wave progression, cannot exclude anteroseptal infarct old.  Nonspecific T abnormality.  Assessment   No diagnosis found.   There are no discontinued medications.  No orders of the defined types were placed in this encounter.   Recommendations:   RENO CLASBY is a 79 y.o.  Caucasian male with history of hypertension, gout, renal stones, hyperlipidemia. He has had difficulty in BP control.    Comes in for 1 year f/u.  Since being on clonidine initially had dry mouth but symptoms have resolved and states that as his blood pressure was relatively well controlled he has been taking it once a day.  Few days ago he ran out of prescription for Imdur and Benicar.  He complains of chronic mild  dyspnea and arthritis.  No chest pain, dizziness or syncope.  He was lost to follow-up after his recent echocardiogram and stress test a year ago, his blood pressure today is uncontrolled but fortunately there is no clinical evidence of heart failure, no worsening dyspnea, no chest pain.  Echocardiogram performed a year ago had revealed moderate - severe LV systolic dysfunction.  I would like to change his medications, continue Bystolic at 20 mg daily, continue olmesartan 20 mg daily, discontinue isosorbide mononitrate and add isosorbide dinitrate 30 mg 3 times daily along with hydralazine 25 mg 1 p.o. 3 times daily.  Hopefully this will improve the blood pressure.  Given his age, would probably like to wean him off of clonidine and advised him to discontinue over the next 1 week after taking 1/2 tablet of 0.2 mg daily.  If indeed blood pressure control becomes an issue, we can always reinitiate this.  I would like to reobtain echocardiogram to evaluate his LV systolic function.  I may consider cardiac catheterization although his symptoms are minimal in view of new onset heart failure and cardiomyopathy and abnormal nuclear stress test.  I will also try to obtain his previously performed labs from PCP, it has been 1.5 years since I last saw the patient.  All his medications were reconciled.  This is a 40-minute encounter.  ***

## 2020-02-16 ENCOUNTER — Ambulatory Visit: Payer: Medicare Other | Admitting: Cardiology

## 2020-02-18 DIAGNOSIS — I10 Essential (primary) hypertension: Secondary | ICD-10-CM | POA: Diagnosis not present

## 2020-02-19 DIAGNOSIS — R053 Chronic cough: Secondary | ICD-10-CM | POA: Diagnosis not present

## 2020-02-19 DIAGNOSIS — R0982 Postnasal drip: Secondary | ICD-10-CM | POA: Diagnosis not present

## 2020-03-03 DIAGNOSIS — E7800 Pure hypercholesterolemia, unspecified: Secondary | ICD-10-CM | POA: Diagnosis not present

## 2020-03-03 DIAGNOSIS — E039 Hypothyroidism, unspecified: Secondary | ICD-10-CM | POA: Diagnosis not present

## 2020-03-03 DIAGNOSIS — R351 Nocturia: Secondary | ICD-10-CM | POA: Diagnosis not present

## 2020-03-03 DIAGNOSIS — R739 Hyperglycemia, unspecified: Secondary | ICD-10-CM | POA: Diagnosis not present

## 2020-03-03 DIAGNOSIS — I10 Essential (primary) hypertension: Secondary | ICD-10-CM | POA: Diagnosis not present

## 2020-03-20 DIAGNOSIS — I10 Essential (primary) hypertension: Secondary | ICD-10-CM | POA: Diagnosis not present

## 2020-03-22 DIAGNOSIS — R739 Hyperglycemia, unspecified: Secondary | ICD-10-CM | POA: Diagnosis not present

## 2020-03-22 DIAGNOSIS — E039 Hypothyroidism, unspecified: Secondary | ICD-10-CM | POA: Diagnosis not present

## 2020-03-22 DIAGNOSIS — M5459 Other low back pain: Secondary | ICD-10-CM | POA: Diagnosis not present

## 2020-03-22 DIAGNOSIS — M19011 Primary osteoarthritis, right shoulder: Secondary | ICD-10-CM | POA: Diagnosis not present

## 2020-03-22 DIAGNOSIS — M47814 Spondylosis without myelopathy or radiculopathy, thoracic region: Secondary | ICD-10-CM | POA: Diagnosis not present

## 2020-03-22 DIAGNOSIS — R0781 Pleurodynia: Secondary | ICD-10-CM | POA: Diagnosis not present

## 2020-03-22 DIAGNOSIS — Z23 Encounter for immunization: Secondary | ICD-10-CM | POA: Diagnosis not present

## 2020-03-22 DIAGNOSIS — R609 Edema, unspecified: Secondary | ICD-10-CM | POA: Diagnosis not present

## 2020-03-22 DIAGNOSIS — E7800 Pure hypercholesterolemia, unspecified: Secondary | ICD-10-CM | POA: Diagnosis not present

## 2020-03-22 DIAGNOSIS — J301 Allergic rhinitis due to pollen: Secondary | ICD-10-CM | POA: Diagnosis not present

## 2020-03-22 DIAGNOSIS — I2510 Atherosclerotic heart disease of native coronary artery without angina pectoris: Secondary | ICD-10-CM | POA: Diagnosis not present

## 2020-03-22 DIAGNOSIS — M546 Pain in thoracic spine: Secondary | ICD-10-CM | POA: Diagnosis not present

## 2020-03-22 DIAGNOSIS — I10 Essential (primary) hypertension: Secondary | ICD-10-CM | POA: Diagnosis not present

## 2020-03-29 ENCOUNTER — Ambulatory Visit: Payer: Medicare Other | Admitting: Cardiology

## 2020-03-29 ENCOUNTER — Encounter: Payer: Self-pay | Admitting: Cardiology

## 2020-03-29 ENCOUNTER — Other Ambulatory Visit: Payer: Self-pay

## 2020-03-29 VITALS — BP 172/100 | HR 66 | Resp 16 | Ht 68.0 in | Wt 222.0 lb

## 2020-03-29 DIAGNOSIS — I428 Other cardiomyopathies: Secondary | ICD-10-CM

## 2020-03-29 DIAGNOSIS — N1831 Chronic kidney disease, stage 3a: Secondary | ICD-10-CM

## 2020-03-29 DIAGNOSIS — I110 Hypertensive heart disease with heart failure: Secondary | ICD-10-CM

## 2020-03-29 DIAGNOSIS — I10 Essential (primary) hypertension: Secondary | ICD-10-CM | POA: Diagnosis not present

## 2020-03-29 DIAGNOSIS — I25118 Atherosclerotic heart disease of native coronary artery with other forms of angina pectoris: Secondary | ICD-10-CM | POA: Diagnosis not present

## 2020-03-29 NOTE — Progress Notes (Signed)
Primary Physician/Referring:  Jani Gravel, MD  Patient ID: Keith [name], male    DOB: 07/28/1940, 79 y.o.   MRN: [id number]  Chief Complaint  Patient presents with  . Hypertension  . Congestive Heart Failure  . Follow-up    4 week   HPI:    Keith [name]  is a 79 y.o. Caucasian male with history of hypertension, gout, renal stones, mild obesity, nonischemic cardiomyopathy with moderate LV systolic dysfunction by negative nuclear stress test in April 2020 and also back echocardiogram at the same time and again in October 2021 revealing EF 35 to 40% with global hypokinesis.  He has had difficulty in BP control.  He is presently enrolled in RPM, blood pressure recordings since starting isosorbide dinitrate and hydralazine has been well controlled but recently discontinued this a week ago due to headache.    He also complains of severe right flank pain.  He was ruled out for any urinary stones that he has had previously.  States that only in the past 2 days his blood pressure has been uncontrolled.  Dyspnea has remained stable, no PND or orthopnea or leg edema.  Past Medical History:  Diagnosis Date  . Abnormal nuclear stress test 02/24/2010   Nuclear stress 02/24/10 Infero lateral ischemia   . Allergic rhinitis, cause unspecified    skin test pos 02-23-2008  . Angina pectoris ([place])   . CAD (coronary artery disease)   . CKD (chronic kidney disease) stage 3, GFR 30-59 ml/min ([place]) 07/23/2018  . Cough   . Dyspnea on exertion 07/23/2018  . GERD (gastroesophageal reflux disease)   . Hypercholesteremia 07/23/2018  . Ischemia   . Kidney stone   . Migraine   . Moderate obesity 07/23/2018  . Nephrolithiasis   . Resistant hypertension 07/23/2018  . TIA (transient ischemic attack)   . Unspecified essential hypertension    Past Surgical History:  Procedure Laterality Date  . bilateral tkr    . EXTRACORPOREAL SHOCK WAVE LITHOTRIPSY Right 03/02/2019   Procedure: EXTRACORPOREAL SHOCK WAVE  LITHOTRIPSY (ESWL);  Surgeon: Lucas Mallow, MD;  Location: [facility];  Service: Urology;  Laterality: Right;  . HERNIA REPAIR    . reset nasal fracture    . TONSILLECTOMY     Family History  Problem Relation Age of Onset  . Heart attack Father   . Stroke Father   . Allergy (severe) Mother   . Tuberculosis Other        grandmother    Social History   Tobacco Use  . Smoking status: Never Smoker  . Smokeless tobacco: Never Used  Substance Use Topics  . Alcohol use: No    Alcohol/week: 0.0 standard drinks    Comment: Pt used to have alcohol problems.   Marital Status: Married  ROS  Review of Systems  Cardiovascular: Positive for dyspnea on exertion. Negative for chest pain and leg swelling.  Musculoskeletal: Positive for arthritis and back pain.  Gastrointestinal: Negative for melena.  Genitourinary: Positive for frequency.  Neurological: Positive for vertigo (chronic).   Objective  Blood pressure (!) 172/100, pulse 66, resp. rate 16, height 5' 8"  (1.727 m), weight 222 lb (100.7 kg), SpO2 95 %.  Vitals with BMI 03/29/2020 01/12/2020 03/02/2019  Height 5' 8"  5' 8"  -  Weight 222 lbs 224 lbs -  BMI 25.42 70.62 -  Systolic 376 283 151  Diastolic 761 89 94  Pulse 66 58 54     Physical Exam Constitutional:  Appearance: He is obese.  HENT:     Head: Atraumatic.  Cardiovascular:     Rate and Rhythm: Normal rate and regular rhythm.     Pulses: Normal pulses and intact distal pulses.     Heart sounds: S1 normal and S2 normal. Murmur heard.  Harsh midsystolic murmur is present with a grade of 2/6 at the upper right sternal border.  No gallop.      Comments: Trace edema. No JVD.  Pulmonary:     Effort: Pulmonary effort is normal.     Breath sounds: Normal breath sounds.  Abdominal:     General: Bowel sounds are normal.     Palpations: Abdomen is soft.    Laboratory examination:   No results for input(s): NA, K, CL, CO2, GLUCOSE, BUN, CREATININE, CALCIUM, GFRNONAA,  GFRAA in the last 8760 hours. CrCl cannot be calculated (No successful lab value found.).  No flowsheet data found. CBC Latest Ref Rng & Units 01/23/2008  WBC 4.5 - 10.5 10*3/microliter 14.8(H)  Hemoglobin 13.0 - 17.0 g/dL 16.9  Hematocrit 39.0 - 52.0 % 48.5  Platelets 150 - 400 K/uL 192    Lipid Panel No results for input(s): CHOL, TRIG, LDLCALC, VLDL, HDL, CHOLHDL, LDLDIRECT in the last 8760 hours.  HEMOGLOBIN A1C No results found for: HGBA1C, MPG TSH No results for input(s): TSH in the last 8760 hours.  External labs:   Labs 03/03/2020:  Hb 15.0/HCT 46.4, platelets 155, normal indicis.  Sodium 146, serum glucose 94 mg, potassium 4.7, BUN 13.7/creatinine 1.6, EGFR 44 mL.  CMP otherwise normal.  A1c 5.6%.  TSH normal at 3.13.  Total cholesterol 168, triglycerides 142, HDL 38, LDL 102.  Non-HDL cholesterol 130.  Urine analysis normal.  TSH 3.130 09/01/2019   LDL-C 124.000 09/01/2019 Triglycerides 104.000 09/01/2019  Medications and allergies   Allergies  Allergen Reactions  . Amlodipine Swelling  . Labetalol Swelling    Swollen lips   . Lisinopril Cough  . Penicillins Rash  . Procaine Hcl     REACTION: increased bp     Outpatient Medications Prior to Visit  Medication Sig Dispense Refill  . acebutolol (SECTRAL) 400 MG capsule Take 1 capsule (400 mg total) by mouth daily. 30 capsule 1  . hydrALAZINE (APRESOLINE) 25 MG tablet Take 1 tablet (25 mg total) by mouth 3 (three) times daily. 90 tablet 2  . HYDROcodone-acetaminophen (NORCO/VICODIN) 5-325 MG tablet Take 1 tablet by mouth every 6 (six) hours as needed for moderate pain. 10 tablet 0  . isosorbide dinitrate (ISORDIL) 30 MG tablet Take 1 tablet (30 mg total) by mouth 3 (three) times daily. 90 tablet 2  . olmesartan (BENICAR) 20 MG tablet Take 1 tablet (20 mg total) by mouth daily. 14 tablet 0  . pantoprazole (PROTONIX) 40 MG tablet Take 40 mg by mouth daily.       No facility-administered medications prior to  visit.   Radiology:   No results found.  Cardiac Studies:   Lexiscan myoview stress test 08/25/2018:  1. Lexiscan stress test with low level exercise was performed. Stress symptoms included chest pain, relieved with rest. Resting blood pressure was 150/98 mmHg and peak effect blood pressure was 148/90 mmHg. The resting electrocardiogram demonstrated normal sinus rhythm, normal resting conduction, low voltage, possible old anteroseptal infarct, no resting arrhythmias and normal rest repolarization.  The stress electrocardiogram with low level exercise and lexiscan showed sinus rhythm, normal stress conduction, frequent PVC's and nonspecific ST-T changes.  Stress EKG is non diagnostic  for ischemia as it is a pharmacologic stress.  2. The overall quality of the study is fair.  Left ventricular cavity is noted to be enlarged on the rest and stress studies.  Gated SPECT images reveal mild global decrease in myocardial thickening and wall motion.  The left ventricular ejection fraction was calculated or visually estimated to be 43%.  SPECT images reveal medium sized, moderate intensity perfusion defect seen worse on rest images. While this may be due to attenuation artifact, ischemia in this region cannot be excluded.  3. Intermediate risk study.  4. Compared to 02/24/2010, no significant change.   Echocardiogram 02/01/2020: Left ventricle cavity is normal in size. Moderate concentric hypertrophy of the left ventricle. Moderate global hypokinesis. LVEF 35-40%. Normal global wall motion. Doppler evidence of grade I (impaired) diastolic dysfunction, normal LAP.  Left atrial cavity is severely dilated. 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 Trace aortic stenosis. Mild (Grade I) mitral regurgitation. Mild tricuspid regurgitation. Estimated pulmonary artery systolic pressure 29 mmHg. No significant change compared to previous study on 09/02/2018.  EKG  EKG 01/12/2020: Normal sinus  rhythm at the rate of 60 bpm, left axis deviation, cannot exclude inferior infarct old.  Poor R wave progression, cannot exclude anteroseptal infarct old.  Diffuse nonspecific T abnormality.  Low-voltage complexes.  Pulmonary disease pattern.  EKG 07/23/2018: Sinus bradycardia at rate of 60 bpm with first-degree AV block.  Normal axis.  Poor R-wave progression, cannot exclude anteroseptal infarct old.  Nonspecific T abnormality.  Assessment     ICD-10-CM   1. Resistant hypertension  I10   2. Non-ischemic cardiomyopathy ([place])  I42.8   3. Hypertensive heart disease with heart failure (HCC)  I11.0   4. Stage 3a chronic kidney disease (HCC)  N18.31     There are no discontinued medications.  No orders of the defined types were placed in this encounter.  Recommendations:   Keith [name] is a 79 y.o.  Caucasian male with history of hypertension, gout, renal stones, hyperlipidemia. He has had difficulty in BP control.  I suspect nonischemic cardiomyopathy in view of global hypokinesis and uncontrolled hypertension and nuclear stress test revealing no focal abnormality.  I may consider cardiac catheterization if he develops worsening symptoms of dyspnea.  He had excellent control of blood pressure however discontinued isosorbide dinitrate and hydralazine as he was having headaches.  He will try to reinitiate isosorbide dinitrate 1 pill once a day in the evening but will resume taking hydralazine 3 times a day and will slowly uptitrate ISDN as tolerated.  He is enrolled in the patient in Remote Patient Monitoring and Principal Care Management as patient is high risk for hospitalization and complications from underlying medical conditions. He will continue remote transmission of his BP recordings and will adjust the medications as tolerated.    Adrian Prows, MD, [facility] 03/31/2020, 9:48 PM Office: [phone]

## 2020-03-30 DIAGNOSIS — J310 Chronic rhinitis: Secondary | ICD-10-CM | POA: Diagnosis not present

## 2020-03-30 DIAGNOSIS — J343 Hypertrophy of nasal turbinates: Secondary | ICD-10-CM | POA: Diagnosis not present

## 2020-03-31 ENCOUNTER — Ambulatory Visit: Payer: Medicare Other | Admitting: Cardiology

## 2020-04-01 ENCOUNTER — Other Ambulatory Visit: Payer: Self-pay | Admitting: Cardiology

## 2020-04-01 DIAGNOSIS — I10 Essential (primary) hypertension: Secondary | ICD-10-CM

## 2020-04-04 DIAGNOSIS — M545 Low back pain, unspecified: Secondary | ICD-10-CM | POA: Diagnosis not present

## 2020-04-05 ENCOUNTER — Telehealth: Payer: Self-pay | Admitting: Student

## 2020-04-05 DIAGNOSIS — I10 Essential (primary) hypertension: Secondary | ICD-10-CM

## 2020-04-05 DIAGNOSIS — N1831 Chronic kidney disease, stage 3a: Secondary | ICD-10-CM | POA: Diagnosis not present

## 2020-04-05 MED ORDER — HYDROCHLOROTHIAZIDE 12.5 MG PO CAPS: 12.5000 mg | ORAL_CAPSULE | Freq: Every day | ORAL | 3 refills | Status: DC

## 2020-04-05 NOTE — Telephone Encounter (Signed)
Patient called to report since restarting isosorbide dinitrate he has had lower lip swelling and mouth sores which she attributes to the medication.  Instructed patient to stop isosorbide dinitrate, but to continue his other medications including acebutolol, hydralazine, and olmesartan.  Patient's blood pressure was markedly elevated on previous office visit, therefore will prescribe hydrochlorothiazide 12.5 mg daily in substitution for isosorbide dinitrate.  Will obtain BMP in 1 week, patient does have baseline stage III chronic kidney disease.  Patient is currently enrolled in remote monitoring of blood pressure with our office, will continue this.  Also instructed patient to schedule follow-up visit with his primary care provider to further evaluate potential underlying etiology of lip swelling and mouth sores as this is not a common side effect of isosorbide dinitrate.  Suspicion for angioedema is very low as patient is on olmesartan, which is an ARB and not an ACE inhibitor.  However counseled patient regarding need for emergent evaluation in the hospital emergency department if he experiences worsening lip swelling, tongue swelling, or difficulty breathing.  Patient verbalized understanding and agreement.  Stage 3a chronic kidney disease (HCC) - Plan: hydrochlorothiazide (MICROZIDE) 12.5 MG capsule, Basic metabolic panel  Resistant hypertension - Plan: hydrochlorothiazide (MICROZIDE) 12.5 MG capsule, Basic metabolic panel

## 2020-04-13 DIAGNOSIS — Z23 Encounter for immunization: Secondary | ICD-10-CM | POA: Diagnosis not present

## 2020-04-18 ENCOUNTER — Other Ambulatory Visit: Payer: Self-pay | Admitting: Student

## 2020-04-18 DIAGNOSIS — I10 Essential (primary) hypertension: Secondary | ICD-10-CM | POA: Diagnosis not present

## 2020-04-18 DIAGNOSIS — N1831 Chronic kidney disease, stage 3a: Secondary | ICD-10-CM | POA: Diagnosis not present

## 2020-04-19 DIAGNOSIS — I10 Essential (primary) hypertension: Secondary | ICD-10-CM | POA: Diagnosis not present

## 2020-04-19 LAB — BASIC METABOLIC PANEL
BUN/Creatinine Ratio: 16 (ref 10–24)
BUN: 25 mg/dL (ref 8–27)
CO2: 26 mmol/L (ref 20–29)
Calcium: 9.7 mg/dL (ref 8.6–10.2)
Chloride: 100 mmol/L (ref 96–106)
Creatinine, Ser: 1.52 mg/dL — ABNORMAL HIGH (ref 0.76–1.27)
GFR calc Af Amer: 49 mL/min/{1.73_m2} — ABNORMAL LOW (ref >59)
GFR calc non Af Amer: 43 mL/min/{1.73_m2} — ABNORMAL LOW (ref >59)
Glucose: 99 mg/dL (ref 65–99)
Potassium: 4.1 mmol/L (ref 3.5–5.2)
Sodium: 142 mmol/L (ref 134–144)

## 2020-04-19 NOTE — Progress Notes (Signed)
Please inform patient his renal function and electrolytes are normal. Continue taking HCT.

## 2020-04-20 NOTE — Progress Notes (Signed)
Called patient, Keith [name], LMAM.

## 2020-04-20 NOTE — Progress Notes (Signed)
Spoke with patient wife, she said someone called patient this morning and gave them resutls. I confirmed the message with her.

## 2020-05-04 ENCOUNTER — Telehealth: Payer: Self-pay | Admitting: Pharmacist

## 2020-05-04 NOTE — Telephone Encounter (Signed)
Reviewed recent remote BP readings with pt and Celeste. Concerns of recent lip swelling and mouth sores still present since stopping isosorbide. Pt believes that it might be attributed more to him biting his lips while he is sleeping and his sleep posture. Improved with sleeping on his back. Pt hasnt scheduled a follow up OV with his PCP to address the lip swelling yet. Pt reports that he had a sleep study completed a long time ago which didn't indicate any sleep apnea but states that he had several difficulty with getting situated during the procedure.   Pt also complaining of severe back pain that has been aggravating him as well. Orthopedic already assessed the pain and recommended against any surgery. Pt was told to continue resting. Pt was provided PRN Norco by PCP to help with pain. Pt also started using a back brace that is providing mild relief.   BP previously had been stable and near goal with isosorbide once daily in the evening. Pt agreeable to starting back on isosorbide 30 mg every day in the evening. Pt aware to notify the office if he experiences any lip/tounge swelling, trouble breathing, severe hives, etc. Pt still has his old isosorbide bottle and agreeable to adding it to his evening dose today. Pt denies any complains of CP, HA, SOB, DOE, edema. Will continue to monitor remote BP readings and follow up as needed. Reviewed with Park Meo and who agreed with the plan.

## 2020-05-24 DIAGNOSIS — H9203 Otalgia, bilateral: Secondary | ICD-10-CM | POA: Diagnosis not present

## 2020-05-24 DIAGNOSIS — J310 Chronic rhinitis: Secondary | ICD-10-CM | POA: Diagnosis not present

## 2020-05-30 ENCOUNTER — Other Ambulatory Visit: Payer: Self-pay | Admitting: Cardiology

## 2020-06-14 DIAGNOSIS — H353131 Nonexudative age-related macular degeneration, bilateral, early dry stage: Secondary | ICD-10-CM | POA: Diagnosis not present

## 2020-06-14 DIAGNOSIS — H524 Presbyopia: Secondary | ICD-10-CM | POA: Diagnosis not present

## 2020-06-14 DIAGNOSIS — Z961 Presence of intraocular lens: Secondary | ICD-10-CM | POA: Diagnosis not present

## 2020-06-15 DIAGNOSIS — R739 Hyperglycemia, unspecified: Secondary | ICD-10-CM | POA: Diagnosis not present

## 2020-06-15 DIAGNOSIS — I10 Essential (primary) hypertension: Secondary | ICD-10-CM | POA: Diagnosis not present

## 2020-06-15 DIAGNOSIS — E039 Hypothyroidism, unspecified: Secondary | ICD-10-CM | POA: Diagnosis not present

## 2020-06-22 DIAGNOSIS — H938X9 Other specified disorders of ear, unspecified ear: Secondary | ICD-10-CM | POA: Diagnosis not present

## 2020-06-22 DIAGNOSIS — I129 Hypertensive chronic kidney disease with stage 1 through stage 4 chronic kidney disease, or unspecified chronic kidney disease: Secondary | ICD-10-CM | POA: Diagnosis not present

## 2020-06-22 DIAGNOSIS — R053 Chronic cough: Secondary | ICD-10-CM | POA: Diagnosis not present

## 2020-06-22 DIAGNOSIS — E7800 Pure hypercholesterolemia, unspecified: Secondary | ICD-10-CM | POA: Diagnosis not present

## 2020-06-22 DIAGNOSIS — I5030 Unspecified diastolic (congestive) heart failure: Secondary | ICD-10-CM | POA: Diagnosis not present

## 2020-06-22 DIAGNOSIS — I2510 Atherosclerotic heart disease of native coronary artery without angina pectoris: Secondary | ICD-10-CM | POA: Diagnosis not present

## 2020-06-22 DIAGNOSIS — M1990 Unspecified osteoarthritis, unspecified site: Secondary | ICD-10-CM | POA: Diagnosis not present

## 2020-07-21 DIAGNOSIS — I10 Essential (primary) hypertension: Secondary | ICD-10-CM | POA: Diagnosis not present

## 2020-08-23 DIAGNOSIS — R3912 Poor urinary stream: Secondary | ICD-10-CM | POA: Diagnosis not present

## 2020-08-23 DIAGNOSIS — N401 Enlarged prostate with lower urinary tract symptoms: Secondary | ICD-10-CM | POA: Diagnosis not present

## 2020-08-23 DIAGNOSIS — N281 Cyst of kidney, acquired: Secondary | ICD-10-CM | POA: Diagnosis not present

## 2020-09-06 ENCOUNTER — Other Ambulatory Visit: Payer: Self-pay | Admitting: Cardiology

## 2020-09-19 DIAGNOSIS — I10 Essential (primary) hypertension: Secondary | ICD-10-CM | POA: Diagnosis not present

## 2020-09-20 DIAGNOSIS — R3912 Poor urinary stream: Secondary | ICD-10-CM | POA: Diagnosis not present

## 2020-09-20 DIAGNOSIS — R1084 Generalized abdominal pain: Secondary | ICD-10-CM | POA: Diagnosis not present

## 2020-09-20 DIAGNOSIS — N3289 Other specified disorders of bladder: Secondary | ICD-10-CM | POA: Diagnosis not present

## 2020-09-20 DIAGNOSIS — K429 Umbilical hernia without obstruction or gangrene: Secondary | ICD-10-CM | POA: Diagnosis not present

## 2020-09-20 DIAGNOSIS — N281 Cyst of kidney, acquired: Secondary | ICD-10-CM | POA: Diagnosis not present

## 2020-09-20 DIAGNOSIS — K4090 Unilateral inguinal hernia, without obstruction or gangrene, not specified as recurrent: Secondary | ICD-10-CM | POA: Diagnosis not present

## 2020-10-04 DIAGNOSIS — H2000 Unspecified acute and subacute iridocyclitis: Secondary | ICD-10-CM | POA: Diagnosis not present

## 2020-10-18 DIAGNOSIS — H353131 Nonexudative age-related macular degeneration, bilateral, early dry stage: Secondary | ICD-10-CM | POA: Diagnosis not present

## 2020-10-19 DIAGNOSIS — I10 Essential (primary) hypertension: Secondary | ICD-10-CM | POA: Diagnosis not present

## 2020-10-21 DIAGNOSIS — M9904 Segmental and somatic dysfunction of sacral region: Secondary | ICD-10-CM | POA: Diagnosis not present

## 2020-10-21 DIAGNOSIS — M9903 Segmental and somatic dysfunction of lumbar region: Secondary | ICD-10-CM | POA: Diagnosis not present

## 2020-10-21 DIAGNOSIS — M5451 Vertebrogenic low back pain: Secondary | ICD-10-CM | POA: Diagnosis not present

## 2020-10-21 DIAGNOSIS — M9905 Segmental and somatic dysfunction of pelvic region: Secondary | ICD-10-CM | POA: Diagnosis not present

## 2020-10-24 DIAGNOSIS — M9903 Segmental and somatic dysfunction of lumbar region: Secondary | ICD-10-CM | POA: Diagnosis not present

## 2020-10-24 DIAGNOSIS — M9904 Segmental and somatic dysfunction of sacral region: Secondary | ICD-10-CM | POA: Diagnosis not present

## 2020-10-24 DIAGNOSIS — M5451 Vertebrogenic low back pain: Secondary | ICD-10-CM | POA: Diagnosis not present

## 2020-10-24 DIAGNOSIS — M9905 Segmental and somatic dysfunction of pelvic region: Secondary | ICD-10-CM | POA: Diagnosis not present

## 2020-10-26 ENCOUNTER — Other Ambulatory Visit: Payer: Self-pay | Admitting: Student

## 2020-10-26 DIAGNOSIS — M9903 Segmental and somatic dysfunction of lumbar region: Secondary | ICD-10-CM | POA: Diagnosis not present

## 2020-10-26 DIAGNOSIS — M9904 Segmental and somatic dysfunction of sacral region: Secondary | ICD-10-CM | POA: Diagnosis not present

## 2020-10-26 DIAGNOSIS — I10 Essential (primary) hypertension: Secondary | ICD-10-CM

## 2020-10-26 DIAGNOSIS — M9905 Segmental and somatic dysfunction of pelvic region: Secondary | ICD-10-CM | POA: Diagnosis not present

## 2020-10-26 DIAGNOSIS — N1831 Chronic kidney disease, stage 3a: Secondary | ICD-10-CM

## 2020-10-28 DIAGNOSIS — M9904 Segmental and somatic dysfunction of sacral region: Secondary | ICD-10-CM | POA: Diagnosis not present

## 2020-10-28 DIAGNOSIS — M9903 Segmental and somatic dysfunction of lumbar region: Secondary | ICD-10-CM | POA: Diagnosis not present

## 2020-10-28 DIAGNOSIS — M9905 Segmental and somatic dysfunction of pelvic region: Secondary | ICD-10-CM | POA: Diagnosis not present

## 2020-10-31 DIAGNOSIS — M9904 Segmental and somatic dysfunction of sacral region: Secondary | ICD-10-CM | POA: Diagnosis not present

## 2020-10-31 DIAGNOSIS — M9905 Segmental and somatic dysfunction of pelvic region: Secondary | ICD-10-CM | POA: Diagnosis not present

## 2020-10-31 DIAGNOSIS — M9903 Segmental and somatic dysfunction of lumbar region: Secondary | ICD-10-CM | POA: Diagnosis not present

## 2020-11-02 DIAGNOSIS — M9904 Segmental and somatic dysfunction of sacral region: Secondary | ICD-10-CM | POA: Diagnosis not present

## 2020-11-02 DIAGNOSIS — M9903 Segmental and somatic dysfunction of lumbar region: Secondary | ICD-10-CM | POA: Diagnosis not present

## 2020-11-02 DIAGNOSIS — M9905 Segmental and somatic dysfunction of pelvic region: Secondary | ICD-10-CM | POA: Diagnosis not present

## 2020-11-04 DIAGNOSIS — M9904 Segmental and somatic dysfunction of sacral region: Secondary | ICD-10-CM | POA: Diagnosis not present

## 2020-11-04 DIAGNOSIS — M9903 Segmental and somatic dysfunction of lumbar region: Secondary | ICD-10-CM | POA: Diagnosis not present

## 2020-11-04 DIAGNOSIS — M9905 Segmental and somatic dysfunction of pelvic region: Secondary | ICD-10-CM | POA: Diagnosis not present

## 2020-11-07 DIAGNOSIS — M9905 Segmental and somatic dysfunction of pelvic region: Secondary | ICD-10-CM | POA: Diagnosis not present

## 2020-11-07 DIAGNOSIS — M9904 Segmental and somatic dysfunction of sacral region: Secondary | ICD-10-CM | POA: Diagnosis not present

## 2020-11-07 DIAGNOSIS — M9903 Segmental and somatic dysfunction of lumbar region: Secondary | ICD-10-CM | POA: Diagnosis not present

## 2020-11-09 DIAGNOSIS — M9903 Segmental and somatic dysfunction of lumbar region: Secondary | ICD-10-CM | POA: Diagnosis not present

## 2020-11-09 DIAGNOSIS — M9905 Segmental and somatic dysfunction of pelvic region: Secondary | ICD-10-CM | POA: Diagnosis not present

## 2020-11-09 DIAGNOSIS — M9904 Segmental and somatic dysfunction of sacral region: Secondary | ICD-10-CM | POA: Diagnosis not present

## 2020-11-11 DIAGNOSIS — M9903 Segmental and somatic dysfunction of lumbar region: Secondary | ICD-10-CM | POA: Diagnosis not present

## 2020-11-11 DIAGNOSIS — M9904 Segmental and somatic dysfunction of sacral region: Secondary | ICD-10-CM | POA: Diagnosis not present

## 2020-11-11 DIAGNOSIS — M9905 Segmental and somatic dysfunction of pelvic region: Secondary | ICD-10-CM | POA: Diagnosis not present

## 2020-11-14 DIAGNOSIS — M9905 Segmental and somatic dysfunction of pelvic region: Secondary | ICD-10-CM | POA: Diagnosis not present

## 2020-11-14 DIAGNOSIS — M9903 Segmental and somatic dysfunction of lumbar region: Secondary | ICD-10-CM | POA: Diagnosis not present

## 2020-11-14 DIAGNOSIS — M9904 Segmental and somatic dysfunction of sacral region: Secondary | ICD-10-CM | POA: Diagnosis not present

## 2020-11-14 DIAGNOSIS — M5451 Vertebrogenic low back pain: Secondary | ICD-10-CM | POA: Diagnosis not present

## 2020-11-16 DIAGNOSIS — M9905 Segmental and somatic dysfunction of pelvic region: Secondary | ICD-10-CM | POA: Diagnosis not present

## 2020-11-16 DIAGNOSIS — M9904 Segmental and somatic dysfunction of sacral region: Secondary | ICD-10-CM | POA: Diagnosis not present

## 2020-11-16 DIAGNOSIS — M9903 Segmental and somatic dysfunction of lumbar region: Secondary | ICD-10-CM | POA: Diagnosis not present

## 2020-11-17 DIAGNOSIS — H04212 Epiphora due to excess lacrimation, left lacrimal gland: Secondary | ICD-10-CM | POA: Diagnosis not present

## 2020-11-17 DIAGNOSIS — H04122 Dry eye syndrome of left lacrimal gland: Secondary | ICD-10-CM | POA: Diagnosis not present

## 2020-11-17 DIAGNOSIS — I10 Essential (primary) hypertension: Secondary | ICD-10-CM | POA: Diagnosis not present

## 2020-11-17 DIAGNOSIS — H353131 Nonexudative age-related macular degeneration, bilateral, early dry stage: Secondary | ICD-10-CM | POA: Diagnosis not present

## 2020-11-22 ENCOUNTER — Other Ambulatory Visit: Payer: Self-pay

## 2020-11-22 ENCOUNTER — Other Ambulatory Visit: Payer: Self-pay | Admitting: Cardiology

## 2020-11-22 ENCOUNTER — Ambulatory Visit: Payer: Medicare Other | Admitting: Cardiology

## 2020-11-22 ENCOUNTER — Encounter: Payer: Self-pay | Admitting: Cardiology

## 2020-11-22 VITALS — BP 202/113 | HR 67 | Ht 68.0 in | Wt 220.0 lb

## 2020-11-22 DIAGNOSIS — I110 Hypertensive heart disease with heart failure: Secondary | ICD-10-CM

## 2020-11-22 DIAGNOSIS — N1831 Chronic kidney disease, stage 3a: Secondary | ICD-10-CM | POA: Diagnosis not present

## 2020-11-22 DIAGNOSIS — I10 Essential (primary) hypertension: Secondary | ICD-10-CM

## 2020-11-22 DIAGNOSIS — I428 Other cardiomyopathies: Secondary | ICD-10-CM | POA: Diagnosis not present

## 2020-11-22 DIAGNOSIS — I25118 Atherosclerotic heart disease of native coronary artery with other forms of angina pectoris: Secondary | ICD-10-CM | POA: Diagnosis not present

## 2020-11-22 MED ORDER — HYDRALAZINE HCL 25 MG PO TABS: 100.0000 mg | ORAL_TABLET | Freq: Three times a day (TID) | ORAL | 1 refills | Status: DC

## 2020-11-22 NOTE — Progress Notes (Signed)
Primary Physician/Referring:  Janie Morning, DO  Patient ID: Keith [name], male    DOB: [birth year], 80 y.o.   MRN: [id number]  Chief Complaint  Patient presents with   Congestive Heart Failure   Follow-up   Hypertension   HPI:    Keith [name]  is a 80 y.o. Caucasian male with history of hypertension, gout, renal stones, hyperlipidemia. He has had difficulty in BP control.  I suspect nonischemic cardiomyopathy in view of global hypokinesis and uncontrolled hypertension and nuclear stress test revealing no focal abnormality.  He is enrolled in the patient in Remote Patient Monitoring and Principal Care Management as patient is high risk for hospitalization and complications from underlying medical conditions.   This is 34-monthoffice visit. He has had difficulty in BP control.  He is presently enrolled in PCM & RPM.  His home recordings of blood pressure has been only moderately well controlled.  There have been several episodes of marked elevated blood pressure.  Patient has been compliant with taking his medications, states that he eats at home.  No significant change in his weight.  No chest pain or palpitations, no dizziness or syncope.  No recent hospitalization.   Past Medical History:  Diagnosis Date   Abnormal nuclear stress test 02/24/2010   Nuclear stress 02/24/10 Infero lateral ischemia     Allergic rhinitis, cause unspecified    skin test pos 02-23-2008   Angina pectoris (HCC)    CAD (coronary artery disease)    CKD (chronic kidney disease) stage 3, GFR 30-59 ml/min (HCC) 07/23/2018   Cough    Dyspnea on exertion 07/23/2018   GERD (gastroesophageal reflux disease)    Hypercholesteremia 07/23/2018   Ischemia    Kidney stone    Migraine    Moderate obesity 07/23/2018   Nephrolithiasis    Resistant hypertension 07/23/2018   TIA (transient ischemic attack)    Unspecified essential hypertension    Past Surgical History:  Procedure Laterality Date   bilateral tkr      EXTRACORPOREAL SHOCK WAVE LITHOTRIPSY Right 03/02/2019   Procedure: EXTRACORPOREAL SHOCK WAVE LITHOTRIPSY (ESWL);  Surgeon: BLucas Mallow MD;  Location: [facility];  Service: Urology;  Laterality: Right;   HERNIA REPAIR     reset nasal fracture     TONSILLECTOMY     Family History  Problem Relation Age of Onset   Heart attack Father    Stroke Father    Allergy (severe) Mother    Tuberculosis Other        grandmother    Social History   Tobacco Use   Smoking status: Never   Smokeless tobacco: Never  Substance Use Topics   Alcohol use: No    Alcohol/week: 0.0 standard drinks    Comment: Pt used to have alcohol problems.   Marital Status: Married  ROS  Review of Systems  Cardiovascular:  Positive for dyspnea on exertion. Negative for chest pain, leg swelling and orthopnea.  Musculoskeletal:  Positive for arthritis and back pain.  Gastrointestinal:  Negative for melena.  Genitourinary:  Positive for frequency.  Neurological:  Positive for vertigo (chronic).   Objective  Blood pressure (!) 202/113, pulse 67, height 5' 8"  (1.727 m), weight 220 lb (99.8 kg), SpO2 99 %.  Vitals with BMI 11/22/2020 11/22/2020 03/29/2020  Height - 5' 8"  5' 8"   Weight - 220 lbs 222 lbs  BMI - 393.81382.99 Systolic [id number] Diastolic [id number] Pulse 67 65  66     Physical Exam Constitutional:      Appearance: He is obese.  HENT:     Head: Atraumatic.  Neck:     Vascular: No carotid bruit or JVD.  Cardiovascular:     Rate and Rhythm: Normal rate and regular rhythm.     Pulses: Normal pulses and intact distal pulses.     Heart sounds: S1 normal and S2 normal. Murmur heard.  Harsh midsystolic murmur is present with a grade of 2/6 at the upper right sternal border.    No gallop.  Pulmonary:     Effort: Pulmonary effort is normal.     Breath sounds: Normal breath sounds.  Abdominal:     General: Bowel sounds are normal.     Palpations: Abdomen is soft.  Musculoskeletal:         General: Swelling (Trace bilateral ankle edema) present.  Skin:    General: Skin is warm.     Capillary Refill: Capillary refill takes less than 2 seconds.   Laboratory examination:   Recent Labs    04/18/20 1438  NA 142  K 4.1  CL 100  CO2 26  GLUCOSE 99  BUN 25  CREATININE 1.52*  CALCIUM 9.7  GFRNONAA 43*  GFRAA 49*   CrCl cannot be calculated (Patient's most recent lab result is older than the maximum 21 days allowed.).  CMP Latest Ref Rng & Units 04/18/2020  Glucose 65 - 99 mg/dL 99  BUN 8 - 27 mg/dL 25  Creatinine 0.76 - 1.27 mg/dL 1.52(H)  Sodium 134 - 144 mmol/L 142  Potassium 3.5 - 5.2 mmol/L 4.1  Chloride 96 - 106 mmol/L 100  CO2 20 - 29 mmol/L 26  Calcium 8.6 - 10.2 mg/dL 9.7   CBC Latest Ref Rng & Units 01/23/2008  WBC 4.5 - 10.5 10*3/microliter 14.8(H)  Hemoglobin 13.0 - 17.0 g/dL 16.9  Hematocrit 39.0 - 52.0 % 48.5  Platelets 150 - 400 K/uL 192   Lipid Panel No results for input(s): CHOL, TRIG, LDLCALC, VLDL, HDL, CHOLHDL, LDLDIRECT in the last 8760 hours.  HEMOGLOBIN A1C No results found for: HGBA1C, MPG TSH No results for input(s): TSH in the last 8760 hours.  External labs:   Labs 03/03/2020:  Hb 15.0/HCT 46.4, platelets 155, normal indicis.  Sodium 146, serum glucose 94 mg, potassium 4.7, BUN 13.7/creatinine 1.6, EGFR 44 mL.  CMP otherwise normal.  A1c 5.6%.  TSH normal at 3.13.  Total cholesterol 168, triglycerides 142, HDL 38, LDL 102.  Non-HDL cholesterol 130.  Urine analysis normal.  TSH 3.130 09/01/2019   LDL-C 124.000 09/01/2019 Triglycerides 104.000 09/01/2019  Medications and allergies   Allergies  Allergen Reactions   Amlodipine Swelling   Labetalol Swelling    Swollen lips    Lisinopril Cough   Penicillins Rash   Procaine Hcl     REACTION: increased bp   Prednisone Rash     Outpatient Medications Prior to Visit  Medication Sig Dispense Refill   acebutolol (SECTRAL) 400 MG capsule TAKE 1 CAPSULE(400 MG) BY MOUTH  DAILY (Patient taking differently: Taking in the morning) 90 capsule 2   hydrochlorothiazide (MICROZIDE) 12.5 MG capsule TAKE 1 CAPSULE(12.5 MG) BY MOUTH DAILY 30 capsule 3   HYDROcodone-acetaminophen (NORCO) 10-325 MG tablet hydrocodone 10 mg-acetaminophen 325 mg tablet  TAKE 1 TABLET BY MOUTH EVERY 6 HOURS AS NEEDED     olmesartan (BENICAR) 20 MG tablet Take 1 tablet (20 mg total) by mouth daily. (Patient taking differently: Take 20  mg by mouth daily. Taking in the evening) 14 tablet 0   pantoprazole (PROTONIX) 40 MG tablet Take 40 mg by mouth daily.     tamsulosin (FLOMAX) 0.4 MG CAPS capsule Take 0.4 mg by mouth.     hydrALAZINE (APRESOLINE) 25 MG tablet TAKE 1 TABLET(25 MG) BY MOUTH THREE TIMES DAILY 90 tablet 2   ipratropium (ATROVENT) 0.06 % nasal spray      isosorbide dinitrate (ISORDIL) 30 MG tablet Take 30 mg by mouth. Pt taking 1 tablet daily in the evening     HYDROcodone-acetaminophen (NORCO/VICODIN) 5-325 MG tablet Take 1 tablet by mouth every 6 (six) hours as needed for moderate pain. 10 tablet 0   methocarbamol (ROBAXIN) 500 MG tablet methocarbamol 500 mg tablet  TAKE 1 TABLET BY MOUTH AT BEDTIME AS NEEDED FOR PAIN     No facility-administered medications prior to visit.   Radiology:   No results found.  Cardiac Studies:   Lexiscan myoview stress test 08/25/2018:  1. Lexiscan stress test with low level exercise was performed. Stress symptoms included chest pain, relieved with rest. Resting blood pressure was 150/98 mmHg and peak effect blood pressure was 148/90 mmHg. The resting electrocardiogram demonstrated normal sinus rhythm, normal resting conduction, low voltage, possible old anteroseptal infarct, no resting arrhythmias and normal rest repolarization.  The stress electrocardiogram with low level exercise and lexiscan showed sinus rhythm, normal stress conduction, frequent PVC's and nonspecific ST-T changes.  Stress EKG is non diagnostic for ischemia as it is a  pharmacologic stress.  2. The overall quality of the study is fair.  Left ventricular cavity is noted to be enlarged on the rest and stress studies.  Gated SPECT images reveal mild global decrease in myocardial thickening and wall motion.  The left ventricular ejection fraction was calculated or visually estimated to be 43%.  SPECT images reveal medium sized, moderate intensity perfusion defect seen worse on rest images. While this may be due to attenuation artifact, ischemia in this region cannot be excluded.  3. Intermediate risk study.  4. Compared to 02/24/2010, no significant change.   Echocardiogram 02/01/2020: Left ventricle cavity is normal in size. Moderate concentric hypertrophy of the left ventricle. Moderate global hypokinesis. LVEF 35-40%. Normal global wall motion. Doppler evidence of grade I (impaired) diastolic dysfunction, normal LAP.  Left atrial cavity is severely dilated. 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 Trace aortic stenosis. Mild (Grade I) mitral regurgitation. Mild tricuspid regurgitation. Estimated pulmonary artery systolic pressure 29 mmHg. No significant change compared to previous study on 09/02/2018.  EKG  EKG 11/22/2020: Normal sinus rhythm at rate of 69 bpm, left axis deviation, left intrafascicular block.  Anteroseptal infarct old.  Low-voltage complexes.  Nonspecific T abnormality.  No significant change from 01/12/2020.  EKG 07/23/2018: Sinus bradycardia at rate of 60 bpm with first-degree AV block.  Normal axis.  Poor R-wave progression, cannot exclude anteroseptal infarct old.  Nonspecific T abnormality.  Assessment     ICD-10-CM   1. Hypertensive heart disease with heart failure (HCC)  I11.0 EKG 12-Lead    MR ANGIO ABDOMEN WO CONTRAST    2. Non-ischemic cardiomyopathy (HCC)  I42.8     3. Stage 3a chronic kidney disease (HCC)  N18.31     4. Coronary artery disease of native artery of native heart with stable angina pectoris ([place])   I25.118     5. Resistant hypertension  I10 hydrALAZINE (APRESOLINE) 25 MG tablet    MR ANGIO ABDOMEN WO CONTRAST  Medications Discontinued During This Encounter  Medication Reason   methocarbamol (ROBAXIN) 500 MG tablet    HYDROcodone-acetaminophen (NORCO/VICODIN) 5-325 MG tablet    ipratropium (ATROVENT) 0.06 % nasal spray Error   hydrALAZINE (APRESOLINE) 25 MG tablet     Meds ordered this encounter  Medications   hydrALAZINE (APRESOLINE) 25 MG tablet    Sig: Take 4 tablets (100 mg total) by mouth 3 (three) times daily.    Dispense:  270 tablet    Refill:  1    Recommendations:   QUANTEZ SCHNYDER is a 80 y.o.  Caucasian male with history of hypertension, gout, renal stones, hyperlipidemia. He has had difficulty in BP control.  I suspect nonischemic cardiomyopathy in view of global hypokinesis and uncontrolled hypertension and nuclear stress test revealing no focal abnormality.  He is enrolled in the patient in Remote Patient Monitoring and Principal Care Management as patient is high risk for hospitalization and complications from underlying medical conditions.   This is 28-monthoffice visit, his blood pressure is markedly elevated.  I would like to obtain MRA of the abdomen without contrast to evaluate for renal artery stenosis.  We will increase hydralazine from 25 mg 3 times daily to 100 mg 3 times daily.  He needs follow-up on his chronic renal failure.  Patient states that he has had recent lab works performed by Dr. CTheda Sersand we will try to obtain these.  With regard to chronic systolic and diastolic heart failure, symptoms of leg edema and dyspnea has remained stable.  I will screen him into  CARDINAL-HF (PDE 5 inhibitor in subjects with chronic systolic and diastolic heart failure).  he will continue remote transmission of his BP recordings and will adjust the medications as tolerated.    JAdrian Prows MD, FHigh [facility] Endoscopy7/09/2020, 4:57 PM Office: [phone]

## 2020-11-22 NOTE — Addendum Note (Signed)
Addended by: Cassell Clement T on: 11/22/2020 03:41 PM   Modules accepted: Orders

## 2020-11-23 NOTE — Progress Notes (Signed)
Labs 06/15/2020:  Hb 15.7/HCT 47.8, platelets 158, normal indicis.  Sodium 140, potassium 4.0, BUN 24, creatinine 1.7, EGFR 41 mL, CMP otherwise normal.  A1c 5.7%.  Total cholesterol 168, triglycerides 142, HDL 38, LDL 102.  Non-HDL cholesterol 130.  Will discuss during next OV

## 2020-12-11 ENCOUNTER — Ambulatory Visit
Admission: RE | Admit: 2020-12-11 | Discharge: 2020-12-11 | Disposition: A | Discharge status: Home / Self Care | Payer: Medicare Other | Source: Ambulatory Visit | Attending: Cardiology | Admitting: Cardiology

## 2020-12-11 ENCOUNTER — Other Ambulatory Visit: Payer: Self-pay

## 2020-12-11 DIAGNOSIS — I110 Hypertensive heart disease with heart failure: Secondary | ICD-10-CM

## 2020-12-11 DIAGNOSIS — I10 Essential (primary) hypertension: Secondary | ICD-10-CM

## 2020-12-14 DIAGNOSIS — I5030 Unspecified diastolic (congestive) heart failure: Secondary | ICD-10-CM | POA: Diagnosis not present

## 2020-12-14 DIAGNOSIS — I129 Hypertensive chronic kidney disease with stage 1 through stage 4 chronic kidney disease, or unspecified chronic kidney disease: Secondary | ICD-10-CM | POA: Diagnosis not present

## 2020-12-16 ENCOUNTER — Telehealth: Payer: Self-pay | Admitting: Cardiology

## 2020-12-16 NOTE — Telephone Encounter (Signed)
Sent by the front  Note  ''7/29 PT says he canceled his MRI appt b/c the MRI machine makes him feel claustrophobic. PT wants to discuss w/JG prior to his next appt on 8/16. [facility]"

## 2020-12-16 NOTE — Telephone Encounter (Signed)
7/29 PT says he canceled his MRI appt b/c the MRI machine makes him feel claustrophobic. PT wants to discuss w/JG prior to his next appt on 8/16. [facility]

## 2020-12-17 NOTE — Telephone Encounter (Signed)
MRI ordered for renal artery stenosis, will discuss with patient soon

## 2020-12-22 DIAGNOSIS — M5136 Other intervertebral disc degeneration, lumbar region: Secondary | ICD-10-CM | POA: Diagnosis not present

## 2020-12-22 DIAGNOSIS — E7800 Pure hypercholesterolemia, unspecified: Secondary | ICD-10-CM | POA: Diagnosis not present

## 2020-12-22 DIAGNOSIS — R7303 Prediabetes: Secondary | ICD-10-CM | POA: Diagnosis not present

## 2020-12-22 DIAGNOSIS — I129 Hypertensive chronic kidney disease with stage 1 through stage 4 chronic kidney disease, or unspecified chronic kidney disease: Secondary | ICD-10-CM | POA: Diagnosis not present

## 2020-12-22 DIAGNOSIS — I5030 Unspecified diastolic (congestive) heart failure: Secondary | ICD-10-CM | POA: Diagnosis not present

## 2020-12-22 DIAGNOSIS — Z5320 Procedure and treatment not carried out because of patient's decision for unspecified reasons: Secondary | ICD-10-CM | POA: Diagnosis not present

## 2020-12-22 DIAGNOSIS — I2510 Atherosclerotic heart disease of native coronary artery without angina pectoris: Secondary | ICD-10-CM | POA: Diagnosis not present

## 2020-12-22 DIAGNOSIS — N1831 Chronic kidney disease, stage 3a: Secondary | ICD-10-CM | POA: Diagnosis not present

## 2020-12-22 DIAGNOSIS — Z0000 Encounter for general adult medical examination without abnormal findings: Secondary | ICD-10-CM | POA: Diagnosis not present

## 2020-12-22 DIAGNOSIS — M1990 Unspecified osteoarthritis, unspecified site: Secondary | ICD-10-CM | POA: Diagnosis not present

## 2020-12-23 DIAGNOSIS — I10 Essential (primary) hypertension: Secondary | ICD-10-CM | POA: Diagnosis not present

## 2021-01-03 ENCOUNTER — Encounter: Payer: Self-pay | Admitting: Cardiology

## 2021-01-03 ENCOUNTER — Other Ambulatory Visit: Payer: Self-pay

## 2021-01-03 ENCOUNTER — Ambulatory Visit: Payer: Medicare Other | Admitting: Cardiology

## 2021-01-03 VITALS — BP 142/73 | HR 69 | Temp 98.3°F | Resp 17 | Ht 68.0 in | Wt 218.2 lb

## 2021-01-03 DIAGNOSIS — E7800 Pure hypercholesterolemia, unspecified: Secondary | ICD-10-CM | POA: Diagnosis not present

## 2021-01-03 DIAGNOSIS — I428 Other cardiomyopathies: Secondary | ICD-10-CM | POA: Diagnosis not present

## 2021-01-03 DIAGNOSIS — I10 Essential (primary) hypertension: Secondary | ICD-10-CM

## 2021-01-03 DIAGNOSIS — I5042 Chronic combined systolic (congestive) and diastolic (congestive) heart failure: Secondary | ICD-10-CM

## 2021-01-03 DIAGNOSIS — I25118 Atherosclerotic heart disease of native coronary artery with other forms of angina pectoris: Secondary | ICD-10-CM | POA: Diagnosis not present

## 2021-01-03 DIAGNOSIS — N1831 Chronic kidney disease, stage 3a: Secondary | ICD-10-CM

## 2021-01-03 MED ORDER — ATORVASTATIN CALCIUM 10 MG PO TABS
10.0000 mg | ORAL_TABLET | Freq: Every day | ORAL | 3 refills | Status: DC
Start: 2021-01-03 — End: 2022-02-28

## 2021-01-03 NOTE — Progress Notes (Signed)
Primary Physician/Referring:  Janie Morning, DO  Patient ID: Keith [name], male    DOB: December 06, 1940, 80 y.o.   MRN: [id number]  Chief Complaint  Patient presents with   Congestive Heart Failure   Hypertension   Follow-up    6 weeks   HPI:    Keith [name]  is a 80 y.o. Caucasian male with history of hypertension, gout, renal stones, hyperlipidemia. He has had difficulty in BP control.  I suspect nonischemic cardiomyopathy in view of global hypokinesis and uncontrolled hypertension and nuclear stress test revealing no focal abnormality.  He is enrolled in the Remote Patient Monitoring and Principal Care Management as patient is high risk for hospitalization and complications from underlying medical conditions.   This is a 12-monthoffice visit. He has had difficulty in BP control.  He is presently enrolled in PCM & RPM.  His most recent home recordings of blood pressure show an overall decrease in his blood pressure since being started on Hydralazine 100 mg three times per day. The patient does report that the occasionally forgets to take his afternoon dose. He is taking all of his other BP medications as prescribed. His one month average BP based on monitoring is 146/90.   The patient was scheduled for an MRA of the abdomen to assess for renal artery stenosis as a potential cause of the patient's poor BP control. The patient states that he was unable to complete the test due to claustrophobia.   No chest pain or palpitations, no dizziness or syncope.  No recent hospitalization.  The patient does endorse recent weight loss due to only eating 2 meals per day.   Past Medical History:  Diagnosis Date   Abnormal nuclear stress test 02/24/2010   Nuclear stress 02/24/10 Infero lateral ischemia     Allergic rhinitis, cause unspecified    skin test pos 02-23-2008   Angina pectoris (HCC)    CAD (coronary artery disease)    CKD (chronic kidney disease) stage 3, GFR 30-59 ml/min (HCC) 07/23/2018    Cough    Dyspnea on exertion 07/23/2018   GERD (gastroesophageal reflux disease)    Hypercholesteremia 07/23/2018   Ischemia    Kidney stone    Migraine    Moderate obesity 07/23/2018   Nephrolithiasis    Resistant hypertension 07/23/2018   TIA (transient ischemic attack)    Unspecified essential hypertension    Past Surgical History:  Procedure Laterality Date   bilateral tkr     EXTRACORPOREAL SHOCK WAVE LITHOTRIPSY Right 03/02/2019   Procedure: EXTRACORPOREAL SHOCK WAVE LITHOTRIPSY (ESWL);  Surgeon: BLucas Mallow MD;  Location: [facility];  Service: Urology;  Laterality: Right;   HERNIA REPAIR     reset nasal fracture     TONSILLECTOMY     Family History  Problem Relation Age of Onset   Allergy (severe) Mother    Heart attack Father    Stroke Father    COPD Brother    Heart attack Brother    Tuberculosis Other        grandmother    Social History   Tobacco Use   Smoking status: Never   Smokeless tobacco: Never  Substance Use Topics   Alcohol use: No    Alcohol/week: 0.0 standard drinks    Comment: Pt used to have alcohol problems.   Marital Status: Married  ROS  Review of Systems  Cardiovascular:  Negative for chest pain, leg swelling and orthopnea.  Respiratory:  Negative for shortness  of breath.   Musculoskeletal:  Positive for arthritis and back pain.  Gastrointestinal:  Negative for melena.  Genitourinary:  Positive for frequency.  Neurological:  Positive for vertigo (chronic).   Objective  Blood pressure (!) 142/73, pulse 69, temperature 98.3 F (36.8 C), temperature source Temporal, resp. rate 17, height 5' 8"  (1.727 m), weight 99 kg, SpO2 95 %.  Vitals with BMI 01/03/2021 01/03/2021 11/22/2020  Height - 5' 8"  -  Weight - 218 lbs 3 oz -  BMI - 65.78 -  Systolic 469 629 528  Diastolic 73 76 413  Pulse 69 69 67     Physical Exam Constitutional:      Appearance: He is obese.  HENT:     Head: Atraumatic.  Neck:     Vascular: No carotid bruit or JVD.   Cardiovascular:     Rate and Rhythm: Normal rate and regular rhythm.     Pulses: Normal pulses and intact distal pulses.     Heart sounds: S1 normal and S2 normal. Murmur heard.  Harsh midsystolic murmur is present with a grade of 2/6 at the upper right sternal border.    No gallop.  Pulmonary:     Effort: Pulmonary effort is normal.     Breath sounds: Normal breath sounds.  Abdominal:     General: Bowel sounds are normal.     Palpations: Abdomen is soft.  Musculoskeletal:        General: Swelling: Trace bilateral ankle edema.     Right lower leg: No edema.     Left lower leg: No edema.  Skin:    General: Skin is warm.     Capillary Refill: Capillary refill takes less than 2 seconds.   Laboratory examination:   Recent Labs    04/18/20 1438  NA 142  K 4.1  CL 100  CO2 26  GLUCOSE 99  BUN 25  CREATININE 1.52*  CALCIUM 9.7  GFRNONAA 43*  GFRAA 49*   CrCl cannot be calculated (Patient's most recent lab result is older than the maximum 21 days allowed.).  CMP Latest Ref Rng & Units 04/18/2020  Glucose 65 - 99 mg/dL 99  BUN 8 - 27 mg/dL 25  Creatinine 0.76 - 1.27 mg/dL 1.52(H)  Sodium 134 - 144 mmol/L 142  Potassium 3.5 - 5.2 mmol/L 4.1  Chloride 96 - 106 mmol/L 100  CO2 20 - 29 mmol/L 26  Calcium 8.6 - 10.2 mg/dL 9.7   CBC Latest Ref Rng & Units 01/23/2008  WBC 4.5 - 10.5 10*3/microliter 14.8(H)  Hemoglobin 13.0 - 17.0 g/dL 16.9  Hematocrit 39.0 - 52.0 % 48.5  Platelets 150 - 400 K/uL 192   Lipid Panel No results for input(s): CHOL, TRIG, LDLCALC, VLDL, HDL, CHOLHDL, LDLDIRECT in the last 8760 hours.  HEMOGLOBIN A1C No results found for: HGBA1C, MPG TSH No results for input(s): TSH in the last 8760 hours.  External labs:   Labs 03/03/2020:  Hb 15.0/HCT 46.4, platelets 155, normal indicis.  Sodium 146, serum glucose 94 mg, potassium 4.7, BUN 13.7/creatinine 1.6, EGFR 44 mL.  CMP otherwise normal.  A1c 5.6%.  TSH normal at 3.13.  Total cholesterol 168,  triglycerides 142, HDL 38, LDL 102.  Non-HDL cholesterol 130.  Urine analysis normal.  TSH 3.130 09/01/2019   LDL-C 124.000 09/01/2019 Triglycerides 104.000 09/01/2019  Labs on 06/15/2020: CBC: Hemoglobin 15.7, Hematocrit 47.8, WBC 10.3, Platelet 158, MCV 88.5 CMP: Sodium 140, Potassium 4.0, Glucose 106, BUN 24, Creatinine 1.7, eGFR 41.31, Alk  Phos 60, AST 13, ALT 15 Lipids: LDL 102, HDL 38, Triglycerides 142, Total Cholesterol 168 Hemoglobin A1C: 5.7 TSH 6.17  Labs on 12/14/2020 CBC: Hemoglobin 15.4, Hematocrit 47.0, WBC 9.9, Platelet 160, MCV 89.2 CMP: Sodium 142, Potassium 4.1, Glucose 106, BUN 20, Creatinine 1.48 , eGFR 44, Alk Phos 64, AST 16, ALT 11 Lipids: LDL 116, HDL 38, Triglycerides 180, Total Cholesterol 190   Medications and allergies   Allergies  Allergen Reactions   Amlodipine Swelling   Labetalol Swelling    Swollen lips    Lisinopril Cough   Penicillins Rash   Allopurinol     Other reaction(s): rash   Amoxicillin-Pot Clavulanate     Other reaction(s): swelling of the neck ad throat   Azithromycin     Other reaction(s): Unknown   Benzonatate     Other reaction(s): Unknown   Cefuroxime     Other reaction(s): facial swelling, rash   Codeine     Other reaction(s): itching, rash   Epinephrine     Other reaction(s): tachycardia   Procaine Hcl     REACTION: increased bp   Valsartan     Other reaction(s): cough   Prednisone Rash     Outpatient Medications Prior to Visit  Medication Sig Dispense Refill   acebutolol (SECTRAL) 400 MG capsule TAKE 1 CAPSULE(400 MG) BY MOUTH DAILY (Patient taking differently: Take 400 mg by mouth daily. Taking in the morning) 90 capsule 2   hydrALAZINE (APRESOLINE) 25 MG tablet TAKE 4 TABLETS(100 MG) BY MOUTH THREE TIMES DAILY 90 tablet 0   hydrochlorothiazide (MICROZIDE) 12.5 MG capsule TAKE 1 CAPSULE(12.5 MG) BY MOUTH DAILY 30 capsule 3   HYDROcodone-acetaminophen (NORCO) 10-325 MG tablet Take 1 tablet by mouth every 6  (six) hours as needed.     olmesartan (BENICAR) 20 MG tablet Take 1 tablet (20 mg total) by mouth daily. (Patient taking differently: Take 20 mg by mouth daily. Taking in the evening) 14 tablet 0   pantoprazole (PROTONIX) 40 MG tablet Take 40 mg by mouth daily.     tamsulosin (FLOMAX) 0.4 MG CAPS capsule Take 0.4 mg by mouth.     HYDROcodone-acetaminophen (NORCO) 10-325 MG tablet hydrocodone 10 mg-acetaminophen 325 mg tablet  TAKE 1 TABLET BY MOUTH EVERY 6 HOURS AS NEEDED     isosorbide dinitrate (ISORDIL) 30 MG tablet Take 30 mg by mouth. Pt taking 1 tablet daily in the evening     pantoprazole (PROTONIX) 40 MG tablet Take 1 tablet by mouth daily.     No facility-administered medications prior to visit.   Radiology:   No results found.  Cardiac Studies:   Lexiscan myoview stress test 08/25/2018:  1. Lexiscan stress test with low level exercise was performed. Stress symptoms included chest pain, relieved with rest. Resting blood pressure was 150/98 mmHg and peak effect blood pressure was 148/90 mmHg. The resting electrocardiogram demonstrated normal sinus rhythm, normal resting conduction, low voltage, possible old anteroseptal infarct, no resting arrhythmias and normal rest repolarization.  The stress electrocardiogram with low level exercise and lexiscan showed sinus rhythm, normal stress conduction, frequent PVC's and nonspecific ST-T changes.  Stress EKG is non diagnostic for ischemia as it is a pharmacologic stress.  2. The overall quality of the study is fair.  Left ventricular cavity is noted to be enlarged on the rest and stress studies.  Gated SPECT images reveal mild global decrease in myocardial thickening and wall motion.  The left ventricular ejection fraction was calculated or visually estimated to  be 43%.  SPECT images reveal medium sized, moderate intensity perfusion defect seen worse on rest images. While this may be due to attenuation artifact, ischemia in this region cannot be  excluded.  3. Intermediate risk study.  4. Compared to 02/24/2010, no significant change.   Echocardiogram 02/01/2020: Left ventricle cavity is normal in size. Moderate concentric hypertrophy of the left ventricle. Moderate global hypokinesis. LVEF 35-40%. Normal global wall motion. Doppler evidence of grade I (impaired) diastolic dysfunction, normal LAP.  Left atrial cavity is severely dilated. 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 Trace aortic stenosis. Mild (Grade I) mitral regurgitation. Mild tricuspid regurgitation. Estimated pulmonary artery systolic pressure 29 mmHg. No significant change compared to previous study on 09/02/2018.  EKG  EKG 11/22/2020: Normal sinus rhythm at rate of 69 bpm, left axis deviation, left intrafascicular block.  Anteroseptal infarct old.  Low-voltage complexes.  Nonspecific T abnormality.  No significant change from 01/12/2020.  EKG 07/23/2018: Sinus bradycardia at rate of 60 bpm with first-degree AV block.  Normal axis.  Poor R-wave progression, cannot exclude anteroseptal infarct old.  Nonspecific T abnormality.  Assessment     ICD-10-CM   1. Resistant hypertension  I10     2. Stage 3a chronic kidney disease (HCC)  N18.31     3. Chronic combined systolic and diastolic heart failure (HCC)  I50.42 PCV ECHOCARDIOGRAM COMPLETE    4. Non-ischemic cardiomyopathy ([place])  I42.8     5. Hypercholesteremia  E78.00 atorvastatin (LIPITOR) 10 MG tablet      Medications Discontinued During This Encounter  Medication Reason   HYDROcodone-acetaminophen (NORCO) 10-325 MG tablet Error   isosorbide dinitrate (ISORDIL) 30 MG tablet Error   pantoprazole (PROTONIX) 40 MG tablet Error    Meds ordered this encounter  Medications   atorvastatin (LIPITOR) 10 MG tablet    Sig: Take 1 tablet (10 mg total) by mouth daily.    Dispense:  90 tablet    Refill:  3     Recommendations:   Keith [name] is a 80 y.o.  Caucasian male with history of  hypertension, gout, renal stones, hyperlipidemia. He has had difficulty in BP control.  I suspect nonischemic cardiomyopathy in view of global hypokinesis and uncontrolled hypertension and nuclear stress test revealing no focal abnormality.  He is enrolled in the patient in Remote Patient Monitoring and Principal Care Management as patient is high risk for hospitalization and complications from underlying medical conditions.   The patient presents today with more optimal BP control since the addition of Hydralazine 100 mg 3 times daily. The patient does occasionally forget his afternoon dose of hydralazine. The patient was encouraged to remember his afternoon dose. The patient was also encouraged to lose 10 pounds. With the implementation of these changes, the patient was advised that he would likely experience even better BP control. Will continue to monitor.   The patient's weight is likely the key factor in his HTN. Due to this, will hold off on obtaining the abdominal MRA without contrast since the patient was unable to tolerate the test at this time. Other diagnostic tests requiring contrast to evaluate the renal arteries cannot be performed at this time due to the patient's decreased renal function.   Analysis of the patient's medications reveals that the patient is not on a statin despite his risk factors and increase in LDL to 116 as trended in "External Labs" above. The patient has been on statins in the past and denies myalgias while on them. Will  start the patient on 10 mg Atorvastatin and continue to monitor.   With regard to chronic systolic and diastolic heart failure, symptoms of leg edema and dyspnea has remained stable.  Repeat echocardiogram has been ordered for continued monitoring.  I will screen him into  CARDINAL-HF (PDE 5 inhibitor in subjects with chronic systolic and diastolic heart failure).  He will continue remote transmission of his BP recordings and these will be reevaluated  at his next office visit.   Follow up in 6 months for hypertension and chronic diastolic heart failure.    Herrick, PA-S 01/03/2021, 5:32 PM Office: [phone]

## 2021-01-18 ENCOUNTER — Other Ambulatory Visit: Payer: Self-pay | Admitting: Cardiology

## 2021-01-18 DIAGNOSIS — I10 Essential (primary) hypertension: Secondary | ICD-10-CM

## 2021-02-09 ENCOUNTER — Other Ambulatory Visit: Payer: Self-pay | Admitting: Cardiology

## 2021-02-09 DIAGNOSIS — I10 Essential (primary) hypertension: Secondary | ICD-10-CM

## 2021-02-10 ENCOUNTER — Other Ambulatory Visit: Payer: Self-pay

## 2021-02-10 ENCOUNTER — Other Ambulatory Visit: Payer: Self-pay | Admitting: Cardiology

## 2021-02-10 ENCOUNTER — Telehealth: Payer: Self-pay | Admitting: Cardiology

## 2021-02-10 DIAGNOSIS — I10 Essential (primary) hypertension: Secondary | ICD-10-CM

## 2021-02-10 MED ORDER — HYDRALAZINE HCL 100 MG PO TABS: 100.0000 mg | ORAL_TABLET | Freq: Four times a day (QID) | ORAL | 3 refills | Status: DC

## 2021-02-10 NOTE — Telephone Encounter (Signed)
Lane w/[facility] wants to verify hydralazine 4 tablets by mouth, 3x per day. Says the patient may run out quickly taking this amount, and wanted to know if it should be placed as a 90 day supply or if there should be any adjustments to prescription. Phone # [phone].

## 2021-02-13 DIAGNOSIS — Z23 Encounter for immunization: Secondary | ICD-10-CM | POA: Diagnosis not present

## 2021-02-13 NOTE — Telephone Encounter (Signed)
This was fixed

## 2021-02-22 DIAGNOSIS — I10 Essential (primary) hypertension: Secondary | ICD-10-CM | POA: Diagnosis not present

## 2021-03-20 ENCOUNTER — Other Ambulatory Visit: Payer: Self-pay | Admitting: Cardiology

## 2021-03-20 DIAGNOSIS — I10 Essential (primary) hypertension: Secondary | ICD-10-CM

## 2021-03-28 DIAGNOSIS — R2689 Other abnormalities of gait and mobility: Secondary | ICD-10-CM | POA: Diagnosis not present

## 2021-03-28 DIAGNOSIS — H8112 Benign paroxysmal vertigo, left ear: Secondary | ICD-10-CM | POA: Diagnosis not present

## 2021-03-29 DIAGNOSIS — Z20822 Contact with and (suspected) exposure to covid-19: Secondary | ICD-10-CM | POA: Diagnosis not present

## 2021-04-01 DIAGNOSIS — J029 Acute pharyngitis, unspecified: Secondary | ICD-10-CM | POA: Diagnosis not present

## 2021-04-01 DIAGNOSIS — H6690 Otitis media, unspecified, unspecified ear: Secondary | ICD-10-CM | POA: Diagnosis not present

## 2021-04-25 DIAGNOSIS — Z23 Encounter for immunization: Secondary | ICD-10-CM | POA: Diagnosis not present

## 2021-05-02 DIAGNOSIS — J343 Hypertrophy of nasal turbinates: Secondary | ICD-10-CM | POA: Diagnosis not present

## 2021-05-02 DIAGNOSIS — R490 Dysphonia: Secondary | ICD-10-CM | POA: Diagnosis not present

## 2021-05-02 DIAGNOSIS — R0982 Postnasal drip: Secondary | ICD-10-CM | POA: Diagnosis not present

## 2021-05-02 DIAGNOSIS — J310 Chronic rhinitis: Secondary | ICD-10-CM | POA: Diagnosis not present

## 2021-05-02 DIAGNOSIS — K219 Gastro-esophageal reflux disease without esophagitis: Secondary | ICD-10-CM | POA: Diagnosis not present

## 2021-05-18 ENCOUNTER — Encounter: Payer: Self-pay | Admitting: Physical Therapy

## 2021-05-18 ENCOUNTER — Ambulatory Visit: Payer: Medicare Other | Attending: Otolaryngology | Admitting: Physical Therapy

## 2021-05-18 ENCOUNTER — Other Ambulatory Visit: Payer: Self-pay

## 2021-05-18 DIAGNOSIS — R42 Dizziness and giddiness: Secondary | ICD-10-CM | POA: Diagnosis not present

## 2021-05-18 DIAGNOSIS — R2681 Unsteadiness on feet: Secondary | ICD-10-CM | POA: Insufficient documentation
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
## 2021-05-25 ENCOUNTER — Ambulatory Visit: Payer: Medicare Other | Admitting: Physical Therapy

## 2021-05-29 ENCOUNTER — Other Ambulatory Visit: Payer: Self-pay

## 2021-05-29 ENCOUNTER — Encounter: Payer: Self-pay | Admitting: Physical Therapy

## 2021-05-29 ENCOUNTER — Ambulatory Visit: Payer: Medicare Other | Attending: Otolaryngology | Admitting: Physical Therapy

## 2021-05-29 DIAGNOSIS — R42 Dizziness and giddiness: Secondary | ICD-10-CM | POA: Diagnosis not present

## 2021-05-29 DIAGNOSIS — R2681 Unsteadiness on feet: Secondary | ICD-10-CM | POA: Insufficient documentation

## 2021-05-30 NOTE — Therapy (Signed)
[facility] [address] [place], [place], [address] Phone: [phone]   Fax:  [phone]  Physical Therapy Treatment  Patient Details  Name: Keith [name] MRN: [id number] Date of Birth: 01/10/1941 Referring Provider (PT): Dr. Suszanne Conners   Encounter Date: 05/29/2021   PT End of Session - 05/29/21 1755     Visit Number 2    Number of Visits 9    Date for PT Re-Evaluation 06/15/21    Authorization Type Medicare and [employer]    Authorization Time Period 05/18/21 to 06/15/21    Progress Note Due on Visit 10    PT Start Time 1712    PT Stop Time 1755    PT Time Calculation (min) 43 min    Activity Tolerance Patient tolerated treatment well    Behavior During Therapy [facility] for tasks assessed/performed             Past Medical History:  Diagnosis Date   Abnormal nuclear stress test 02/24/2010   Nuclear stress 02/24/10 Infero lateral ischemia     Allergic rhinitis, cause unspecified    skin test pos 02-23-2008   Angina pectoris (HCC)    CAD (coronary artery disease)    CKD (chronic kidney disease) stage 3, GFR 30-59 ml/min (HCC) 07/23/2018   Cough    Dyspnea on exertion 07/23/2018   GERD (gastroesophageal reflux disease)    Hypercholesteremia 07/23/2018   Ischemia    Kidney stone    Migraine    Moderate obesity 07/23/2018   Nephrolithiasis    Resistant hypertension 07/23/2018   TIA (transient ischemic attack)    Unspecified essential hypertension     Past Surgical History:  Procedure Laterality Date   bilateral tkr     EXTRACORPOREAL SHOCK WAVE LITHOTRIPSY Right 03/02/2019   Procedure: EXTRACORPOREAL SHOCK WAVE LITHOTRIPSY (ESWL);  Surgeon: Crista Elliot, MD;  Location: [facility];  Service: Urology;  Laterality: Right;   HERNIA REPAIR     reset nasal fracture     TONSILLECTOMY      There were no vitals filed for this visit.   Subjective Assessment - 05/29/21 1753     Subjective Patient reports he felt terrible  yesterday, felt like he had water inside his eardrum, with occasional mild dizziness if he were lie on his L side. he took benedryl and felt much better. No dizziness today.    Patient Stated Goals get rid of dizziness, be able to dance again (limited by dizziness)    Currently in Pain? No/denies                                        PT Education - 05/29/21 1754     Education Details No symptoms elicited today with vestibular testing or occular testing.    Person(s) Educated Patient    Methods Explanation    Comprehension Verbalized understanding              PT Short Term Goals - 05/18/21 1608       PT SHORT TERM GOAL #1   Title Will be compliant with appropriate vestibular focused HEP    Time 2    Period Weeks    Status New    Target Date 06/01/21               PT Long Term Goals - 05/18/21 1608  PT LONG TERM GOAL #1   Title Dizziness to be no more than 2/10 at worst in order to improve functional task tolerance and improve QOL, show improvement of BPPV symptoms    Time 4    Period Weeks    Status New    Target Date 06/15/21      PT LONG TERM GOAL #2   Title Will score at least 48/56 on DGI in order to show reduced fall risk    Time 4    Period Weeks    Status New      PT LONG TERM GOAL #3   Title Will be independent with advanced HEP to include balance exercises    Time 4    Period Weeks    Status New                   Plan - 05/29/21 1848     Clinical Impression Statement Patient reports episode of dizziness yesterday and the feeling of water inside his ear. He took some Benedryl and it helped a lot. Therapist performed BERG assessment today-43/56. All vestibular assessment was negative for B sides. He continues to C/O L thigh pain. He has several Dr appointments upcoming including orthopedic on MOnday. Encouraged to ask for PT referral if neccessary for his leg. Will monitor his dizziness and determine if  he needs to return on Wed or hold the appointment if not having any more dizziness.    Personal Factors and Comorbidities Past/Current Experience;Comorbidity 3+;Education;Time since onset of injury/illness/exacerbation    Examination-Activity Limitations Locomotion Level;Transfers;Bed Mobility    Examination-Participation Restrictions Community Activity;Interpersonal Relationship;Medication Management;Yard Work    Merchant navy officer Evolving/Moderate complexity    Clinical Decision Making Moderate    Rehab Potential Good    PT Frequency 2x / week    PT Duration 4 weeks    PT Treatment/Interventions ADLs/Self Care Home Management;Canalith Repostioning;DME Instruction;Gait training;Stair training;Functional mobility training;Therapeutic activities;Therapeutic exercise;Balance training;Neuromuscular re-education;Patient/family education;Manual techniques;Energy conservation;Vestibular    PT Next Visit Plan how does he feel? L BPPV tx as indicated, needs to do Berg. Give HEP    PT Home Exercise Plan not given at eval    Consulted and Agree with Plan of Care Patient             Patient will benefit from skilled therapeutic intervention in order to improve the following deficits and impairments:  Dizziness, Decreased balance, Decreased strength, Decreased mobility  Visit Diagnosis: Dizziness and giddiness  Unsteadiness on feet     Problem List Patient Active Problem List   Diagnosis Date Noted   Resistant hypertension 07/23/2018   Dyspnea on exertion 07/23/2018   Moderate obesity 07/23/2018   CKD (chronic kidney disease) stage 3, GFR 30-59 ml/min (HCC) 07/23/2018   Obesity (BMI 30.0-34.9) 06/27/2018   Abnormal nuclear stress test 02/24/2010   GERD 09/13/2009   ALLERGIC RHINITIS 01/23/2008   Essential hypertension 07/22/2007   COUGH 07/22/2007    Marcelina Morel, DPT 05/30/2021, 8:24 AM  [place]. [place], [place], [address] Phone: [phone]   Fax:  [phone]  Name: Keith [name] MRN: [id number] Date of Birth: 01/28/1941

## 2021-05-31 ENCOUNTER — Ambulatory Visit: Payer: Medicare Other | Admitting: Physical Therapy

## 2021-05-31 ENCOUNTER — Other Ambulatory Visit: Payer: Self-pay

## 2021-05-31 ENCOUNTER — Encounter: Payer: Self-pay | Admitting: Physical Therapy

## 2021-05-31 DIAGNOSIS — R2681 Unsteadiness on feet: Secondary | ICD-10-CM

## 2021-05-31 DIAGNOSIS — R42 Dizziness and giddiness: Secondary | ICD-10-CM

## 2021-05-31 NOTE — Patient Instructions (Signed)
Access Code: [email or web address] URL: [email or web address] Date: 05/31/2021 Prepared by: Oley Balm  Exercises Supine Chin Tuck - 1 x daily - 7 x weekly - 3 sets - 10 hold Supine Cervical Rotation AROM on Pillow - 1 x daily - 7 x weekly - 3 sets - 10 hold Supine Cervical Sidebending - 1 x daily - 7 x weekly - 3 sets - 10 hold Seated Gentle Upper Trapezius Stretch - 1 x daily - 7 x weekly - 3 sets - 20 hold Gentle Levator Scapulae Stretch - 1 x daily - 7 x weekly - 3 sets - 20 hold Seated Scapular Retraction - 1 x daily - 7 x weekly - 3 sets - 10 reps

## 2021-05-31 NOTE — Therapy (Signed)
[place] [place], [place], [address] Phone: [phone]   Fax:  [phone]  Physical Therapy Treatment  Patient Details  Name: MAKOY WYER MRN: [id number] Date of Birth: 1940-11-17 Referring Provider (PT): Dr. Benjamine Mola   Encounter Date: 05/31/2021   PT End of Session - 05/31/21 1449     Visit Number 3    Number of Visits 9    Date for PT Re-Evaluation 06/15/21    Authorization Type Medicare and [place] Time Period 05/18/21 to 06/15/21    Progress Note Due on Visit 10    PT Start Time [id number]    PT Stop Time [id number]    PT Time Calculation (min) 40 min    Activity Tolerance Patient tolerated treatment well    Behavior During Therapy [facility] for tasks assessed/performed             Past Medical History:  Diagnosis Date   Abnormal nuclear stress test 02/24/2010   Nuclear stress 02/24/10 Infero lateral ischemia     Allergic rhinitis, cause unspecified    skin test pos 02-23-2008   Angina pectoris (HCC)    CAD (coronary artery disease)    CKD (chronic kidney disease) stage 3, GFR 30-59 ml/min ([place]) 07/23/2018   Cough    Dyspnea on exertion 07/23/2018   GERD (gastroesophageal reflux disease)    Hypercholesteremia 07/23/2018   Ischemia    Kidney stone    Migraine    Moderate obesity 07/23/2018   Nephrolithiasis    Resistant hypertension 07/23/2018   TIA (transient ischemic attack)    Unspecified essential hypertension     Past Surgical History:  Procedure Laterality Date   bilateral tkr     EXTRACORPOREAL SHOCK WAVE LITHOTRIPSY Right 03/02/2019   Procedure: EXTRACORPOREAL SHOCK WAVE LITHOTRIPSY (ESWL);  Surgeon: Lucas Mallow, MD;  Location: [facility];  Service: Urology;  Laterality: Right;   HERNIA REPAIR     reset nasal fracture     TONSILLECTOMY      There were no vitals filed for this visit.   Subjective Assessment - 05/31/21 1315     Subjective Patient reports no dizziness since  his last visit. He reports sinus pressure with pressure behind each ear.    Currently in Pain? No/denies                [facility] PT Assessment - 05/31/21 0001       ROM / Strength   AROM / PROM / Strength AROM      AROM   AROM Assessment Site Cervical    Cervical Flexion 60%    Cervical Extension 100%    Cervical - Right Side Bend 50%    Cervical - Left Side Bend 40%    Cervical - Right Rotation 50%    Cervical - Left Rotation 60%                 Vestibular Assessment - 05/31/21 0001       Oculomotor Exam   Oculomotor Alignment Abnormal   L eye alighned slightly medial to the R.   Ocular ROM WNL    Head shaking Horizontal Absent    Head Shaking Vertical Absent    Smooth Pursuits Intact    Saccades Intact      Vestibulo-Ocular Reflex   VOR 1 Head Only (x 1 viewing) WNL  [facility] Adult PT Treatment/Exercise - 05/31/21 0001       Exercises   Exercises Neck      Neck Exercises: Seated   Neck Retraction 5 reps;5 secs    Postural Training Seated scapular retraction with B hands on mat behind hips, 5 x 10 seconds      Neck Exercises: Stretches   Upper Trapezius Stretch Right;Left;2 reps;20 seconds    Levator Stretch Right;Left;2 reps;20 seconds    Neck Stretch 2 reps;20 seconds    Neck Stretch Limitations Side bend and rotation in each direction,      Manual Therapy   Manual Therapy Soft tissue mobilization;Passive ROM    Soft tissue mobilization B UP traps, LS, cervical muscles.    Passive ROM All cervical ROM                     PT Education - 05/31/21 1449     Education Details HEP    Person(s) Educated Patient    Methods Handout;Demonstration;Explanation    Comprehension Verbalized understanding;Returned demonstration              PT Short Term Goals - 05/31/21 1453       PT SHORT TERM GOAL #1   Title Will be compliant with appropriate vestibular focused HEP    Baseline program developing     Time 1    Period Weeks    Status New    Target Date 06/07/21               PT Long Term Goals - 05/18/21 1608       PT LONG TERM GOAL #1   Title Dizziness to be no more than 2/10 at worst in order to improve functional task tolerance and improve QOL, show improvement of BPPV symptoms    Time 4    Period Weeks    Status New    Target Date 06/15/21      PT LONG TERM GOAL #2   Title Will score at least 48/56 on DGI in order to show reduced fall risk    Time 4    Period Weeks    Status New      PT LONG TERM GOAL #3   Title Will be independent with advanced HEP to include balance exercises    Time 4    Period Weeks    Status New                   Plan - 05/31/21 1450     Clinical Impression Statement Patient reports no more dizzy episodes, but he reports pressure behind B ears. TTP in upper traps, at occiput. Therapist faciltiated STM and stretch to B up traps, LS, all cervical ROM. and F/U with HEp for patient to continue to stretch. He reports decreased tension after treatment.    Personal Factors and Comorbidities Past/Current Experience;Comorbidity 3+;Education;Time since onset of injury/illness/exacerbation    Examination-Activity Limitations Locomotion Level;Transfers;Bed Mobility    Examination-Participation Restrictions Community Activity;Interpersonal Relationship;Medication Management;Yard Work    Conservation officer, historic buildings Evolving/Moderate complexity    Rehab Potential Good    PT Frequency 2x / week    PT Duration 4 weeks    PT Treatment/Interventions ADLs/Self Care Home Management;Canalith Repostioning;DME Instruction;Gait training;Stair training;Functional mobility training;Therapeutic activities;Therapeutic exercise;Balance training;Neuromuscular re-education;Patient/family education;Manual techniques;Energy conservation;Vestibular    PT Next Visit Plan how does he feel? L BPPV tx as indicated, needs to do Berg. Give HEP    PT Home Exercise  Plan [email or web address]    Consulted and Agree with Plan of Care Patient             Patient will benefit from skilled therapeutic intervention in order to improve the following deficits and impairments:  Dizziness, Decreased balance, Decreased strength, Decreased mobility  Visit Diagnosis: Dizziness and giddiness  Unsteadiness on feet     Problem List Patient Active Problem List   Diagnosis Date Noted   Resistant hypertension 07/23/2018   Dyspnea on exertion 07/23/2018   Moderate obesity 07/23/2018   CKD (chronic kidney disease) stage 3, GFR 30-59 ml/min (HCC) 07/23/2018   Obesity (BMI 30.0-34.9) 06/27/2018   Abnormal nuclear stress test 02/24/2010   GERD 09/13/2009   ALLERGIC RHINITIS 01/23/2008   Essential hypertension 07/22/2007   COUGH 07/22/2007    Marcelina Morel, DPT 05/31/2021, 2:55 PM  [place] [place], [place], [address] Phone: [phone]   Fax:  [phone]  Name: NERI LOO MRN: [id number] Date of Birth: Dec 09, 1940

## 2021-06-05 DIAGNOSIS — M545 Low back pain, unspecified: Secondary | ICD-10-CM | POA: Diagnosis not present

## 2021-06-05 DIAGNOSIS — M25552 Pain in left hip: Secondary | ICD-10-CM | POA: Diagnosis not present

## 2021-06-06 ENCOUNTER — Ambulatory Visit: Payer: Medicare Other | Admitting: Physical Therapy

## 2021-06-08 ENCOUNTER — Ambulatory Visit: Payer: Medicare Other | Admitting: Physical Therapy

## 2021-06-09 ENCOUNTER — Ambulatory Visit: Payer: Medicare Other | Admitting: Physical Therapy

## 2021-06-12 DIAGNOSIS — M6281 Muscle weakness (generalized): Secondary | ICD-10-CM | POA: Diagnosis not present

## 2021-06-12 DIAGNOSIS — M5416 Radiculopathy, lumbar region: Secondary | ICD-10-CM | POA: Diagnosis not present

## 2021-06-12 DIAGNOSIS — M545 Low back pain, unspecified: Secondary | ICD-10-CM | POA: Diagnosis not present

## 2021-06-13 ENCOUNTER — Encounter: Payer: Self-pay | Admitting: Physical Therapy

## 2021-06-13 ENCOUNTER — Ambulatory Visit: Payer: Medicare Other | Admitting: Physical Therapy

## 2021-06-13 NOTE — Therapy (Signed)
[place] [facility] °[address] °[place], [place], [address] °Phone: [phone]   Fax:  [phone] ° °Patient Details  °Name: Keith [name] °MRN: [id number] °Date of Birth: 04/26/1941 °Referring Provider:  No ref. provider found ° °Encounter Date: 06/13/2021 ° °PHYSICAL THERAPY DISCHARGE SUMMARY ° °Visits from Start of Care: 3 ° °Current functional level related to goals / functional outcomes: °Pt self discharged on 06/12/20- no more vertigo and does not feel he is in need of skilled PT moving forward.  °  °Remaining deficits: °Vertigo resolved, unable to assess other goals  °  °Education / Equipment: °N/A   ° °Patient agrees to discharge. Patient goals were partially met. Patient is being discharged due to being pleased with the current functional level. ° °Kristen U PT, DPT, PN2  ° °Supplemental Physical Therapist °[place]  ° ° °Pager [phone] °Acute Rehab Office [phone] ° ° °[place] °[facility] °[address] °[place], [place], [address] °Phone: [phone]   Fax:  [phone] °

## 2021-06-14 ENCOUNTER — Ambulatory Visit: Payer: Medicare Other | Admitting: Physical Therapy

## 2021-06-15 ENCOUNTER — Ambulatory Visit: Payer: Medicare Other | Admitting: Physical Therapy

## 2021-06-15 DIAGNOSIS — H524 Presbyopia: Secondary | ICD-10-CM | POA: Diagnosis not present

## 2021-06-15 DIAGNOSIS — H401221 Low-tension glaucoma, left eye, mild stage: Secondary | ICD-10-CM | POA: Diagnosis not present

## 2021-06-15 DIAGNOSIS — H353131 Nonexudative age-related macular degeneration, bilateral, early dry stage: Secondary | ICD-10-CM | POA: Diagnosis not present

## 2021-06-20 DIAGNOSIS — N1831 Chronic kidney disease, stage 3a: Secondary | ICD-10-CM | POA: Diagnosis not present

## 2021-06-20 DIAGNOSIS — R7303 Prediabetes: Secondary | ICD-10-CM | POA: Diagnosis not present

## 2021-06-20 DIAGNOSIS — I129 Hypertensive chronic kidney disease with stage 1 through stage 4 chronic kidney disease, or unspecified chronic kidney disease: Secondary | ICD-10-CM | POA: Diagnosis not present

## 2021-06-20 DIAGNOSIS — I2510 Atherosclerotic heart disease of native coronary artery without angina pectoris: Secondary | ICD-10-CM | POA: Diagnosis not present

## 2021-06-20 DIAGNOSIS — E7801 Familial hypercholesterolemia: Secondary | ICD-10-CM | POA: Diagnosis not present

## 2021-06-20 DIAGNOSIS — M1990 Unspecified osteoarthritis, unspecified site: Secondary | ICD-10-CM | POA: Diagnosis not present

## 2021-06-21 DIAGNOSIS — M545 Low back pain, unspecified: Secondary | ICD-10-CM | POA: Diagnosis not present

## 2021-06-21 DIAGNOSIS — M6281 Muscle weakness (generalized): Secondary | ICD-10-CM | POA: Diagnosis not present

## 2021-06-21 DIAGNOSIS — M5416 Radiculopathy, lumbar region: Secondary | ICD-10-CM | POA: Diagnosis not present

## 2021-06-25 DIAGNOSIS — I10 Essential (primary) hypertension: Secondary | ICD-10-CM | POA: Diagnosis not present

## 2021-06-26 DIAGNOSIS — Z9109 Other allergy status, other than to drugs and biological substances: Secondary | ICD-10-CM | POA: Diagnosis not present

## 2021-06-26 DIAGNOSIS — M5136 Other intervertebral disc degeneration, lumbar region: Secondary | ICD-10-CM | POA: Diagnosis not present

## 2021-06-26 DIAGNOSIS — E7800 Pure hypercholesterolemia, unspecified: Secondary | ICD-10-CM | POA: Diagnosis not present

## 2021-06-26 DIAGNOSIS — M1990 Unspecified osteoarthritis, unspecified site: Secondary | ICD-10-CM | POA: Diagnosis not present

## 2021-06-26 DIAGNOSIS — R946 Abnormal results of thyroid function studies: Secondary | ICD-10-CM | POA: Diagnosis not present

## 2021-06-26 DIAGNOSIS — I129 Hypertensive chronic kidney disease with stage 1 through stage 4 chronic kidney disease, or unspecified chronic kidney disease: Secondary | ICD-10-CM | POA: Diagnosis not present

## 2021-06-26 DIAGNOSIS — N1831 Chronic kidney disease, stage 3a: Secondary | ICD-10-CM | POA: Diagnosis not present

## 2021-06-26 DIAGNOSIS — I5030 Unspecified diastolic (congestive) heart failure: Secondary | ICD-10-CM | POA: Diagnosis not present

## 2021-06-28 DIAGNOSIS — M5416 Radiculopathy, lumbar region: Secondary | ICD-10-CM | POA: Diagnosis not present

## 2021-06-28 DIAGNOSIS — M6281 Muscle weakness (generalized): Secondary | ICD-10-CM | POA: Diagnosis not present

## 2021-06-28 DIAGNOSIS — M545 Low back pain, unspecified: Secondary | ICD-10-CM | POA: Diagnosis not present

## 2021-06-29 ENCOUNTER — Ambulatory Visit: Payer: Medicare Other

## 2021-06-29 ENCOUNTER — Other Ambulatory Visit: Payer: Self-pay

## 2021-06-29 DIAGNOSIS — I5042 Chronic combined systolic (congestive) and diastolic (congestive) heart failure: Secondary | ICD-10-CM

## 2021-07-05 DIAGNOSIS — M5416 Radiculopathy, lumbar region: Secondary | ICD-10-CM | POA: Diagnosis not present

## 2021-07-05 DIAGNOSIS — M545 Low back pain, unspecified: Secondary | ICD-10-CM | POA: Diagnosis not present

## 2021-07-05 DIAGNOSIS — M6281 Muscle weakness (generalized): Secondary | ICD-10-CM | POA: Diagnosis not present

## 2021-07-06 ENCOUNTER — Ambulatory Visit: Payer: Medicare Other | Admitting: Cardiology

## 2021-07-06 DIAGNOSIS — H401221 Low-tension glaucoma, left eye, mild stage: Secondary | ICD-10-CM | POA: Diagnosis not present

## 2021-07-12 DIAGNOSIS — M5416 Radiculopathy, lumbar region: Secondary | ICD-10-CM | POA: Diagnosis not present

## 2021-07-12 DIAGNOSIS — M6281 Muscle weakness (generalized): Secondary | ICD-10-CM | POA: Diagnosis not present

## 2021-07-12 DIAGNOSIS — M545 Low back pain, unspecified: Secondary | ICD-10-CM | POA: Diagnosis not present

## 2021-07-17 ENCOUNTER — Other Ambulatory Visit: Payer: Self-pay

## 2021-07-17 DIAGNOSIS — M5416 Radiculopathy, lumbar region: Secondary | ICD-10-CM | POA: Diagnosis not present

## 2021-07-17 DIAGNOSIS — M545 Low back pain, unspecified: Secondary | ICD-10-CM

## 2021-07-20 ENCOUNTER — Ambulatory Visit: Payer: Medicare Other | Admitting: Cardiology

## 2021-07-20 DIAGNOSIS — R1084 Generalized abdominal pain: Secondary | ICD-10-CM | POA: Diagnosis not present

## 2021-07-20 DIAGNOSIS — K8689 Other specified diseases of pancreas: Secondary | ICD-10-CM | POA: Diagnosis not present

## 2021-07-20 DIAGNOSIS — N401 Enlarged prostate with lower urinary tract symptoms: Secondary | ICD-10-CM | POA: Diagnosis not present

## 2021-07-20 DIAGNOSIS — N281 Cyst of kidney, acquired: Secondary | ICD-10-CM | POA: Diagnosis not present

## 2021-07-20 DIAGNOSIS — N4889 Other specified disorders of penis: Secondary | ICD-10-CM | POA: Diagnosis not present

## 2021-07-20 DIAGNOSIS — R3912 Poor urinary stream: Secondary | ICD-10-CM | POA: Diagnosis not present

## 2021-07-20 DIAGNOSIS — K4020 Bilateral inguinal hernia, without obstruction or gangrene, not specified as recurrent: Secondary | ICD-10-CM | POA: Diagnosis not present

## 2021-07-20 DIAGNOSIS — I700 Atherosclerosis of aorta: Secondary | ICD-10-CM | POA: Diagnosis not present

## 2021-07-25 DIAGNOSIS — I10 Essential (primary) hypertension: Secondary | ICD-10-CM | POA: Diagnosis not present

## 2021-07-27 ENCOUNTER — Other Ambulatory Visit: Payer: Medicare Other

## 2021-08-07 ENCOUNTER — Emergency Department (HOSPITAL_COMMUNITY): Payer: Medicare Other

## 2021-08-07 ENCOUNTER — Encounter (HOSPITAL_COMMUNITY): Payer: Self-pay

## 2021-08-07 ENCOUNTER — Emergency Department (HOSPITAL_COMMUNITY)
Admission: EM | Admit: 2021-08-07 | Discharge: 2021-08-07 | Disposition: A | Discharge status: Home / Self Care | Payer: Medicare Other | Attending: Emergency Medicine | Admitting: Emergency Medicine

## 2021-08-07 DIAGNOSIS — I129 Hypertensive chronic kidney disease with stage 1 through stage 4 chronic kidney disease, or unspecified chronic kidney disease: Secondary | ICD-10-CM | POA: Diagnosis not present

## 2021-08-07 DIAGNOSIS — N183 Chronic kidney disease, stage 3 unspecified: Secondary | ICD-10-CM | POA: Insufficient documentation

## 2021-08-07 DIAGNOSIS — R0602 Shortness of breath: Secondary | ICD-10-CM | POA: Insufficient documentation

## 2021-08-07 DIAGNOSIS — N400 Enlarged prostate without lower urinary tract symptoms: Secondary | ICD-10-CM | POA: Diagnosis not present

## 2021-08-07 DIAGNOSIS — I10 Essential (primary) hypertension: Secondary | ICD-10-CM | POA: Diagnosis not present

## 2021-08-07 DIAGNOSIS — I2510 Atherosclerotic heart disease of native coronary artery without angina pectoris: Secondary | ICD-10-CM | POA: Insufficient documentation

## 2021-08-07 DIAGNOSIS — R1013 Epigastric pain: Secondary | ICD-10-CM | POA: Insufficient documentation

## 2021-08-07 DIAGNOSIS — D72829 Elevated white blood cell count, unspecified: Secondary | ICD-10-CM | POA: Insufficient documentation

## 2021-08-07 DIAGNOSIS — N281 Cyst of kidney, acquired: Secondary | ICD-10-CM | POA: Diagnosis not present

## 2021-08-07 LAB — COMPREHENSIVE METABOLIC PANEL
ALT: 13 U/L (ref 0–44)
AST: 16 U/L (ref 15–41)
Albumin: 4.2 g/dL (ref 3.5–5.0)
Alkaline Phosphatase: 52 U/L (ref 38–126)
Anion gap: 8 (ref 5–15)
BUN: 26 mg/dL — ABNORMAL HIGH (ref 8–23)
CO2: 29 mmol/L (ref 22–32)
Calcium: 9.4 mg/dL (ref 8.9–10.3)
Chloride: 100 mmol/L (ref 98–111)
Creatinine, Ser: 1.54 mg/dL — ABNORMAL HIGH (ref 0.61–1.24)
GFR, Estimated: 45 mL/min — ABNORMAL LOW (ref >60)
Glucose, Bld: 111 mg/dL — ABNORMAL HIGH (ref 70–99)
Potassium: 4 mmol/L (ref 3.5–5.1)
Sodium: 137 mmol/L (ref 135–145)
Total Bilirubin: 0.7 mg/dL (ref 0.3–1.2)
Total Protein: 7.3 g/dL (ref 6.5–8.1)

## 2021-08-07 LAB — CBC WITH DIFFERENTIAL/PLATELET
Abs Immature Granulocytes: 0.12 10*3/uL — ABNORMAL HIGH (ref 0.00–0.07)
Basophils Absolute: 0.1 10*3/uL (ref 0.0–0.1)
Basophils Relative: 1 %
Eosinophils Absolute: 0.3 10*3/uL (ref 0.0–0.5)
Eosinophils Relative: 2 %
HCT: 48.7 % (ref 39.0–52.0)
Hemoglobin: 16.1 g/dL (ref 13.0–17.0)
Immature Granulocytes: 1 %
Lymphocytes Relative: 12 %
Lymphs Abs: 1.7 10*3/uL (ref 0.7–4.0)
MCH: 30 pg (ref 26.0–34.0)
MCHC: 33.1 g/dL (ref 30.0–36.0)
MCV: 90.9 fL (ref 80.0–100.0)
Monocytes Absolute: 1 10*3/uL (ref 0.1–1.0)
Monocytes Relative: 7 %
Neutro Abs: 10.8 10*3/uL — ABNORMAL HIGH (ref 1.7–7.7)
Neutrophils Relative %: 77 %
Platelets: 153 10*3/uL (ref 150–400)
RBC: 5.36 MIL/uL (ref 4.22–5.81)
RDW: 14 % (ref 11.5–15.5)
WBC: 14 10*3/uL — ABNORMAL HIGH (ref 4.0–10.5)
nRBC: 0 % (ref 0.0–0.2)

## 2021-08-07 LAB — URINALYSIS, ROUTINE W REFLEX MICROSCOPIC
Bacteria, UA: NONE SEEN
Bilirubin Urine: NEGATIVE
Glucose, UA: NEGATIVE mg/dL
Hgb urine dipstick: NEGATIVE
Ketones, ur: NEGATIVE mg/dL
Nitrite: NEGATIVE
Protein, ur: NEGATIVE mg/dL
Specific Gravity, Urine: 1.021 (ref 1.005–1.030)
pH: 5 (ref 5.0–8.0)

## 2021-08-07 LAB — TROPONIN I (HIGH SENSITIVITY): Troponin I (High Sensitivity): 10 ng/L (ref <18)

## 2021-08-07 LAB — LIPASE, BLOOD: Lipase: 36 U/L (ref 11–51)

## 2021-08-07 MED ORDER — SODIUM CHLORIDE 0.9 % IV BOLUS
500.0000 mL | Freq: Once | INTRAVENOUS | Status: AC
  Administered 2021-08-07: 500 mL via INTRAVENOUS

## 2021-08-07 MED ORDER — LIDOCAINE VISCOUS HCL 2 % MT SOLN
15.0000 mL | Freq: Once | OROMUCOSAL | Status: AC
  Administered 2021-08-07: 15 mL via ORAL
  Filled 2021-08-07: qty 15

## 2021-08-07 MED ORDER — SODIUM CHLORIDE (PF) 0.9 % IJ SOLN
INTRAMUSCULAR | Status: AC
  Filled 2021-08-07: qty 50

## 2021-08-07 MED ORDER — ALUM & MAG HYDROXIDE-SIMETH 200-200-20 MG/5ML PO SUSP
30.0000 mL | Freq: Once | ORAL | Status: AC
  Administered 2021-08-07: 30 mL via ORAL
  Filled 2021-08-07: qty 30

## 2021-08-07 MED ORDER — IOHEXOL 300 MG/ML  SOLN
100.0000 mL | Freq: Once | INTRAMUSCULAR | Status: AC | PRN
  Administered 2021-08-07: 100 mL via INTRAVENOUS

## 2021-08-07 NOTE — ED Provider Notes (Signed)
Emergency Department Provider Note   I have reviewed the triage vital signs and the nursing notes.   HISTORY  Chief Complaint Abdominal Pain   HPI Keith [name] is a 81 y.o. male with past medical history reviewed below presents emergency department with epigastric abdominal discomfort with burning pain into the chest.  Symptoms have been ongoing for the past several weeks.  He states last night he felt somewhat worse and had trouble sleeping due to discomfort in his epigastric region and feeling some shortness of breath.  He takes Protonix daily as prescribed.  He describes some mild constipation symptoms which prompted his PCP to recommend enema.  He use this and had some relief.  He continues to have daily bowel movements including last BM which was this morning.  No fevers.   Past Medical History:  Diagnosis Date   Abnormal nuclear stress test 02/24/2010   Nuclear stress 02/24/10 Infero lateral ischemia     Allergic rhinitis, cause unspecified    skin test pos 02-23-2008   Angina pectoris (HCC)    CAD (coronary artery disease)    CKD (chronic kidney disease) stage 3, GFR 30-59 ml/min (HCC) 07/23/2018   Cough    Dyspnea on exertion 07/23/2018   GERD (gastroesophageal reflux disease)    Hypercholesteremia 07/23/2018   Ischemia    Kidney stone    Migraine    Moderate obesity 07/23/2018   Nephrolithiasis    Resistant hypertension 07/23/2018   TIA (transient ischemic attack)    Unspecified essential hypertension     Review of Systems  Constitutional: No fever/chills Eyes: No visual changes. ENT: No sore throat. Cardiovascular: Denies chest pain. Respiratory: Denies shortness of breath. Gastrointestinal: No abdominal pain.  No nausea, no vomiting.  No diarrhea.  No constipation. Genitourinary: Negative for dysuria. Musculoskeletal: Negative for back pain. Skin: Negative for rash. Neurological: Negative for headaches, focal weakness or  numbness.  ____________________________________________   PHYSICAL EXAM:  VITAL SIGNS: ED Triage Vitals [08/07/21 1143]  Enc Vitals Group     BP (!) 150/77     Pulse Rate 80     Resp 20     Temp 98.2 F (36.8 C)     Temp src      SpO2 96 %   Constitutional: Alert and oriented. Well appearing and in no acute distress. Eyes: Conjunctivae are normal.  Head: Atraumatic. Nose: No congestion/rhinnorhea. Mouth/Throat: Mucous membranes are moist.   Neck: No stridor.  Cardiovascular: Normal rate, regular rhythm. Good peripheral circulation. Grossly normal heart sounds.   Respiratory: Normal respiratory effort.  No retractions. Lungs CTAB. Gastrointestinal: Soft with mild epigastric tenderness. No RUQ tenderness. No peritonitis. No distention.  Musculoskeletal: No gross deformities of extremities. Neurologic:  Normal speech and language.  Skin:  Skin is warm, dry and intact. No rash noted.   ____________________________________________   LABS (all labs ordered are listed, but only abnormal results are displayed)  Labs Reviewed  COMPREHENSIVE METABOLIC PANEL - Abnormal; Notable for the following components:      Result Value   Glucose, Bld 111 (*)    BUN 26 (*)    Creatinine, Ser 1.54 (*)    GFR, Estimated 45 (*)    All other components within normal limits  CBC WITH DIFFERENTIAL/PLATELET - Abnormal; Notable for the following components:   WBC 14.0 (*)    Neutro Abs 10.8 (*)    Abs Immature Granulocytes 0.12 (*)    All other components within normal limits  URINALYSIS, ROUTINE  W REFLEX MICROSCOPIC - Abnormal; Notable for the following components:   Leukocytes,Ua TRACE (*)    All other components within normal limits  LIPASE, BLOOD  TROPONIN I (HIGH SENSITIVITY)   ____________________________________________  EKG   EKG Interpretation  Date/Time:  Monday August 07 2021 15:53:23 EDT Ventricular Rate:  78 PR Interval:  169 QRS Duration: 109 QT Interval:  422 QTC  Calculation: 449 R Axis:   -32 Text Interpretation: Sinus rhythm Ventricular premature complex Aberrant conduction of SV complex(es) Left axis deviation Anterior infarct, old Nonspecific T abnormalities, lateral leads Similar to 2009 tracing Confirmed by Alona Bene [phone]) on 08/07/2021 3:56:10 PM        ____________________________________________  RADIOLOGY  CT ABDOMEN PELVIS W CONTRAST  Result Date: 08/07/2021 CLINICAL DATA:  Acute abdominal pain. EXAM: CT ABDOMEN AND PELVIS WITH CONTRAST TECHNIQUE: Multidetector CT imaging of the abdomen and pelvis was performed using the standard protocol following bolus administration of intravenous contrast. RADIATION DOSE REDUCTION: This exam was performed according to the departmental dose-optimization program which includes automated exposure control, adjustment of the mA and/or kV according to patient size and/or use of iterative reconstruction technique. CONTRAST:  OMNIPAQUE IOHEXOL 300 MG/ML  SOLN COMPARISON:  CT abdomen and pelvis 07/20/2021 FINDINGS: Lower chest: No acute abnormality. Hepatobiliary: No focal liver abnormality is seen. No gallstones, gallbladder wall thickening, or biliary dilatation. Pancreas: Unremarkable. No pancreatic ductal dilatation or surrounding inflammatory changes. Spleen: Normal in size without focal abnormality. Adrenals/Urinary Tract: There is no hydronephrosis or perinephric fluid. Adrenal glands and bladder are within normal limits. There is a 7.8 x 5.5 cm cyst in the left kidney which is unchanged. Stomach/Bowel: Stomach is within normal limits. Appendix appears normal. No evidence of bowel wall thickening, distention, or inflammatory changes. Vascular/Lymphatic: Aortic atherosclerosis. No enlarged abdominal or pelvic lymph nodes. Reproductive: Prostate gland is enlarged measuring 5.4 x 5.1 x 6.1 cm, similar the prior study. Other: There are small fat containing bilateral inguinal and umbilical hernias. There is no  ascites. Musculoskeletal: Multilevel degenerative changes affect the spine. IMPRESSION: 1. No acute localizing process in the abdomen or pelvis. 2. Stable prostatomegaly. 3.  Aortic Atherosclerosis (ICD10-I70.0). Electronically Signed   By: Darliss Cheney M.D.   On: 08/07/2021 15:49    ____________________________________________   PROCEDURES  Procedure(s) performed:   Procedures  None  ____________________________________________   INITIAL IMPRESSION / ASSESSMENT AND PLAN / ED COURSE  Pertinent labs & imaging results that were available during my care of the patient were reviewed by me and considered in my medical decision making (see chart for details).   This patient is Presenting for Evaluation of epigastric abdominal pain, which does require a range of treatment options, and is a complaint that involves a high risk of morbidity and mortality.  The Differential Diagnoses includes but is not exclusive to acute cholecystitis, intrathoracic causes for epigastric abdominal pain, gastritis, duodenitis, pancreatitis, small bowel or large bowel obstruction, abdominal aortic aneurysm, hernia, gastritis, etc.   Critical Interventions-    Medications  alum & mag hydroxide-simeth (MAALOX/MYLANTA) 200-200-20 MG/5ML suspension 30 mL (30 mLs Oral Given 08/07/21 1606)    And  lidocaine (XYLOCAINE) 2 % viscous mouth solution 15 mL (15 mLs Oral Given 08/07/21 1606)  sodium chloride (PF) 0.9 % injection (  Given by Other 08/07/21 1516)  sodium chloride 0.9 % bolus 500 mL (500 mLs Intravenous New Bag/Given 08/07/21 1610)  iohexol (OMNIPAQUE) 300 MG/ML solution 100 mL (100 mLs Intravenous Contrast Given 08/07/21 1525)  Reassessment after intervention: Symptoms improved from arrival.    I did obtain Additional Historical Information from wife at bedside.  I decided to review pertinent External Data, and in summary no recent ED visits.   Clinical Laboratory Tests Ordered, included with mild  leukocytosis.  Creatinine of 1.54.  Lipase negative.  UA without acute findings.   Radiologic Tests Ordered, included CT abdomen/pelvis. I independently interpreted the images and agree with radiology interpretation.   Cardiac Monitor Tracing which shows NSR.    Social Determinants of Health Risk no smoking history.   Medical Decision Making: Summary:  Patient presents emergency department with epigastric abdominal pain burning discomfort.  Mild leukocytosis.  CT imaging ordered during the MSE process.  Lipase negative.  CT pending.   Reevaluation with update and discussion with patient and wife. Symptoms improved here with treatment. EKG and troponin are reassuring. With > 24 hours of constant pain and several weeks of pain overall, do not plan on trending troponin.   Disposition: discharge  ____________________________________________  FINAL CLINICAL IMPRESSION(S) / ED DIAGNOSES  Final diagnoses:  Epigastric abdominal pain    Note:  This document was prepared using Dragon voice recognition software and may include unintentional dictation errors.  Alona Bene, MD, [facility]    Robertt Buda, Arlyss Repress, MD 08/07/21 [phone]

## 2021-08-07 NOTE — ED Provider Triage Note (Signed)
Emergency Medicine Provider Triage Evaluation Note ? ?Kaire Stary Bracey , a 81 y.o. male  was evaluated in triage.  Pt complains of epigastric abdominal pain radiating up towards his neck.  Patient does have a history of GERD and was previously taking pantoprazole with every meal although this has been decreased down to twice daily.  Pain is worse after eating and when lying down flat.  He is concerned that his voice seems to be changing as well.  He also endorses some right flank pain without urinary symptoms. ? ?Review of Systems  ?Positive: Epigastric pain, voice hoarseness ?Negative: Vomiting, diarrhea ? ?Physical Exam  ?BP (!) 150/77 (BP Location: Right Arm)   Pulse 80   Temp 98.2 ?F (36.8 ?C)   Resp 20   SpO2 96%  ?Gen:   Awake, no distress   ?Resp:  Normal effort  ?MSK:   Moves extremities without difficulty  ?Other:  Abdomen is soft, nontender to palpation.  Mild tenderness to palpation of the right flank, more posteriorly without evidence of CVA tenderness. ? ?Medical Decision Making  ?Medically screening exam initiated at 1:05 PM.  Appropriate orders placed.  Teron A Yates was informed that the remainder of the evaluation will be completed by another provider, this initial triage assessment does not replace that evaluation, and the importance of remaining in the ED until their evaluation is complete. ? ? ?  ?Janell Quiet, [place] ?08/07/21 1307 ? ?

## 2021-08-07 NOTE — ED Triage Notes (Signed)
Pt arrived via POV c/o diffuse abd pain x2-3 months. States he was seen by PCP, concerned for constipation, given rx. States he has been able to move bowels, last this morning, but pain is still there. Denies any other sx.  ?

## 2021-08-07 NOTE — Discharge Instructions (Signed)

## 2021-08-14 ENCOUNTER — Ambulatory Visit: Payer: Medicare Other | Admitting: Cardiology

## 2021-08-21 ENCOUNTER — Encounter: Payer: Self-pay | Admitting: Cardiology

## 2021-08-21 ENCOUNTER — Ambulatory Visit: Payer: Medicare Other | Admitting: Cardiology

## 2021-08-21 VITALS — BP 150/90 | HR 61 | Temp 98.5°F | Resp 16 | Ht 68.0 in | Wt 200.2 lb

## 2021-08-21 DIAGNOSIS — E7800 Pure hypercholesterolemia, unspecified: Secondary | ICD-10-CM

## 2021-08-21 DIAGNOSIS — I10 Essential (primary) hypertension: Secondary | ICD-10-CM

## 2021-08-21 DIAGNOSIS — K5904 Chronic idiopathic constipation: Secondary | ICD-10-CM

## 2021-08-21 DIAGNOSIS — N1831 Chronic kidney disease, stage 3a: Secondary | ICD-10-CM

## 2021-08-21 MED ORDER — NEBIVOLOL HCL 20 MG PO TABS: 1.0000 | ORAL_TABLET | Freq: Every day | ORAL | 3 refills | Status: DC

## 2021-08-21 MED ORDER — METAMUCIL SMOOTH TEXTURE 58.6 % PO POWD: 1.0000 | Freq: Every day | ORAL | 12 refills | Status: DC

## 2021-08-21 NOTE — Progress Notes (Signed)
? ?Primary Physician/Referring:  Janie Morning, DO ? ?Patient ID: RANDALE CARVALHO, male    DOB: 19-Sep-1940, 81 y.o.   MRN: [id number] ? ?Chief Complaint  ?Patient presents with  ? Hypertension  ? Hypertensive heart disease with heart failure   ? Follow-up  ?  6 months  ? ?HPI:   ? ?TAFARI HUMISTON  is a 81 y.o. [place] male with history of hypertension, gout, renal stones, hyperlipidemia. He has had difficulty in BP control.  I suspect nonischemic cardiomyopathy in view of global hypokinesis and uncontrolled hypertension and nuclear stress test revealing no focal abnormality.  He is enrolled in the patient in Remote Patient Monitoring and Principal Care Management as patient is high risk for hospitalization and complications from underlying medical conditions.  ? ?Patient presented to the emergency room on 08/07/2021 with abdominal discomfort ongoing for the past several weeks.  He was again hypertensive with blood pressure of 150/77 mmHg, no change in the EKG, renal function stable, discharged home.   ? ?His most recent home recordings of blood pressure show an overall decrease in his blood pres.   ? ?Past Medical History:  ?Diagnosis Date  ? Abnormal nuclear stress test 02/24/2010  ? Nuclear stress 02/24/10 Infero lateral ischemia    ? Allergic rhinitis, cause unspecified   ? skin test pos 02-23-2008  ? Angina pectoris ([place])   ? CAD (coronary artery disease)   ? CKD (chronic kidney disease) stage 3, GFR 30-59 ml/min (HCC) 07/23/2018  ? Dyspnea on exertion 07/23/2018  ? GERD (gastroesophageal reflux disease)   ? Hypercholesteremia 07/23/2018  ? Kidney stone   ? Migraine   ? Nephrolithiasis   ? Resistant hypertension 07/23/2018  ? TIA (transient ischemic attack)   ? ?Social History  ? ?Tobacco Use  ? Smoking status: Never  ? Smokeless tobacco: Never  ?Substance Use Topics  ? Alcohol use: No  ?  Alcohol/week: 0.0 standard drinks  ?  Comment: Pt used to have alcohol problems.  ? ?Marital Status: Married  ?ROS  ?Review of  Systems  ?Cardiovascular:  Negative for chest pain, leg swelling and orthopnea.  ?Respiratory:  Negative for shortness of breath.   ?Musculoskeletal:  Positive for arthritis and back pain.  ?Gastrointestinal:  Negative for melena.  ?Genitourinary:  Positive for frequency.  ?Neurological:  Positive for vertigo (chronic).  ? ?Objective  ?Blood pressure (!) 150/90, pulse 61, temperature 98.5 ?F (36.9 ?C), temperature source Temporal, resp. rate 16, height 5' 8"  (1.727 m), weight 200 lb 3.2 oz (90.8 kg), SpO2 95 %.  ? ?  08/21/2021  ?  3:31 PM 08/07/2021  ?  5:12 PM 08/07/2021  ?  5:00 PM  ?Vitals with BMI  ?Height 5' 8"     ?Weight 200 lbs 3 oz    ?BMI 30.45    ?Systolic 725  366  ?Diastolic 90  86  ?Pulse 61 70 67  ?  ? Physical Exam ?Constitutional:   ?   Appearance: He is obese.  ?HENT:  ?   Head: Atraumatic.  ?Neck:  ?   Vascular: No carotid bruit or JVD.  ?Cardiovascular:  ?   Rate and Rhythm: Normal rate and regular rhythm.  ?   Pulses: Normal pulses and intact distal pulses.  ?   Heart sounds: S1 normal and S2 normal. Murmur heard.  ?Harsh midsystolic murmur is present with a grade of 2/6 at the upper right sternal border.  ?  No gallop.  ?Pulmonary:  ?  Effort: Pulmonary effort is normal.  ?   Breath sounds: Normal breath sounds.  ?Abdominal:  ?   General: Bowel sounds are normal.  ?   Palpations: Abdomen is soft.  ?Musculoskeletal:     ?   General: Swelling: Trace bilateral ankle edema.  ?   Right lower leg: No edema.  ?   Left lower leg: No edema.  ?Skin: ?   General: Skin is warm.  ?   Capillary Refill: Capillary refill takes less than 2 seconds.  ? ?Laboratory examination:  ? ?Recent Labs  ?  08/07/21 ?1255  ?NA 137  ?K 4.0  ?CL 100  ?CO2 29  ?GLUCOSE 111*  ?BUN 26*  ?CREATININE 1.54*  ?CALCIUM 9.4  ?GFRNONAA 45*  ? ?estimated creatinine clearance is 40.5 mL/min (A) (by C-G formula based on SCr of 1.54 mg/dL (H)).  ? ?  Latest Ref Rng & Units 08/07/2021  ? 12:55 PM 04/18/2020  ?  2:38 PM  ?CMP  ?Glucose 70 - 99  mg/dL 111   99    ?BUN 8 - 23 mg/dL 26   25    ?Creatinine 0.61 - 1.24 mg/dL 1.54   1.52    ?Sodium 135 - 145 mmol/L 137   142    ?Potassium 3.5 - 5.1 mmol/L 4.0   4.1    ?Chloride 98 - 111 mmol/L 100   100    ?CO2 22 - 32 mmol/L 29   26    ?Calcium 8.9 - 10.3 mg/dL 9.4   9.7    ?Total Protein 6.5 - 8.1 g/dL 7.3     ?Total Bilirubin 0.3 - 1.2 mg/dL 0.7     ?Alkaline Phos 38 - 126 U/L 52     ?AST 15 - 41 U/L 16     ?ALT 0 - 44 U/L 13     ? ? ?  Latest Ref Rng & Units 08/07/2021  ? 12:55 PM 01/23/2008  ? 11:07 AM  ?CBC  ?WBC 4.0 - 10.5 K/uL 14.0   14.8    ?Hemoglobin 13.0 - 17.0 g/dL 16.1   16.9    ?Hematocrit 39.0 - 52.0 % 48.7   48.5    ?Platelets 150 - 400 K/uL 153   192    ? ? ?External labs:  ? ?Labs 06/20/2021: ? ?A1c 5.5%. ? ?Total cholesterol 145, triglycerides 139, HDL 38, LDL 81.  Non-HDL cholesterol 117. ? ?Hb 15.1/HCT 46.0, platelets 123, normal indicis. ? ?BUN 20, creatinine 1.53, EGFR 42 mL, potassium 4.0.  LFTs normal. ? ?Labs 03/03/2020: ?Hb 15.0/HCT 46.4, platelets 155, normal indicis. ?Sodium 146, serum glucose 94 mg, potassium 4.7, BUN 13.7/creatinine 1.6, EGFR 44 mL.  CMP otherwise normal. ?A1c 5.6%.  TSH normal at 3.13. ? ?Medications and allergies  ? ?Allergies  ?Allergen Reactions  ? Amlodipine Swelling  ? Labetalol Swelling  ?  Swollen lips   ? Lisinopril Cough  ? Penicillins Rash  ? Allopurinol   ?  Other reaction(s): rash  ? Amoxicillin-Pot Clavulanate   ?  Other reaction(s): swelling of the neck ad throat  ? Azithromycin   ?  Other reaction(s): Unknown  ? Benzonatate   ?  Other reaction(s): Unknown  ? Cefuroxime   ?  Other reaction(s): facial swelling, rash  ? Codeine   ?  Other reaction(s): itching, rash  ? Epinephrine   ?  Other reaction(s): tachycardia  ? Procaine Hcl   ?  REACTION: increased bp  ? Valsartan   ?  Other reaction(s): cough  ? Prednisone Rash  ?  ? ?Current Outpatient Medications:  ?  atorvastatin (LIPITOR) 10 MG tablet, Take 1 tablet (10 mg total) by mouth daily., Disp: 90  tablet, Rfl: 3 ?  hydrALAZINE (APRESOLINE) 100 MG tablet, Take 1 tablet (100 mg total) by mouth 4 (four) times daily. (Patient taking differently: Take 100 mg by mouth 3 (three) times daily.), Disp: 360 tablet, Rfl: 3 ?  hydrochlorothiazide (MICROZIDE) 12.5 MG capsule, TAKE 1 CAPSULE(12.5 MG) BY MOUTH DAILY, Disp: 30 capsule, Rfl: 3 ?  HYDROcodone-acetaminophen (NORCO) 10-325 MG tablet, Take 1 tablet by mouth every 6 (six) hours as needed., Disp: , Rfl:  ?  Nebivolol HCl (BYSTOLIC) 20 MG TABS, Take 1 tablet (20 mg total) by mouth daily., Disp: 100 tablet, Rfl: 3 ?  olmesartan (BENICAR) 20 MG tablet, Take 1 tablet (20 mg total) by mouth daily. (Patient taking differently: Take 20 mg by mouth daily. Taking in the evening), Disp: 14 tablet, Rfl: 0 ?  pantoprazole (PROTONIX) 40 MG tablet, Take 40 mg by mouth daily., Disp: , Rfl:  ?  psyllium (METAMUCIL SMOOTH TEXTURE) 58.6 % powder, Take 1 packet by mouth at bedtime., Disp: 283 g, Rfl: 12 ?  tamsulosin (FLOMAX) 0.4 MG CAPS capsule, Take 0.4 mg by mouth., Disp: , Rfl:  ? ?Radiology:  ? ?CT scan of the abdomen pelvis with contrast 08/07/2021:  ?1. No acute localizing process in the abdomen or pelvis. ?2. Stable prostatomegaly. ?3. Aortic Atherosclerosis.  No renal artery stenosis. ?4. There is a 7.8 x 5.5 cm cyst in the left kidney which is unchanged from 2018. ? ?Cardiac Studies:  ? ?Lexiscan myoview stress test 08/25/2018:  ?1. Lexiscan stress test with low level exercise was performed. Stress symptoms included chest pain, relieved with rest. Resting blood pressure was 150/98 mmHg and peak effect blood pressure was 148/90 mmHg. The resting electrocardiogram demonstrated normal sinus rhythm, normal resting conduction, low voltage, possible old anteroseptal infarct, no resting arrhythmias and normal rest repolarization.  The stress electrocardiogram with low level exercise and lexiscan showed sinus rhythm, normal stress conduction, frequent PVC's and nonspecific ST-T  changes.  Stress EKG is non diagnostic for ischemia as it is a pharmacologic stress.  ?2. The overall quality of the study is fair.  Left ventricular cavity is noted to be enlarged on the rest and stress studie

## 2021-08-25 DIAGNOSIS — I10 Essential (primary) hypertension: Secondary | ICD-10-CM | POA: Diagnosis not present

## 2021-09-19 ENCOUNTER — Ambulatory Visit: Payer: Medicare Other | Admitting: Student

## 2021-09-19 ENCOUNTER — Encounter: Payer: Self-pay | Admitting: Student

## 2021-09-19 VITALS — BP 112/72 | HR 71 | Temp 98.3°F | Resp 17 | Ht 68.0 in | Wt 197.4 lb

## 2021-09-19 DIAGNOSIS — I10 Essential (primary) hypertension: Secondary | ICD-10-CM

## 2021-09-19 NOTE — Progress Notes (Signed)
? ?Primary Physician/Referring:  Janie Morning, DO ? ?Patient ID: Keith [name], male    DOB: December 03, 1940, 81 y.o.   MRN: [id number] ? ?Chief Complaint  ?Patient presents with  ? Hypertension  ?  4 weeks  ? ?HPI:   ? ?Keith [name]  is a 81 y.o. caucasian male with history of hypertension, gout, renal stones, hyperlipidemia. He has had difficulty in BP control.  I suspect nonischemic cardiomyopathy in view of global hypokinesis and uncontrolled hypertension and nuclear stress test revealing no focal abnormality.  He is enrolled in the patient in Remote Patient Monitoring and Principal Care Management as patient is high risk for hospitalization and complications from underlying medical conditions.  ? ?Patient presents for 4-week follow-up of hypertension.  Last office visit discontinued a acedutolol and switched him to Bystolic 20 mg daily.  Patient denies chest pain, dizziness, worsening dyspnea, lightheadedness.  He is tolerating transition to Bystolic without issue and states cost is manageable. ? ?Patient's blood pressure is well controlled in the office today and on home readings at his improved overall. ? ?Past Medical History:  ?Diagnosis Date  ? Abnormal nuclear stress test 02/24/2010  ? Nuclear stress 02/24/10 Infero lateral ischemia    ? Allergic rhinitis, cause unspecified   ? skin test pos 02-23-2008  ? Angina pectoris ([place])   ? CAD (coronary artery disease)   ? CKD (chronic kidney disease) stage 3, GFR 30-59 ml/min (HCC) 07/23/2018  ? Dyspnea on exertion 07/23/2018  ? GERD (gastroesophageal reflux disease)   ? Hypercholesteremia 07/23/2018  ? Kidney stone   ? Migraine   ? Nephrolithiasis   ? Resistant hypertension 07/23/2018  ? TIA (transient ischemic attack)   ? ?Social History  ? ?Tobacco Use  ? Smoking status: Never  ? Smokeless tobacco: Never  ?Substance Use Topics  ? Alcohol use: No  ?  Alcohol/week: 0.0 standard drinks  ?  Comment: Pt used to have alcohol problems.  ? ?Marital Status: Married  ?ROS   ?Review of Systems  ?Cardiovascular:  Negative for chest pain, leg swelling and orthopnea.  ?Respiratory:  Negative for shortness of breath.   ?Musculoskeletal:  Positive for arthritis and back pain.  ?Gastrointestinal:  Negative for melena.  ?Genitourinary:  Positive for frequency.  ?Neurological:  Positive for vertigo (chronic).  ? ?Objective  ?Blood pressure 112/72, pulse 71, temperature 98.3 ?F (36.8 ?C), temperature source Temporal, resp. rate 17, height 5' 8"  (1.727 m), weight 197 lb 6.4 oz (89.5 kg), SpO2 97 %.  ? ?  09/19/2021  ?  2:49 PM 08/21/2021  ?  3:31 PM 08/07/2021  ?  5:12 PM  ?Vitals with BMI  ?Height 5' 8"  5' 8"    ?Weight 197 lbs 6 oz 200 lbs 3 oz   ?BMI 30.02 30.45   ?Systolic 834 196   ?Diastolic 72 90   ?Pulse 71 61 70  ?  ? Physical Exam ?Vitals reviewed.  ?Constitutional:   ?   Appearance: He is obese.  ?Neck:  ?   Vascular: No carotid bruit or JVD.  ?Cardiovascular:  ?   Rate and Rhythm: Normal rate and regular rhythm.  ?   Pulses: Normal pulses and intact distal pulses.  ?   Heart sounds: S1 normal and S2 normal. Murmur heard.  ?Harsh midsystolic murmur is present with a grade of 2/6 at the upper right sternal border.  ?  No gallop.  ?Pulmonary:  ?   Effort: Pulmonary effort is normal.  ?  Breath sounds: Normal breath sounds.  ?Musculoskeletal:     ?   General: Swelling: Trace bilateral ankle edema.  ?   Right lower leg: No edema.  ?   Left lower leg: No edema.  ?Skin: ?   General: Skin is warm.  ?   Capillary Refill: Capillary refill takes less than 2 seconds.  ? ?Laboratory examination:  ? ?Recent Labs  ?  08/07/21 ?1255  ?NA 137  ?K 4.0  ?CL 100  ?CO2 29  ?GLUCOSE 111*  ?BUN 26*  ?CREATININE 1.54*  ?CALCIUM 9.4  ?GFRNONAA 45*  ? ?CrCl cannot be calculated (Patient's most recent lab result is older than the maximum 21 days allowed.).  ? ?  Latest Ref Rng & Units 08/07/2021  ? 12:55 PM 04/18/2020  ?  2:38 PM  ?CMP  ?Glucose 70 - 99 mg/dL 111   99    ?BUN 8 - 23 mg/dL 26   25    ?Creatinine 0.61  - 1.24 mg/dL 1.54   1.52    ?Sodium 135 - 145 mmol/L 137   142    ?Potassium 3.5 - 5.1 mmol/L 4.0   4.1    ?Chloride 98 - 111 mmol/L 100   100    ?CO2 22 - 32 mmol/L 29   26    ?Calcium 8.9 - 10.3 mg/dL 9.4   9.7    ?Total Protein 6.5 - 8.1 g/dL 7.3     ?Total Bilirubin 0.3 - 1.2 mg/dL 0.7     ?Alkaline Phos 38 - 126 U/L 52     ?AST 15 - 41 U/L 16     ?ALT 0 - 44 U/L 13     ? ? ?  Latest Ref Rng & Units 08/07/2021  ? 12:55 PM 01/23/2008  ? 11:07 AM  ?CBC  ?WBC 4.0 - 10.5 K/uL 14.0   14.8    ?Hemoglobin 13.0 - 17.0 g/dL 16.1   16.9    ?Hematocrit 39.0 - 52.0 % 48.7   48.5    ?Platelets 150 - 400 K/uL 153   192    ? ? ?External labs:  ? ?Labs 06/20/2021: ? ?A1c 5.5%. ? ?Total cholesterol 145, triglycerides 139, HDL 38, LDL 81.  Non-HDL cholesterol 117. ? ?Hb 15.1/HCT 46.0, platelets 123, normal indicis. ? ?BUN 20, creatinine 1.53, EGFR 42 mL, potassium 4.0.  LFTs normal. ? ?Labs 03/03/2020: ?Hb 15.0/HCT 46.4, platelets 155, normal indicis. ?Sodium 146, serum glucose 94 mg, potassium 4.7, BUN 13.7/creatinine 1.6, EGFR 44 mL.  CMP otherwise normal. ?A1c 5.6%.  TSH normal at 3.13. ?Allergies  ? ?Allergies  ?Allergen Reactions  ? Amlodipine Swelling  ? Labetalol Swelling  ?  Swollen lips   ? Lisinopril Cough  ? Penicillins Rash  ? Allopurinol   ?  Other reaction(s): rash  ? Amoxicillin-Pot Clavulanate   ?  Other reaction(s): swelling of the neck ad throat  ? Azithromycin   ?  Other reaction(s): Unknown  ? Benzonatate   ?  Other reaction(s): Unknown  ? Cefuroxime   ?  Other reaction(s): facial swelling, rash  ? Codeine   ?  Other reaction(s): itching, rash  ? Epinephrine   ?  Other reaction(s): tachycardia  ? Procaine Hcl   ?  REACTION: increased bp  ? Valsartan   ?  Other reaction(s): cough  ? Prednisone Rash  ?  ? ? ?Medications Prior to Visit:  ? ?Outpatient Medications Prior to Visit  ?Medication Sig Dispense Refill  ? atorvastatin (  LIPITOR) 10 MG tablet Take 1 tablet (10 mg total) by mouth daily. 90 tablet 3  ?  hydrALAZINE (APRESOLINE) 100 MG tablet Take 1 tablet (100 mg total) by mouth 4 (four) times daily. (Patient taking differently: Take 100 mg by mouth 3 (three) times daily.) 360 tablet 3  ? hydrochlorothiazide (MICROZIDE) 12.5 MG capsule TAKE 1 CAPSULE(12.5 MG) BY MOUTH DAILY 30 capsule 3  ? HYDROcodone-acetaminophen (NORCO) 10-325 MG tablet Take 1 tablet by mouth every 6 (six) hours as needed.    ? ipratropium (ATROVENT) 0.03 % nasal spray Place 2 sprays into both nostrils every 12 (twelve) hours.    ? Nebivolol HCl (BYSTOLIC) 20 MG TABS Take 1 tablet (20 mg total) by mouth daily. 100 tablet 3  ? pantoprazole (PROTONIX) 40 MG tablet Take 40 mg by mouth daily.    ? psyllium (METAMUCIL SMOOTH TEXTURE) 58.6 % powder Take 1 packet by mouth at bedtime. 283 g 12  ? tamsulosin (FLOMAX) 0.4 MG CAPS capsule Take 0.4 mg by mouth.    ? olmesartan (BENICAR) 20 MG tablet Take 1 tablet (20 mg total) by mouth daily. (Patient taking differently: Take 20 mg by mouth daily. Taking in the evening) 14 tablet 0  ? ?No facility-administered medications prior to visit.  ? ?Final Medications at End of Visit   ? ?Current Meds  ?Medication Sig  ? atorvastatin (LIPITOR) 10 MG tablet Take 1 tablet (10 mg total) by mouth daily.  ? hydrALAZINE (APRESOLINE) 100 MG tablet Take 1 tablet (100 mg total) by mouth 4 (four) times daily. (Patient taking differently: Take 100 mg by mouth 3 (three) times daily.)  ? hydrochlorothiazide (MICROZIDE) 12.5 MG capsule TAKE 1 CAPSULE(12.5 MG) BY MOUTH DAILY  ? HYDROcodone-acetaminophen (NORCO) 10-325 MG tablet Take 1 tablet by mouth every 6 (six) hours as needed.  ? ipratropium (ATROVENT) 0.03 % nasal spray Place 2 sprays into both nostrils every 12 (twelve) hours.  ? Nebivolol HCl (BYSTOLIC) 20 MG TABS Take 1 tablet (20 mg total) by mouth daily.  ? pantoprazole (PROTONIX) 40 MG tablet Take 40 mg by mouth daily.  ? psyllium (METAMUCIL SMOOTH TEXTURE) 58.6 % powder Take 1 packet by mouth at bedtime.  ? tamsulosin  (FLOMAX) 0.4 MG CAPS capsule Take 0.4 mg by mouth.  ? ?Radiology:  ? ?CT scan of the abdomen pelvis with contrast 08/07/2021:  ?1. No acute localizing process in the abdomen or pelvis. ?2. Stable prostatomegaly

## 2021-09-24 DIAGNOSIS — I10 Essential (primary) hypertension: Secondary | ICD-10-CM | POA: Diagnosis not present

## 2021-10-03 ENCOUNTER — Other Ambulatory Visit: Payer: Self-pay | Admitting: Cardiology

## 2021-10-03 DIAGNOSIS — N1831 Chronic kidney disease, stage 3a: Secondary | ICD-10-CM

## 2021-10-03 DIAGNOSIS — I10 Essential (primary) hypertension: Secondary | ICD-10-CM

## 2021-10-25 DIAGNOSIS — I10 Essential (primary) hypertension: Secondary | ICD-10-CM | POA: Diagnosis not present

## 2021-11-24 DIAGNOSIS — I10 Essential (primary) hypertension: Secondary | ICD-10-CM | POA: Diagnosis not present

## 2021-12-20 ENCOUNTER — Encounter: Payer: Self-pay | Admitting: Cardiology

## 2021-12-20 ENCOUNTER — Ambulatory Visit: Payer: Medicare Other | Admitting: Cardiology

## 2021-12-20 VITALS — BP 130/82 | HR 60 | Temp 97.8°F | Resp 16 | Ht 68.0 in | Wt 205.2 lb

## 2021-12-20 DIAGNOSIS — N1831 Chronic kidney disease, stage 3a: Secondary | ICD-10-CM | POA: Diagnosis not present

## 2021-12-20 DIAGNOSIS — I10 Essential (primary) hypertension: Secondary | ICD-10-CM

## 2021-12-20 DIAGNOSIS — I5032 Chronic diastolic (congestive) heart failure: Secondary | ICD-10-CM

## 2021-12-20 MED ORDER — HYDRALAZINE HCL 50 MG PO TABS: 50.0000 mg | ORAL_TABLET | Freq: Three times a day (TID) | ORAL | 3 refills | Status: DC

## 2021-12-20 NOTE — Progress Notes (Signed)
Primary Physician/Referring:  Janie Morning, DO  Patient ID: Keith [name], male    DOB: [birth year], 81 y.o.   MRN: [id number]  Chief Complaint  Patient presents with   Hypertension   Follow-up    3 months   HPI:    Keith [name]  is a 81 y.o. caucasian male with history of hypertension, gout, renal stones, hyperlipidemia. He has had difficulty in BP control.  I suspect nonischemic cardiomyopathy in view of global hypokinesis and uncontrolled hypertension and nuclear stress test revealing no focal abnormality.  He is enrolled in the patient in Remote Patient Monitoring and Principal Care Management as patient is high risk for hospitalization and complications from underlying medical conditions.   Patient presents here for follow-up of hypertension specifically.  He is now tolerating all his medications well, since addition of Bystolic, blood pressure is significantly improved.  For some reason hydralazine was reduced to 100 mg daily from 4 times daily dosing.  He denies chest pain, denies dyspnea that is worsening, no leg edema, no PND or orthopnea.  Past Medical History:  Diagnosis Date   Abnormal nuclear stress test 02/24/2010   Nuclear stress 02/24/10 Infero lateral ischemia     Allergic rhinitis, cause unspecified    skin test pos 02-23-2008   Angina pectoris (HCC)    CAD (coronary artery disease)    CKD (chronic kidney disease) stage 3, GFR 30-59 ml/min (HCC) 07/23/2018   Dyspnea on exertion 07/23/2018   GERD (gastroesophageal reflux disease)    Hypercholesteremia 07/23/2018   Kidney stone    Migraine    Nephrolithiasis    Resistant hypertension 07/23/2018   TIA (transient ischemic attack)    Social History   Tobacco Use   Smoking status: Never   Smokeless tobacco: Never  Substance Use Topics   Alcohol use: No    Alcohol/week: 0.0 standard drinks of alcohol    Comment: Pt used to have alcohol problems.   Marital Status: Married  ROS  Review of Systems   Cardiovascular:  Negative for chest pain, dyspnea on exertion, leg swelling and orthopnea.  Musculoskeletal:  Positive for arthritis and back pain.  Gastrointestinal:  Negative for melena.  Genitourinary:  Positive for frequency.  Neurological:  Positive for vertigo (chronic).   Objective  Blood pressure 130/82, pulse 60, temperature 97.8 F (36.6 C), temperature source Temporal, resp. rate 16, height 5' 8" (1.727 m), weight 205 lb 3.2 oz (93.1 kg), SpO2 97 %.     12/20/2021    3:42 PM 12/20/2021    3:14 PM 09/19/2021    2:49 PM  Vitals with BMI  Height  5' 8" 5' 8"  Weight  205 lbs 3 oz 197 lbs 6 oz  BMI  35.70 17.79  Systolic 390 300 923  Diastolic 82 84 72  Pulse  60 71     Physical Exam Vitals reviewed.  Constitutional:      Appearance: He is obese.  Neck:     Vascular: No carotid bruit or JVD.  Cardiovascular:     Rate and Rhythm: Normal rate and regular rhythm.     Pulses: Normal pulses and intact distal pulses.     Heart sounds: S1 normal and S2 normal. Murmur heard.     Harsh midsystolic murmur is present with a grade of 2/6 at the upper right sternal border.     No gallop.  Pulmonary:     Effort: Pulmonary effort is normal.     Breath sounds:  Normal breath sounds.  Abdominal:     General: Bowel sounds are normal.     Palpations: Abdomen is soft.  Musculoskeletal:        General: Swelling: Trace bilateral ankle edema.     Right lower leg: No edema.     Left lower leg: No edema.  Skin:    General: Skin is warm.     Capillary Refill: Capillary refill takes less than 2 seconds.    Laboratory examination:   Recent Labs    08/07/21 1255  NA 137  K 4.0  CL 100  CO2 29  GLUCOSE 111*  BUN 26*  CREATININE 1.54*  CALCIUM 9.4  GFRNONAA 45*   CrCl cannot be calculated (Patient's most recent lab result is older than the maximum 21 days allowed.).     Latest Ref Rng & Units 08/07/2021   12:55 PM 04/18/2020    2:38 PM  CMP  Glucose 70 - 99 mg/dL 111  99    BUN 8 - 23 mg/dL 26  25   Creatinine 0.61 - 1.24 mg/dL 1.54  1.52   Sodium 135 - 145 mmol/L 137  142   Potassium 3.5 - 5.1 mmol/L 4.0  4.1   Chloride 98 - 111 mmol/L 100  100   CO2 22 - 32 mmol/L 29  26   Calcium 8.9 - 10.3 mg/dL 9.4  9.7   Total Protein 6.5 - 8.1 g/dL 7.3    Total Bilirubin 0.3 - 1.2 mg/dL 0.7    Alkaline Phos 38 - 126 U/L 52    AST 15 - 41 U/L 16    ALT 0 - 44 U/L 13        Latest Ref Rng & Units 08/07/2021   12:55 PM 01/23/2008   11:07 AM  CBC  WBC 4.0 - 10.5 K/uL 14.0  14.8   Hemoglobin 13.0 - 17.0 g/dL 16.1  16.9   Hematocrit 39.0 - 52.0 % 48.7  48.5   Platelets 150 - 400 K/uL 153  192     External labs:   Labs 07/19/2021:  A1c 5.5%.  Total cholesterol 145, triglycerides were 131, HDL 38, LDL 81.  Hb 15.1/HCT 46.0, platelets 123.  BUN 20, creatinine of 1.53, EGFR 42 mL, potassium 4.0. LFTs normal. Urine analysis normal.    Allergies  Allergen Reactions   Amlodipine Swelling   Labetalol Swelling    Swollen lips    Lisinopril Cough   Penicillins Rash   Allopurinol     Other reaction(s): rash   Amoxicillin-Pot Clavulanate     Other reaction(s): swelling of the neck ad throat   Azithromycin     Other reaction(s): Unknown   Benzonatate     Other reaction(s): Unknown   Cefuroxime     Other reaction(s): facial swelling, rash   Codeine     Other reaction(s): itching, rash   Epinephrine     Other reaction(s): tachycardia   Procaine Hcl     REACTION: increased bp   Valsartan     Other reaction(s): cough   Prednisone Rash    Final Medications at End of Visit     Current Outpatient Medications:    atorvastatin (LIPITOR) 10 MG tablet, Take 1 tablet (10 mg total) by mouth daily., Disp: 90 tablet, Rfl: 3   hydrALAZINE (APRESOLINE) 50 MG tablet, Take 1 tablet (50 mg total) by mouth 3 (three) times daily., Disp: 270 tablet, Rfl: 3   hydrochlorothiazide (MICROZIDE) 12.5 MG capsule, TAKE 1 CAPSULE(12.5  MG) BY MOUTH DAILY, Disp: 30 capsule, Rfl:  3   HYDROcodone-acetaminophen (NORCO) 10-325 MG tablet, Take 1 tablet by mouth every 6 (six) hours as needed., Disp: , Rfl:    ipratropium (ATROVENT) 0.03 % nasal spray, Place 2 sprays into both nostrils every 12 (twelve) hours., Disp: , Rfl:    Nebivolol HCl (BYSTOLIC) 20 MG TABS, Take 1 tablet (20 mg total) by mouth daily., Disp: 100 tablet, Rfl: 3   pantoprazole (PROTONIX) 40 MG tablet, Take 40 mg by mouth daily., Disp: , Rfl:    psyllium (METAMUCIL SMOOTH TEXTURE) 58.6 % powder, Take 1 packet by mouth at bedtime., Disp: 283 g, Rfl: 12   tamsulosin (FLOMAX) 0.4 MG CAPS capsule, Take 0.4 mg by mouth., Disp: , Rfl:   Radiology:   CT scan of the abdomen pelvis with contrast 08/07/2021:  1. No acute localizing process in the abdomen or pelvis. 2. Stable prostatomegaly. 3. Aortic Atherosclerosis.  No renal artery stenosis. 4. There is a 7.8 x 5.5 cm cyst in the left kidney which is unchanged from 2018.  Cardiac Studies:   Lexiscan myoview stress test 08/25/2018:  1. Lexiscan stress test with low level exercise was performed. Stress symptoms included chest pain, relieved with rest. Resting blood pressure was 150/98 mmHg and peak effect blood pressure was 148/90 mmHg. The resting electrocardiogram demonstrated normal sinus rhythm, normal resting conduction, low voltage, possible old anteroseptal infarct, no resting arrhythmias and normal rest repolarization.  The stress electrocardiogram with low level exercise and lexiscan showed sinus rhythm, normal stress conduction, frequent PVC's and nonspecific ST-T changes.  Stress EKG is non diagnostic for ischemia as it is a pharmacologic stress.  2. The overall quality of the study is fair.  Left ventricular cavity is noted to be enlarged on the rest and stress studies.  Gated SPECT images reveal mild global decrease in myocardial thickening and wall motion.  The left ventricular ejection fraction was calculated or visually estimated to be 43%.  SPECT images  reveal medium sized, moderate intensity perfusion defect seen worse on rest images. While this may be due to attenuation artifact, ischemia in this region cannot be excluded.  3. Intermediate risk study.  4. Compared to 02/24/2010, no significant change.   Echocardiogram 06/29/2021:  Left ventricle cavity is normal in size. Mild concentric hypertrophy of  the left ventricle. Normal global wall motion. Normal LV systolic function  with LVEF 50-55%. Doppler evidence of grade I (impaired) diastolic  dysfunction, normal LAP.  Left atrial cavity is mildly dilated.  Trileaflet aortic valve with Mild aortic valve leaflet calcification.  Trace aortic valve stenosis. No regurgitation.  Mild (Grade I) mitral regurgitation.  Mild tricuspid regurgitation.  Mild pulmonic regurgitation.  No evidence of pulmonary hypertension.  Previous study on 02/01/2020 noted LVEF 35-40%. severe LA dilatation.  EKG   EKG 08/21/2021: Normal sinus rhythm at rate of 62 bpm, normal axis, poor R wave progression, cannot exclude anteroseptal infarct old.  Low-voltage complexes.  Nonspecific T abnormality.  No significant change from 11/22/2020.   Assessment     ICD-10-CM   1. Resistant hypertension  I10 hydrALAZINE (APRESOLINE) 50 MG tablet    2. Chronic diastolic heart failure (HCC)  I50.32     3. Stage 3a chronic kidney disease (HCC)  N18.31       Medications Discontinued During This Encounter  Medication Reason   hydrALAZINE (APRESOLINE) 100 MG tablet Dose change    Meds ordered this encounter  Medications   hydrALAZINE (APRESOLINE) 50  MG tablet    Sig: Take 1 tablet (50 mg total) by mouth 3 (three) times daily.    Dispense:  270 tablet    Refill:  3   Recommendations:   Keith [name] is a 81 y.o.  Caucasian male with history of hypertension, gout, renal stones, hyperlipidemia. He has had difficulty in BP control.  I suspect nonischemic cardiomyopathy in view of global hypokinesis and uncontrolled  hypertension and nuclear stress test revealing no focal abnormality.  He is enrolled in the patient in Remote Patient Monitoring and Principal Care Management as patient is high risk for hospitalization and complications from underlying medical conditions.   Since being on Bystolic, blood pressure is much improved.  He will continue with 20 mg of Bystolic, blood pressure was elevated upon presentation but did improve upon sitting, will change his hydralazine, presently he is only taking 100 mg once a day will change it to 50 mg 3 times daily.  No clinical evidence of heart failure.  No change in his cardiac murmur.  I will see him back in 6 months or sooner if problems.  He will continue with RPM.    Adrian Prows, PA-C 12/20/2021, 3:49 PM Office: [phone]

## 2021-12-21 DIAGNOSIS — M5136 Other intervertebral disc degeneration, lumbar region: Secondary | ICD-10-CM | POA: Diagnosis not present

## 2021-12-21 DIAGNOSIS — I129 Hypertensive chronic kidney disease with stage 1 through stage 4 chronic kidney disease, or unspecified chronic kidney disease: Secondary | ICD-10-CM | POA: Diagnosis not present

## 2021-12-21 DIAGNOSIS — N1831 Chronic kidney disease, stage 3a: Secondary | ICD-10-CM | POA: Diagnosis not present

## 2021-12-21 DIAGNOSIS — M1990 Unspecified osteoarthritis, unspecified site: Secondary | ICD-10-CM | POA: Diagnosis not present

## 2021-12-21 DIAGNOSIS — I5030 Unspecified diastolic (congestive) heart failure: Secondary | ICD-10-CM | POA: Diagnosis not present

## 2021-12-21 DIAGNOSIS — Z9109 Other allergy status, other than to drugs and biological substances: Secondary | ICD-10-CM | POA: Diagnosis not present

## 2021-12-21 DIAGNOSIS — E7801 Familial hypercholesterolemia: Secondary | ICD-10-CM | POA: Diagnosis not present

## 2021-12-21 DIAGNOSIS — R7303 Prediabetes: Secondary | ICD-10-CM | POA: Diagnosis not present

## 2021-12-21 DIAGNOSIS — R946 Abnormal results of thyroid function studies: Secondary | ICD-10-CM | POA: Diagnosis not present

## 2021-12-25 DIAGNOSIS — I10 Essential (primary) hypertension: Secondary | ICD-10-CM | POA: Diagnosis not present

## 2021-12-28 DIAGNOSIS — I129 Hypertensive chronic kidney disease with stage 1 through stage 4 chronic kidney disease, or unspecified chronic kidney disease: Secondary | ICD-10-CM | POA: Diagnosis not present

## 2021-12-28 DIAGNOSIS — Z0000 Encounter for general adult medical examination without abnormal findings: Secondary | ICD-10-CM | POA: Diagnosis not present

## 2021-12-28 DIAGNOSIS — I5030 Unspecified diastolic (congestive) heart failure: Secondary | ICD-10-CM | POA: Diagnosis not present

## 2021-12-28 DIAGNOSIS — M1990 Unspecified osteoarthritis, unspecified site: Secondary | ICD-10-CM | POA: Diagnosis not present

## 2021-12-28 DIAGNOSIS — N1831 Chronic kidney disease, stage 3a: Secondary | ICD-10-CM | POA: Diagnosis not present

## 2021-12-28 DIAGNOSIS — L853 Xerosis cutis: Secondary | ICD-10-CM | POA: Diagnosis not present

## 2021-12-28 DIAGNOSIS — E7800 Pure hypercholesterolemia, unspecified: Secondary | ICD-10-CM | POA: Diagnosis not present

## 2021-12-28 DIAGNOSIS — Z9109 Other allergy status, other than to drugs and biological substances: Secondary | ICD-10-CM | POA: Diagnosis not present

## 2021-12-28 DIAGNOSIS — M5136 Other intervertebral disc degeneration, lumbar region: Secondary | ICD-10-CM | POA: Diagnosis not present

## 2021-12-28 DIAGNOSIS — M542 Cervicalgia: Secondary | ICD-10-CM | POA: Diagnosis not present

## 2022-01-10 DIAGNOSIS — M5416 Radiculopathy, lumbar region: Secondary | ICD-10-CM | POA: Diagnosis not present

## 2022-01-10 DIAGNOSIS — M25552 Pain in left hip: Secondary | ICD-10-CM | POA: Diagnosis not present

## 2022-01-17 DIAGNOSIS — H353131 Nonexudative age-related macular degeneration, bilateral, early dry stage: Secondary | ICD-10-CM | POA: Diagnosis not present

## 2022-01-17 DIAGNOSIS — H5340 Unspecified visual field defects: Secondary | ICD-10-CM | POA: Diagnosis not present

## 2022-01-17 DIAGNOSIS — H40023 Open angle with borderline findings, high risk, bilateral: Secondary | ICD-10-CM | POA: Diagnosis not present

## 2022-01-17 DIAGNOSIS — H349 Unspecified retinal vascular occlusion: Secondary | ICD-10-CM | POA: Diagnosis not present

## 2022-01-24 DIAGNOSIS — I10 Essential (primary) hypertension: Secondary | ICD-10-CM | POA: Diagnosis not present

## 2022-02-23 DIAGNOSIS — I10 Essential (primary) hypertension: Secondary | ICD-10-CM | POA: Diagnosis not present

## 2022-02-26 ENCOUNTER — Telehealth: Payer: Self-pay

## 2022-02-26 DIAGNOSIS — I1A0 Resistant hypertension: Secondary | ICD-10-CM

## 2022-02-26 MED ORDER — SPIRONOLACTONE 25 MG PO TABS: 25.0000 mg | ORAL_TABLET | ORAL | 2 refills | Status: DC

## 2022-02-26 MED ORDER — CLONIDINE 0.1 MG/24HR TD PTWK: 0.1000 mg | MEDICATED_PATCH | TRANSDERMAL | 3 refills | Status: DC

## 2022-02-26 NOTE — Telephone Encounter (Signed)
Extremely difficult, I spoke to his wife regarding his diet and weight loss, states that they both keep very little.  They also eat very healthy.  They avoid salty food.  He has multiple medication allergies.  He is not on an ACE inhibitor or an ARB, valsartan caused cough.  Although he was on Benicar for a very long time without any side effects.  It was in some time.  August 2023 that the Benicar was dropped.  I would like to try spironolactone 25 mg in the morning, added clonidine patch 0.1 mg every week, will obtain BMP in 2 weeks.  Orders have been placed.  I spent 20 minutes on the telephone with the patient and in review of his medical charts.    ICD-10-CM   1. Resistant hypertension  I1A.0 spironolactone (ALDACTONE) 25 MG tablet    cloNIDine (CATAPRES - DOSED IN MG/24 HR) 0.1 mg/24hr patch    Basic metabolic panel     Orders Placed This Encounter  Procedures   Basic metabolic panel    Standing Status:   Future    Standing Expiration Date:   02/27/2023    Meds ordered this encounter  Medications   spironolactone (ALDACTONE) 25 MG tablet    Sig: Take 1 tablet (25 mg total) by mouth every morning.    Dispense:  30 tablet    Refill:  2   cloNIDine (CATAPRES - DOSED IN MG/24 HR) 0.1 mg/24hr patch    Sig: Place 1 patch (0.1 mg total) onto the skin once a week.    Dispense:  4 patch    Refill:  3

## 2022-02-26 NOTE — Telephone Encounter (Signed)
Patient called and reports that he thinks he is having a reaction to the hydralazine. He has had a lot of coughing/sinus issues/throat inflammation with occasional trouble breathing at nighttime. Patient said this is the same type of reaction he had to warfarin when he was in the hospital.  He has only been taking the hydralazine once, maybe twice daily due to the above complaints and is now holding the medication altogether.  Is there something else you want to add to his BP regimen?  Current Meds  Medication Sig   hydrochlorothiazide (MICROZIDE) 12.5 MG capsule TAKE 1 CAPSULE(12.5 MG) BY MOUTH DAILY   Nebivolol HCl (BYSTOLIC) 20 MG TABS Take 1 tablet (20 mg total) by mouth daily.    Average Systolic BP Level 161.09 mmHg Lowest Systolic BP Level 604 mmHg Highest Systolic BP Level 540 mmHg  02/26/2022 Monday at 02:49 PM 137 / 89      02/24/2022 Saturday at 09:11 PM 151 / 100      02/24/2022 Saturday at 10:31 AM 116 / 73      02/23/2022 Friday at 10:24 AM 121 / 70      02/22/2022 Thursday at 10:12 PM 167 / 89      02/22/2022 Thursday at 09:41 AM 165 / 89      02/21/2022 Wednesday at 08:41 PM 134 / 82      02/21/2022 Wednesday at 10:40 AM 124 / 81      02/20/2022 Tuesday at 10:25 PM 157 / 85      02/20/2022 Tuesday at 10:53 AM 142 / 76

## 2022-02-28 ENCOUNTER — Other Ambulatory Visit: Payer: Self-pay | Admitting: Cardiology

## 2022-02-28 DIAGNOSIS — E7800 Pure hypercholesterolemia, unspecified: Secondary | ICD-10-CM

## 2022-03-01 ENCOUNTER — Other Ambulatory Visit: Payer: Self-pay

## 2022-03-01 DIAGNOSIS — I1A0 Resistant hypertension: Secondary | ICD-10-CM

## 2022-03-16 ENCOUNTER — Other Ambulatory Visit: Payer: Self-pay | Admitting: Cardiology

## 2022-03-16 DIAGNOSIS — N1831 Chronic kidney disease, stage 3a: Secondary | ICD-10-CM

## 2022-03-16 DIAGNOSIS — I1A0 Resistant hypertension: Secondary | ICD-10-CM

## 2022-03-21 DIAGNOSIS — I1A0 Resistant hypertension: Secondary | ICD-10-CM | POA: Diagnosis not present

## 2022-03-22 LAB — BASIC METABOLIC PANEL
BUN/Creatinine Ratio: 15 (ref 10–24)
BUN: 28 mg/dL — ABNORMAL HIGH (ref 8–27)
CO2: 27 mmol/L (ref 20–29)
Calcium: 9.8 mg/dL (ref 8.6–10.2)
Chloride: 101 mmol/L (ref 96–106)
Creatinine, Ser: 1.83 mg/dL — ABNORMAL HIGH (ref 0.76–1.27)
Glucose: 110 mg/dL — ABNORMAL HIGH (ref 70–99)
Potassium: 4.4 mmol/L (ref 3.5–5.2)
Sodium: 141 mmol/L (ref 134–144)
eGFR: 36 mL/min/{1.73_m2} — ABNORMAL LOW (ref >59)

## 2022-03-25 NOTE — Progress Notes (Signed)
Will reduce Spironolactone to 25 mg 1/2 daily. Recheck BMP in 3 weeks

## 2022-03-25 NOTE — Progress Notes (Signed)
ICD-10-CM   1. Resistant hypertension  T2I.7 Basic metabolic panel    Basic metabolic panel    Basic metabolic panel

## 2022-03-26 ENCOUNTER — Other Ambulatory Visit: Payer: Self-pay

## 2022-03-26 DIAGNOSIS — I1A0 Resistant hypertension: Secondary | ICD-10-CM

## 2022-03-26 DIAGNOSIS — I10 Essential (primary) hypertension: Secondary | ICD-10-CM | POA: Diagnosis not present

## 2022-03-26 MED ORDER — SPIRONOLACTONE 25 MG PO TABS: 12.5000 mg | ORAL_TABLET | ORAL | 0 refills | Status: DC

## 2022-03-26 NOTE — Progress Notes (Signed)
Reducing dose of spironolactone to 12.5mg  daily due to lab results per Dr. Einar Gip.   Repeat BMP in 3 weeks. Patient aware of changes.

## 2022-03-26 NOTE — Addendum Note (Signed)
Addended by: Haynes Dage on: 49/06/98 71:21 PM   Modules accepted: Orders

## 2022-03-28 DIAGNOSIS — Z23 Encounter for immunization: Secondary | ICD-10-CM | POA: Diagnosis not present

## 2022-04-18 DIAGNOSIS — N401 Enlarged prostate with lower urinary tract symptoms: Secondary | ICD-10-CM | POA: Diagnosis not present

## 2022-04-18 DIAGNOSIS — R3912 Poor urinary stream: Secondary | ICD-10-CM | POA: Diagnosis not present

## 2022-04-18 DIAGNOSIS — N281 Cyst of kidney, acquired: Secondary | ICD-10-CM | POA: Diagnosis not present

## 2022-04-20 DIAGNOSIS — I1A0 Resistant hypertension: Secondary | ICD-10-CM | POA: Diagnosis not present

## 2022-04-21 LAB — BASIC METABOLIC PANEL
BUN/Creatinine Ratio: 17 (ref 10–24)
BUN: 29 mg/dL — ABNORMAL HIGH (ref 8–27)
CO2: 25 mmol/L (ref 20–29)
Calcium: 9.1 mg/dL (ref 8.6–10.2)
Chloride: 102 mmol/L (ref 96–106)
Creatinine, Ser: 1.67 mg/dL — ABNORMAL HIGH (ref 0.76–1.27)
Glucose: 97 mg/dL (ref 70–99)
Potassium: 4.4 mmol/L (ref 3.5–5.2)
Sodium: 133 mmol/L — ABNORMAL LOW (ref 134–144)
eGFR: 41 mL/min/{1.73_m2} — ABNORMAL LOW (ref >59)

## 2022-04-21 NOTE — Progress Notes (Signed)
Stable kidney function.

## 2022-04-25 DIAGNOSIS — I10 Essential (primary) hypertension: Secondary | ICD-10-CM | POA: Diagnosis not present

## 2022-04-25 NOTE — Progress Notes (Signed)
Called patient to inform him about his lab results. Pt understood.

## 2022-04-27 DIAGNOSIS — R1084 Generalized abdominal pain: Secondary | ICD-10-CM | POA: Diagnosis not present

## 2022-04-27 DIAGNOSIS — N400 Enlarged prostate without lower urinary tract symptoms: Secondary | ICD-10-CM | POA: Diagnosis not present

## 2022-04-27 DIAGNOSIS — M549 Dorsalgia, unspecified: Secondary | ICD-10-CM | POA: Diagnosis not present

## 2022-04-27 DIAGNOSIS — K4020 Bilateral inguinal hernia, without obstruction or gangrene, not specified as recurrent: Secondary | ICD-10-CM | POA: Diagnosis not present

## 2022-04-27 DIAGNOSIS — N320 Bladder-neck obstruction: Secondary | ICD-10-CM | POA: Diagnosis not present

## 2022-05-01 NOTE — Progress Notes (Signed)
Spoke with patient about test results. Patient acknowledged understanding.

## 2022-05-26 DIAGNOSIS — I10 Essential (primary) hypertension: Secondary | ICD-10-CM | POA: Diagnosis not present

## 2022-06-14 DIAGNOSIS — R0982 Postnasal drip: Secondary | ICD-10-CM | POA: Diagnosis not present

## 2022-06-14 DIAGNOSIS — J343 Hypertrophy of nasal turbinates: Secondary | ICD-10-CM | POA: Diagnosis not present

## 2022-06-14 DIAGNOSIS — J310 Chronic rhinitis: Secondary | ICD-10-CM | POA: Diagnosis not present

## 2022-06-21 ENCOUNTER — Telehealth: Payer: Self-pay

## 2022-06-21 ENCOUNTER — Ambulatory Visit: Payer: Medicare Other | Admitting: Cardiology

## 2022-06-21 ENCOUNTER — Encounter: Payer: Self-pay | Admitting: Cardiology

## 2022-06-21 VITALS — BP 138/80 | HR 86 | Ht 68.0 in | Wt 202.0 lb

## 2022-06-21 DIAGNOSIS — R7309 Other abnormal glucose: Secondary | ICD-10-CM | POA: Diagnosis not present

## 2022-06-21 DIAGNOSIS — I1A0 Resistant hypertension: Secondary | ICD-10-CM

## 2022-06-21 DIAGNOSIS — R946 Abnormal results of thyroid function studies: Secondary | ICD-10-CM | POA: Diagnosis not present

## 2022-06-21 DIAGNOSIS — I5032 Chronic diastolic (congestive) heart failure: Secondary | ICD-10-CM

## 2022-06-21 DIAGNOSIS — N1831 Chronic kidney disease, stage 3a: Secondary | ICD-10-CM

## 2022-06-21 DIAGNOSIS — Z13220 Encounter for screening for lipoid disorders: Secondary | ICD-10-CM | POA: Diagnosis not present

## 2022-06-21 DIAGNOSIS — Z79899 Other long term (current) drug therapy: Secondary | ICD-10-CM | POA: Diagnosis not present

## 2022-06-21 DIAGNOSIS — Z125 Encounter for screening for malignant neoplasm of prostate: Secondary | ICD-10-CM | POA: Diagnosis not present

## 2022-06-21 MED ORDER — CLONIDINE 0.1 MG/24HR TD PTWK: 0.1000 mg | MEDICATED_PATCH | TRANSDERMAL | 3 refills | Status: DC

## 2022-06-21 NOTE — Progress Notes (Signed)
Primary Physician/Referring:  Irena Reichmann, DO  Patient ID: Keith [name], male    DOB: [birth year], 82 y.o.   MRN: [id number]  Chief Complaint  Patient presents with   Resistant hypertension   Chronic diastolic heart failure   HPI:    Keith [name]  is a 82 y.o. Caucasian male with resistant hypertension, gout, renal stones, hyperlipidemia, truncal obesity presents here for follow-up of hypertension specifically.  He also has multiple medication allergies and intolerances.  He is enrolled in the patient in Remote Patient Monitoring and Principal Care Management as patient is high risk for hospitalization and complications from underlying medical conditions.   Patient presents here for follow-up of hypertension specifically.  He is now tolerating all his medications well, since addition of clonidine patch, his blood pressure has been very well-controlled.  He denies chest pain, denies dyspnea that is worsening, no leg edema, no PND or orthopnea.  Past Medical History:  Diagnosis Date   Abnormal nuclear stress test 02/24/2010   Nuclear stress 02/24/10 Infero lateral ischemia     Allergic rhinitis, cause unspecified    skin test pos 02-23-2008   Angina pectoris (HCC)    CAD (coronary artery disease)    CKD (chronic kidney disease) stage 3, GFR 30-59 ml/min (HCC) 07/23/2018   Dyspnea on exertion 07/23/2018   GERD (gastroesophageal reflux disease)    Hypercholesteremia 07/23/2018   Kidney stone    Migraine    Nephrolithiasis    Resistant hypertension 07/23/2018   TIA (transient ischemic attack)    Social History   Tobacco Use   Smoking status: Never   Smokeless tobacco: Never  Substance Use Topics   Alcohol use: No    Alcohol/week: 0.0 standard drinks of alcohol    Comment: Pt used to have alcohol problems.   Marital Status: Married  ROS  Review of Systems  Cardiovascular:  Negative for chest pain, dyspnea on exertion, leg swelling and orthopnea.  Musculoskeletal:   Positive for arthritis and back pain.  Gastrointestinal:  Negative for melena.  Genitourinary:  Positive for frequency.  Neurological:  Positive for vertigo (chronic).   Objective  Blood pressure 138/80, pulse 86, height 5\' 8"  (1.727 m), weight 202 lb (91.6 kg), SpO2 95 %.     06/21/2022    1:56 PM 12/20/2021    3:42 PM 12/20/2021    3:14 PM  Vitals with BMI  Height 5\' 8"   5\' 8"   Weight 202 lbs  205 lbs 3 oz  BMI 30.72  31.21  Systolic 138 130 02/19/2022  Diastolic 80 82 84  Pulse 86  60     Physical Exam Vitals reviewed.  Constitutional:      Appearance: He is obese.  Neck:     Vascular: No carotid bruit or JVD.  Cardiovascular:     Rate and Rhythm: Normal rate and regular rhythm.     Pulses: Normal pulses and intact distal pulses.     Heart sounds: S1 normal and S2 normal. Murmur heard.     Harsh midsystolic murmur is present with a grade of 2/6 at the upper right sternal border.     No gallop.  Pulmonary:     Effort: Pulmonary effort is normal.     Breath sounds: Normal breath sounds.  Abdominal:     General: Bowel sounds are normal.     Palpations: Abdomen is soft.  Musculoskeletal:     Right lower leg: No edema.     Left lower leg:  No edema.  Skin:    General: Skin is warm.     Capillary Refill: Capillary refill takes less than 2 seconds.    Laboratory examination:   Recent Labs    08/07/21 1255 03/21/22 1053 04/20/22 1346  NA 137 141 133*  K 4.0 4.4 4.4  CL 100 101 102  CO2 29 27 25   GLUCOSE 111* 110* 97  BUN 26* 28* 29*  CREATININE 1.54* 1.83* 1.67*  CALCIUM 9.4 9.8 9.1  GFRNONAA 45*  --   --    External labs:   Labs 06/19/2022:  Sodium 140, potassium 4.5, BUN 41, creatinine 1.63, EGFR 38 mL, LFTs normal  Urine analysis normal.  Labs 12/31/2021:  Hb 15.2/HCT 45.2, platelets 142, normal indicis.  TSH normal at 5.03.  Total cholesterol 156, triglycerides 134, HDL 43, LDL 86.  A1c 5.5%.  Allergies  Allergen Reactions   Amlodipine Swelling    Labetalol Swelling    Swollen lips    Lisinopril Cough   Penicillins Rash   Allopurinol     Other reaction(s): rash   Amoxicillin-Pot Clavulanate     Other reaction(s): swelling of the neck ad throat   Azithromycin     Other reaction(s): Unknown   Benzonatate     Other reaction(s): Unknown   Cefuroxime     Other reaction(s): facial swelling, rash   Codeine     Other reaction(s): itching, rash   Epinephrine     Other reaction(s): tachycardia   Procaine Hcl     REACTION: increased bp   Valsartan     Other reaction(s): cough   Prednisone Rash    Final Medications at End of Visit     Current Outpatient Medications:    atorvastatin (LIPITOR) 10 MG tablet, TAKE 1 TABLET(10 MG) BY MOUTH DAILY, Disp: 90 tablet, Rfl: 3   finasteride (PROSCAR) 5 MG tablet, Take 5 mg by mouth daily., Disp: , Rfl:    gabapentin (NEURONTIN) 300 MG capsule, Take 300 mg by mouth at bedtime., Disp: , Rfl:    hydrochlorothiazide (MICROZIDE) 12.5 MG capsule, TAKE 1 CAPSULE(12.5 MG) BY MOUTH DAILY, Disp: 30 capsule, Rfl: 3   HYDROcodone-acetaminophen (NORCO) 10-325 MG tablet, Take 1 tablet by mouth every 6 (six) hours as needed., Disp: , Rfl:    ipratropium (ATROVENT) 0.03 % nasal spray, Place 2 sprays into both nostrils every 12 (twelve) hours., Disp: , Rfl:    Nebivolol HCl (BYSTOLIC) 20 MG TABS, Take 1 tablet (20 mg total) by mouth daily., Disp: 100 tablet, Rfl: 3   pantoprazole (PROTONIX) 40 MG tablet, Take 40 mg by mouth daily., Disp: , Rfl:    predniSONE (STERAPRED UNI-PAK 21 TAB) 10 MG (21) TBPK tablet, 10 mg See admin instructions. follow package directions, Disp: , Rfl:    psyllium (METAMUCIL SMOOTH TEXTURE) 58.6 % powder, Take 1 packet by mouth at bedtime., Disp: 283 g, Rfl: 12   spironolactone (ALDACTONE) 25 MG tablet, Take 0.5 tablets (12.5 mg total) by mouth every morning. (Patient taking differently: Take 25 mg by mouth every morning.), Disp: 15 tablet, Rfl: 0   tamsulosin (FLOMAX) 0.4 MG CAPS  capsule, Take 0.4 mg by mouth., Disp: , Rfl:    cloNIDine (CATAPRES - DOSED IN MG/24 HR) 0.1 mg/24hr patch, Place 1 patch (0.1 mg total) onto the skin once a week., Disp: 12 patch, Rfl: 3  Radiology:   CT scan of the abdomen pelvis with contrast 08/07/2021:  1. No acute localizing process in the abdomen or pelvis. 2.  Stable prostatomegaly. 3. Aortic Atherosclerosis.  No renal artery stenosis. 4. There is a 7.8 x 5.5 cm cyst in the left kidney which is unchanged from 2018.  Cardiac Studies:   Lexiscan myoview stress test 08/25/2018:  1. Lexiscan stress test with low level exercise was performed. Stress symptoms included chest pain, relieved with rest. Resting blood pressure was 150/98 mmHg and peak effect blood pressure was 148/90 mmHg. The resting electrocardiogram demonstrated normal sinus rhythm, normal resting conduction, low voltage, possible old anteroseptal infarct, no resting arrhythmias and normal rest repolarization.  The stress electrocardiogram with low level exercise and lexiscan showed sinus rhythm, normal stress conduction, frequent PVC's and nonspecific ST-T changes.  Stress EKG is non diagnostic for ischemia as it is a pharmacologic stress.  2. The overall quality of the study is fair.  Left ventricular cavity is noted to be enlarged on the rest and stress studies.  Gated SPECT images reveal mild global decrease in myocardial thickening and wall motion.  The left ventricular ejection fraction was calculated or visually estimated to be 43%.  SPECT images reveal medium sized, moderate intensity perfusion defect seen worse on rest images. While this may be due to attenuation artifact, ischemia in this region cannot be excluded.  3. Intermediate risk study.  4. Compared to 02/24/2010, no significant change.   Echocardiogram 06/29/2021:  Left ventricle cavity is normal in size. Mild concentric hypertrophy of  the left ventricle. Normal global wall motion. Normal LV systolic function   with LVEF 50-55%. Doppler evidence of grade I (impaired) diastolic  dysfunction, normal LAP.  Left atrial cavity is mildly dilated.  Trileaflet aortic valve with Mild aortic valve leaflet calcification.  Trace aortic valve stenosis. No regurgitation.  Mild (Grade I) mitral regurgitation.  Mild tricuspid regurgitation.  Mild pulmonic regurgitation.  No evidence of pulmonary hypertension.  Previous study on 02/01/2020 noted LVEF 35-40%. severe LA dilatation.  EKG   EKG 06/21/2022: Normal sinus rhythm at rate of 73 bpm, left axis deviation, left anterior fascicular block.  Poor R progression, cannot exclude anteroseptal infarct old.  Frequent PVCs (4).  Low-voltage complexes.  Consider pulmonary disease pattern.  Compared to 08/21/2021, PVCs  Assessment     ICD-10-CM   1. Resistant hypertension  I1A.0 EKG 12-Lead    cloNIDine (CATAPRES - DOSED IN MG/24 HR) 0.1 mg/24hr patch    2. Chronic diastolic heart failure (HCC)  I50.32     3. Stage 3a chronic kidney disease (HCC)  N18.31       Medications Discontinued During This Encounter  Medication Reason   hydrALAZINE (APRESOLINE) 50 MG tablet Patient Preference   cloNIDine (CATAPRES - DOSED IN MG/24 HR) 0.1 mg/24hr patch Reorder    Meds ordered this encounter  Medications   cloNIDine (CATAPRES - DOSED IN MG/24 HR) 0.1 mg/24hr patch    Sig: Place 1 patch (0.1 mg total) onto the skin once a week.    Dispense:  12 patch    Refill:  3   Recommendations:   DEREK LAUGHTER is a 82 y.o.  Caucasian male with resistant hypertension, gout, renal stones, hyperlipidemia, truncal obesity presents here for follow-up of hypertension specifically.  He also has multiple medication allergies and intolerances.  He is enrolled in the patient in Remote Patient Monitoring and Principal Care Management as patient is high risk for hospitalization and complications from underlying medical conditions.   1. Resistant hypertension Since adding low-dose  clonidine, blood pressure under excellent control.  I reviewed his RPM data,  for now continue present medications.  He has multiple medication intolerances and allergies, in fact he has discontinued hydralazine thinking that it caused him to have sinusitis however realizes that is not related to the medication.  However his blood pressure is very well-controlled and goal blood pressure <140/80 mmHg.  Will hold off on restarting hydralazine.  2. Chronic diastolic heart failure ([place]) He has no edema, dyspnea is remained stable, no PND or orthopnea no clinical evidence of heart failure.  Suspect his heart failure was related to uncontrolled hypertension.  3. Stage 3a chronic kidney disease ([place]) Renal function has remained stable.  External labs reviewed, lipids also at goal.  I will see him back in 6 months for follow-up. He will continue with RPM.    Adrian Prows, PA-C 06/21/2022, 2:35 PM Office: [phone]

## 2022-06-21 NOTE — Telephone Encounter (Signed)
I have reviewed the data, blood pressure continues to be markedly elevated in spite of aggressive medical therapy, diet is majorly contributing but he may also have a competent of resistant hypertension.  He may be a candidate for renal denervation.

## 2022-06-21 NOTE — Telephone Encounter (Signed)
Patient home blood pressure has recently been elevated. Patient has been taking steroids for a sever sinus infection.  Average Systolic BP Level 801.65 mmHg Lowest Systolic BP Level 537 mmHg Highest Systolic BP Level 482 mmHg  Average Diastolic BP Level 70.78 mmHg Lowest Diastolic BP Level 52 mmHg Highest Diastolic BP Level 675 mmHg  06/21/2022 Thursday at 08:01 AM 169 / 108      06/20/2022 Wednesday at 09:47 PM 136 / 95      06/20/2022 Wednesday at 01:30 PM 136 / 71      06/19/2022 Tuesday at 10:34 PM 134 / 82      06/19/2022 Tuesday at 09:26 AM 168 / 108      06/19/2022 Tuesday at 09:25 AM 156 / 105      06/18/2022 Monday at 09:47 PM 168 / 96      06/18/2022 Monday at 09:45 PM 165 / 92      06/18/2022 Monday at 11:13 AM 105 / 68      06/17/2022 Sunday at 12:12 PM 140 / 90

## 2022-06-26 DIAGNOSIS — I10 Essential (primary) hypertension: Secondary | ICD-10-CM | POA: Diagnosis not present

## 2022-06-28 DIAGNOSIS — N1832 Chronic kidney disease, stage 3b: Secondary | ICD-10-CM | POA: Diagnosis not present

## 2022-06-28 DIAGNOSIS — I129 Hypertensive chronic kidney disease with stage 1 through stage 4 chronic kidney disease, or unspecified chronic kidney disease: Secondary | ICD-10-CM | POA: Diagnosis not present

## 2022-06-28 DIAGNOSIS — N400 Enlarged prostate without lower urinary tract symptoms: Secondary | ICD-10-CM | POA: Diagnosis not present

## 2022-06-28 DIAGNOSIS — I5030 Unspecified diastolic (congestive) heart failure: Secondary | ICD-10-CM | POA: Diagnosis not present

## 2022-06-28 DIAGNOSIS — I2510 Atherosclerotic heart disease of native coronary artery without angina pectoris: Secondary | ICD-10-CM | POA: Diagnosis not present

## 2022-07-19 DIAGNOSIS — I129 Hypertensive chronic kidney disease with stage 1 through stage 4 chronic kidney disease, or unspecified chronic kidney disease: Secondary | ICD-10-CM | POA: Diagnosis not present

## 2022-07-19 DIAGNOSIS — I2510 Atherosclerotic heart disease of native coronary artery without angina pectoris: Secondary | ICD-10-CM | POA: Diagnosis not present

## 2022-07-19 DIAGNOSIS — M1990 Unspecified osteoarthritis, unspecified site: Secondary | ICD-10-CM | POA: Diagnosis not present

## 2022-07-19 DIAGNOSIS — E7800 Pure hypercholesterolemia, unspecified: Secondary | ICD-10-CM | POA: Diagnosis not present

## 2022-07-25 DIAGNOSIS — H40022 Open angle with borderline findings, high risk, left eye: Secondary | ICD-10-CM | POA: Diagnosis not present

## 2022-07-25 DIAGNOSIS — H353132 Nonexudative age-related macular degeneration, bilateral, intermediate dry stage: Secondary | ICD-10-CM | POA: Diagnosis not present

## 2022-07-25 DIAGNOSIS — H02831 Dermatochalasis of right upper eyelid: Secondary | ICD-10-CM | POA: Diagnosis not present

## 2022-07-25 DIAGNOSIS — H02834 Dermatochalasis of left upper eyelid: Secondary | ICD-10-CM | POA: Diagnosis not present

## 2022-07-25 DIAGNOSIS — H5340 Unspecified visual field defects: Secondary | ICD-10-CM | POA: Diagnosis not present

## 2022-07-26 DIAGNOSIS — R3912 Poor urinary stream: Secondary | ICD-10-CM | POA: Diagnosis not present

## 2022-07-26 DIAGNOSIS — R1084 Generalized abdominal pain: Secondary | ICD-10-CM | POA: Diagnosis not present

## 2022-07-26 DIAGNOSIS — I10 Essential (primary) hypertension: Secondary | ICD-10-CM | POA: Diagnosis not present

## 2022-07-26 DIAGNOSIS — N401 Enlarged prostate with lower urinary tract symptoms: Secondary | ICD-10-CM | POA: Diagnosis not present

## 2022-07-26 DIAGNOSIS — N281 Cyst of kidney, acquired: Secondary | ICD-10-CM | POA: Diagnosis not present

## 2022-08-14 DIAGNOSIS — J343 Hypertrophy of nasal turbinates: Secondary | ICD-10-CM | POA: Diagnosis not present

## 2022-08-14 DIAGNOSIS — J310 Chronic rhinitis: Secondary | ICD-10-CM | POA: Diagnosis not present

## 2022-08-23 ENCOUNTER — Other Ambulatory Visit: Payer: Self-pay | Admitting: Cardiology

## 2022-08-23 DIAGNOSIS — I1A0 Resistant hypertension: Secondary | ICD-10-CM

## 2022-08-25 DIAGNOSIS — I10 Essential (primary) hypertension: Secondary | ICD-10-CM | POA: Diagnosis not present

## 2022-09-24 DIAGNOSIS — I10 Essential (primary) hypertension: Secondary | ICD-10-CM | POA: Diagnosis not present

## 2022-10-19 DIAGNOSIS — I2510 Atherosclerotic heart disease of native coronary artery without angina pectoris: Secondary | ICD-10-CM | POA: Diagnosis not present

## 2022-10-19 DIAGNOSIS — M1990 Unspecified osteoarthritis, unspecified site: Secondary | ICD-10-CM | POA: Diagnosis not present

## 2022-10-19 DIAGNOSIS — I129 Hypertensive chronic kidney disease with stage 1 through stage 4 chronic kidney disease, or unspecified chronic kidney disease: Secondary | ICD-10-CM | POA: Diagnosis not present

## 2022-10-19 DIAGNOSIS — E7800 Pure hypercholesterolemia, unspecified: Secondary | ICD-10-CM | POA: Diagnosis not present

## 2022-10-24 ENCOUNTER — Other Ambulatory Visit: Payer: Self-pay | Admitting: Cardiology

## 2022-10-24 DIAGNOSIS — I10 Essential (primary) hypertension: Secondary | ICD-10-CM | POA: Diagnosis not present

## 2022-10-24 DIAGNOSIS — I1A0 Resistant hypertension: Secondary | ICD-10-CM

## 2022-11-19 DIAGNOSIS — R0982 Postnasal drip: Secondary | ICD-10-CM | POA: Diagnosis not present

## 2022-11-19 DIAGNOSIS — H9203 Otalgia, bilateral: Secondary | ICD-10-CM | POA: Diagnosis not present

## 2022-11-19 DIAGNOSIS — J310 Chronic rhinitis: Secondary | ICD-10-CM | POA: Diagnosis not present

## 2022-11-19 DIAGNOSIS — J343 Hypertrophy of nasal turbinates: Secondary | ICD-10-CM | POA: Diagnosis not present

## 2022-11-23 ENCOUNTER — Other Ambulatory Visit: Payer: Self-pay

## 2022-11-23 DIAGNOSIS — I1A0 Resistant hypertension: Secondary | ICD-10-CM

## 2022-11-23 MED ORDER — NEBIVOLOL HCL 20 MG PO TABS: 1.0000 | ORAL_TABLET | Freq: Every day | ORAL | 3 refills | Status: DC

## 2022-12-10 ENCOUNTER — Other Ambulatory Visit: Payer: Self-pay | Admitting: Cardiology

## 2022-12-10 DIAGNOSIS — N1831 Chronic kidney disease, stage 3a: Secondary | ICD-10-CM

## 2022-12-10 DIAGNOSIS — I1A0 Resistant hypertension: Secondary | ICD-10-CM

## 2022-12-11 NOTE — Telephone Encounter (Signed)
Needs a refill

## 2022-12-14 ENCOUNTER — Ambulatory Visit (HOSPITAL_COMMUNITY)
Admission: RE | Admit: 2022-12-14 | Discharge: 2022-12-14 | Disposition: A | Discharge status: Home / Self Care | Payer: Medicare Other | Source: Ambulatory Visit | Attending: Orthopedic Surgery | Admitting: Orthopedic Surgery

## 2022-12-14 ENCOUNTER — Encounter (HOSPITAL_COMMUNITY): Payer: Self-pay

## 2022-12-14 ENCOUNTER — Ambulatory Visit (HOSPITAL_BASED_OUTPATIENT_CLINIC_OR_DEPARTMENT_OTHER)
Admission: RE | Admit: 2022-12-14 | Discharge: 2022-12-14 | Disposition: A | Discharge status: Home / Self Care | Payer: Medicare Other | Source: Ambulatory Visit | Attending: Orthopedic Surgery | Admitting: Orthopedic Surgery

## 2022-12-14 ENCOUNTER — Other Ambulatory Visit (HOSPITAL_COMMUNITY): Payer: Self-pay | Admitting: Orthopedic Surgery

## 2022-12-14 ENCOUNTER — Other Ambulatory Visit (HOSPITAL_COMMUNITY): Payer: Self-pay

## 2022-12-14 ENCOUNTER — Ambulatory Visit (HOSPITAL_COMMUNITY)
Admission: RE | Admit: 2022-12-14 | Discharge: 2022-12-14 | Disposition: A | Discharge status: Home / Self Care | Payer: Medicare Other | Source: Ambulatory Visit | Attending: Vascular Surgery | Admitting: Vascular Surgery

## 2022-12-14 VITALS — BP 128/72 | HR 62

## 2022-12-14 DIAGNOSIS — I824Y1 Acute embolism and thrombosis of unspecified deep veins of right proximal lower extremity: Secondary | ICD-10-CM

## 2022-12-14 DIAGNOSIS — M7989 Other specified soft tissue disorders: Secondary | ICD-10-CM

## 2022-12-14 DIAGNOSIS — M79604 Pain in right leg: Secondary | ICD-10-CM

## 2022-12-14 DIAGNOSIS — I82421 Acute embolism and thrombosis of right iliac vein: Secondary | ICD-10-CM | POA: Insufficient documentation

## 2022-12-14 DIAGNOSIS — M25561 Pain in right knee: Secondary | ICD-10-CM | POA: Diagnosis not present

## 2022-12-14 LAB — CBC
HCT: 45.7 % (ref 39.0–52.0)
Hemoglobin: 14.6 g/dL (ref 13.0–17.0)
MCH: 28.7 pg (ref 26.0–34.0)
MCHC: 31.9 g/dL (ref 30.0–36.0)
MCV: 90 fL (ref 80.0–100.0)
Platelets: 122 10*3/uL — ABNORMAL LOW (ref 150–400)
RBC: 5.08 MIL/uL (ref 4.22–5.81)
RDW: 14.3 % (ref 11.5–15.5)
WBC: 13.4 10*3/uL — ABNORMAL HIGH (ref 4.0–10.5)
nRBC: 0 % (ref 0.0–0.2)

## 2022-12-14 LAB — COMPREHENSIVE METABOLIC PANEL
ALT: 9 U/L (ref 0–44)
AST: 14 U/L — ABNORMAL LOW (ref 15–41)
Albumin: 3.9 g/dL (ref 3.5–5.0)
Alkaline Phosphatase: 54 U/L (ref 38–126)
Anion gap: 10 (ref 5–15)
BUN: 32 mg/dL — ABNORMAL HIGH (ref 8–23)
CO2: 26 mmol/L (ref 22–32)
Calcium: 8.9 mg/dL (ref 8.9–10.3)
Chloride: 102 mmol/L (ref 98–111)
Creatinine, Ser: 1.84 mg/dL — ABNORMAL HIGH (ref 0.61–1.24)
GFR, Estimated: 36 mL/min — ABNORMAL LOW (ref >60)
Glucose, Bld: 133 mg/dL — ABNORMAL HIGH (ref 70–99)
Potassium: 4.4 mmol/L (ref 3.5–5.1)
Sodium: 138 mmol/L (ref 135–145)
Total Bilirubin: 1.6 mg/dL — ABNORMAL HIGH (ref 0.3–1.2)
Total Protein: 7 g/dL (ref 6.5–8.1)

## 2022-12-14 MED ORDER — APIXABAN 5 MG PO TABS: 5.0000 mg | ORAL_TABLET | Freq: Two times a day (BID) | ORAL | 2 refills | Status: DC

## 2022-12-14 MED ORDER — APIXABAN (ELIQUIS) VTE STARTER PACK (10MG AND 5MG)
ORAL_TABLET | ORAL | 0 refills | Status: DC
Start: 2022-12-14 — End: 2023-01-14
  Filled 2022-12-14: qty 74, 30d supply, fill #0

## 2022-12-14 NOTE — Progress Notes (Addendum)
DVT Clinic Note  Name: Keith [name]     MRN: [id number]     DOB: 06/21/1940     Sex: male  PCP: Keith Reichmann, DO  Today's Visit: Visit Information: Initial Visit  Referred to DVT Clinic by: Dr. Eulah [name])  Referred to CPP by: Dr. Edilia [name] Reason for referral:  Chief Complaint  Patient presents with   DVT   HISTORY OF PRESENT ILLNESS: [name] is a 82 y.o. male with PMH HTN, HLD, CAD, CKD, gout, GERD, bilateral knee replacements ~20 years ago, who presents after diagnosis of DVT for medication management. RLE swelling and tenderness started on Sunday 12/09/22. He saw Dr. Eulah [name] today who ordered an [place] to rule out DVT. His ultrasound today showed acute, occlusive iliofemoral DVT, and he was referred to the DVT Clinic for treatment.  Today, patient reports that within the last few weeks he started having swelling in his right foot that would come and go, but this past Sunday his whole leg was swollen. Reports he sits for most of the day. His activity level has been lower since around Christmas. Endorses "laziness" as the reason for not moving anymore. No recent injuries, surgeries, illnesses, prolonged travel. He denies SOB, CP, palpitations. Does have some lightheadedness which is normal for him, per the patient.   Positive Thrombotic Risk Factors: Older Age, Other (comment) (prolonged immobility (sits for majority of the day, recent change as of ~6 months ago)) Bleeding Risk Factors: Age >65 years  Negative Thrombotic Risk Factors: Previous VTE, Recent surgery (within 3 months), Recent trauma (within 3 months), Central venous catheterization, Pregnancy, Testosterone therapy, Estrogen therapy, Active cancer, Obesity, Bed rest >72 hours within 3 months, Paralysis, paresis, or recent plaster cast immobilization of lower extremity, Recent admission to hospital with acute illness (within 3 months), Recent cesarean section (within 3 months), Within 6 weeks postpartum,  Erythropoiesis-stimulating agent, Recent COVID diagnosis (within 3 months), Known thrombophilic condition, Non-malignant, chronic inflammatory condition, Smoking, Sedentary journey lasting >8 hours within 4 weeks  Rx Insurance Coverage: Medicare Rx Affordability: Eliquis was filled for $0 today using one-time savings card. Refills are showing they will be $128/month which the patient says is affordable.  Preferred Pharmacy: Filled starter pack at [facility] pharmacy during visit today. Refills will be sent to his preferred [facility].   Past Medical History:  Diagnosis Date   Abnormal nuclear stress test 02/24/2010   Nuclear stress 02/24/10 Infero lateral ischemia     Allergic rhinitis, cause unspecified    skin test pos 02-23-2008   Angina pectoris (HCC)    CAD (coronary artery disease)    CKD (chronic kidney disease) stage 3, GFR 30-59 ml/min (HCC) 07/23/2018   Dyspnea on exertion 07/23/2018   GERD (gastroesophageal reflux disease)    Hypercholesteremia 07/23/2018   Kidney stone    Migraine    Nephrolithiasis    Resistant hypertension 07/23/2018   TIA (transient ischemic attack)     Past Surgical History:  Procedure Laterality Date   bilateral tkr     EXTRACORPOREAL SHOCK WAVE LITHOTRIPSY Right 03/02/2019   Procedure: EXTRACORPOREAL SHOCK WAVE LITHOTRIPSY (ESWL);  Surgeon: Keith [name];  Location: [facility];  Service: Urology;  Laterality: Right;   HERNIA REPAIR     reset nasal fracture     TONSILLECTOMY      Social History   Socioeconomic History   Marital status: Married    Spouse name: Not on file   Number of children:  1   Years of education: Not on file   Highest education level: Not on file  Occupational History   Occupation: retired   Occupation: works part Manufacturing engineer  Tobacco Use   Smoking status: Never   Smokeless tobacco: Never  Vaping Use   Vaping status: Never Used  Substance and Sexual Activity   Alcohol use: No    Alcohol/week: 0.0  standard drinks of alcohol    Comment: Pt used to have alcohol problems.   Drug use: No   Sexual activity: Not on file  Other Topics Concern   Not on file  Social History Narrative   Not on file   Social Determinants of Health   Financial Resource Strain: Not on file  Food Insecurity: Not on file  Transportation Needs: Not on file  Physical Activity: Not on file  Stress: Not on file  Social Connections: Not on file  Intimate Partner Violence: Not on file    Family History  Problem Relation Age of Onset   Allergy (severe) Mother    Heart attack Father    Stroke Father    COPD Brother    Heart attack Brother    Tuberculosis Other        grandmother    Allergies as of 12/14/2022 - Review Complete 12/14/2022  Allergen Reaction Noted   Amlodipine Swelling 05/28/2018   Labetalol Swelling 05/28/2018   Lisinopril Cough 05/28/2018   Penicillins Rash 05/28/2018   Allopurinol  12/22/2020   Amoxicillin-pot clavulanate  12/22/2020   Azithromycin  12/22/2020   Benzonatate  12/22/2020   Cefuroxime  12/22/2020   Codeine  12/22/2020   Epinephrine  12/22/2020   Procaine hcl     Valsartan  12/22/2020   Prednisone Rash 11/22/2020    Current Outpatient Medications on File Prior to Encounter  Medication Sig Dispense Refill   atorvastatin (LIPITOR) 10 MG tablet TAKE 1 TABLET(10 MG) BY MOUTH DAILY 90 tablet 3   cloNIDine (CATAPRES - DOSED IN MG/24 HR) 0.1 mg/24hr patch Place 1 patch (0.1 mg total) onto the skin once a week. 12 patch 3   finasteride (PROSCAR) 5 MG tablet Take 5 mg by mouth daily.     fluticasone (FLONASE) 50 MCG/ACT nasal spray Place 2 sprays into both nostrils daily.     hydrALAZINE (APRESOLINE) 50 MG tablet Take 50 mg by mouth 3 (three) times daily.     hydrochlorothiazide (MICROZIDE) 12.5 MG capsule TAKE 1 CAPSULE(12.5 MG) BY MOUTH DAILY 90 capsule 3   HYDROcodone-acetaminophen (NORCO) 10-325 MG tablet Take 1 tablet by mouth every 6 (six) hours as needed.      ipratropium (ATROVENT) 0.03 % nasal spray Place 2 sprays into both nostrils every 12 (twelve) hours.     Nebivolol HCl (BYSTOLIC) 20 MG TABS Take 1 tablet (20 mg total) by mouth daily. 100 tablet 3   pantoprazole (PROTONIX) 40 MG tablet Take 40 mg by mouth daily.     spironolactone (ALDACTONE) 25 MG tablet TAKE 1/2 TABLET(12.5 MG) BY MOUTH EVERY MORNING (Patient taking differently: Take 25 mg by mouth daily.) 45 tablet 3   tamsulosin (FLOMAX) 0.4 MG CAPS capsule Take 0.4 mg by mouth.     No current facility-administered medications on file prior to encounter.   REVIEW OF SYSTEMS:  Review of Systems  Respiratory:  Negative for shortness of breath.   Cardiovascular:  Negative for chest pain and palpitations.  Musculoskeletal:  Positive for myalgias.  Neurological:  Negative for dizziness and tingling.  PHYSICAL EXAMINATION:  Vitals:   12/14/22 1318  BP: 128/72  Pulse: 62  SpO2: 100%   Physical Exam Vitals reviewed.  Cardiovascular:     Rate and Rhythm: Normal rate.  Pulmonary:     Effort: Pulmonary effort is normal.  Musculoskeletal:        General: Tenderness present.     Right lower leg: Edema present.     Left lower leg: No edema.  Skin:    Findings: Erythema present. No bruising.  Psychiatric:        Mood and Affect: Mood normal.        Behavior: Behavior normal.        Thought Content: Thought content normal.   Villalta Score for Post-Thrombotic Syndrome: Pain: Mild Cramps: Absent Heaviness: Mild Paresthesia: Moderate Pruritus: Absent Pretibial Edema: Severe Skin Induration: Absent Hyperpigmentation: Absent Redness: Mild Venous Ectasia: Absent Pain on calf compression: Moderate Villalta Preliminary Score: 10 Is venous ulcer present?: No If venous ulcer is present and score is <15, then 15 points total are assigned: Absent Villalta Total Score: 10  LABS:  CBC     Component Value Date/Time   WBC 14.0 (H) 08/07/2021 1255   RBC 5.36 08/07/2021 1255   HGB  16.1 08/07/2021 1255   HCT 48.7 08/07/2021 1255   PLT 153 08/07/2021 1255   MCV 90.9 08/07/2021 1255   MCH 30.0 08/07/2021 1255   MCHC 33.1 08/07/2021 1255   RDW 14.0 08/07/2021 1255   LYMPHSABS 1.7 08/07/2021 1255   MONOABS 1.0 08/07/2021 1255   EOSABS 0.3 08/07/2021 1255   BASOSABS 0.1 08/07/2021 1255    Hepatic Function      Component Value Date/Time   PROT 7.3 08/07/2021 1255   ALBUMIN 4.2 08/07/2021 1255   AST 16 08/07/2021 1255   ALT 13 08/07/2021 1255   ALKPHOS 52 08/07/2021 1255   BILITOT 0.7 08/07/2021 1255    Renal Function   Lab Results  Component Value Date   CREATININE 1.67 (H) 04/20/2022   CREATININE 1.83 (H) 03/21/2022   CREATININE 1.54 (H) 08/07/2021    CrCl cannot be calculated (Patient's most recent lab result is older than the maximum 21 days allowed.).   [facility] Vascular Lab Studies:  12/14/22 VAS [place] LOWER EXTREMITY VENOUS (DVT)RIGHT  Summary:  RIGHT:  - Findings consistent with acute deep vein thrombosis involving the right  common femoral vein, SF junction, right femoral vein, right proximal  profunda vein, right popliteal vein, and external iliac vein.  - Findings consistent with acute superficial vein thrombosis involving the  right great saphenous vein.  - No cystic structure found in the popliteal fossa.   LEFT:  - No evidence of common femoral vein obstruction.     12/14/22 VAS [place] IVC/ILIAC RIGHT (VENOUS ONLY) Summary:  IVC/Iliac: There is no evidence of thrombus involving the IVC. There is  evidence of acute thrombus involving the right common iliac vein. There is  evidence of acute thrombus involving the right external iliac vein.   ASSESSMENT: Location of DVT: Right iliac vein, Right common femoral vein, Right femoral vein, Right popliteal vein, Right superficial vein Cause of DVT: provoked by a transient risk factor - decreased mobility for the past few months. He primary sits all day. Will consider this a transient risk factor pending his  ability to increase his activity.   Due to the extensive iliofemoral DVT, I consulted with Dr. Edilia [name] who came to evaluate the patient. Please see his note for additional details. Patient's  swelling extends the entire length of his leg, but he only has pain with walking and when he applies pressure to the leg. After discussion of treatment options for iliofemoral DVT, patient declines vascular intervention for DVT at this time. Will plan for anticoagulation, compression, and elevation, and he will have a follow up ultrasound and appointment at [facility] in 3 months, which Dr. Edilia [name] will arrange.   Due to patient's kidney function, will start anticoagulation with Eliquis. He has not had recent labs so we checked a CBC and CMET today. Eliquis was filled for $0 today using one-time savings card. Refills are showing they will be $128/month which the patient says is affordable.   PLAN: -Start apixaban (Eliquis) 10 mg twice daily for 7 days followed by 5 mg twice daily. -Expected duration of therapy: At least 3 months. Therapy started on 12/14/22. -Patient educated on purpose, proper use and potential adverse effects of apixaban (Eliquis). -Discussed importance of taking medication around the same time every day. -Advised patient of medications to avoid (NSAIDs, aspirin doses >100 mg daily). -Educated that Tylenol (acetaminophen) is the preferred analgesic to lower the risk of bleeding. -Advised patient to alert all providers of anticoagulation therapy prior to starting a new medication or having a procedure. -Emphasized importance of monitoring for signs and symptoms of bleeding (abnormal bruising, prolonged bleeding, nose bleeds, bleeding from gums, discolored urine, black tarry stools). -Educated patient to present to the ED if emergent signs and symptoms of new thrombosis occur. -Counseled patient to wear compression stockings daily, removing at night. Educated patient on proper way to elevate his legs.    Follow up: DVT Clinic visit in 1 month to assess tolerability and adherence to Eliquis. 3 months for follow up ultrasound and appt at [facility].   Pervis Hocking, PharmD, BCACP, CPP Deep Vein Thrombosis Clinic Clinical Pharmacist Practitioner Office: [phone]  I have interviewed the patient and examined the patient. I agree with the findings as documented above.  The patient has an extensive right lower extremity DVT which extends up into the common iliac vein and external iliac vein.  I had a long discussion with him about the options of anticoagulation alone versus mechanical thrombectomy.  Given the extent of the clot I explained that we can lower his risk of long-term post thrombotic complications by addressing the clot with mechanical thrombectomy.  We discussed the small risk involved.  We also discussed anticoagulation alone.  I explained that he could go home on oral anticoagulation and return Monday for the procedure.  The patient however feels strongly about not having the procedure.  Given his age I do not think this is unreasonable.  I will arrange for a follow-up in 3 months in our office with a follow-up duplex at that time.  We have discussed importance of leg elevation and compression therapy.  Cari Caraway, [name]

## 2022-12-14 NOTE — Patient Instructions (Addendum)
-  Start apixaban (Eliquis) 10 mg twice daily for 7 days followed by 5 mg twice daily. -Your refills have been [facility] to your [facility]. You may need to call the pharmacy to ask them to fill this when you start to run low on your current supply.  -It is important to take your medication around the same time every day.  -Avoid NSAIDs like ibuprofen (Advil, Motrin) and naproxen (Aleve) as well as aspirin doses over 100 mg daily. -Tylenol (acetaminophen) is the preferred over the counter pain medication to lower the risk of bleeding. -Be sure to alert all of your health care providers that you are taking an anticoagulant prior to starting a new medication or having a procedure. -Monitor for signs and symptoms of bleeding (abnormal bruising, prolonged bleeding, nose bleeds, bleeding from gums, discolored urine, black tarry stools). If you have fallen and hit your head OR if your bleeding is severe or not stopping, seek emergency care.  -Go to the emergency room if emergent signs and symptoms of new clot occur (new or worse swelling and pain in an arm or leg, shortness of breath, chest pain, fast or irregular heartbeats, lightheadedness, dizziness, fainting, coughing up blood) or if you experience a significant color change (pale or blue) in the extremity that has the DVT.  -We recommend you wear compression stockings (20-30 mmHg) as long as you are having swelling or pain. Be sure to purchase the correct size and take them off at night.   Your next visit is on Monday, August 26th at [facility].  [facility] [address] [place], [place], [place] [address] Enter the hospital through Entrance C off [address] and pull up to the Heart [facility] Vascular Center entrance to the free valet parking.  Check in for your appointment at the [facility] Vascular Center.   In 3 months, you'll have another ultrasound and appointment with vascular surgery at their [facility] office.   If you have any  questions or need to reschedule an appointment, please call [phone] [facility].  If you are having an emergency, call 911 or present to the nearest emergency room.   What is a DVT?  -Deep vein thrombosis (DVT) is a condition in which a blood clot forms in a vein of the deep venous system which can occur in the lower leg, thigh, pelvis, arm, or neck. This condition is serious and can be life-threatening if the clot travels to the arteries of the lungs and causing a blockage (pulmonary embolism, PE). A DVT can also damage veins in the leg, which can lead to long-term venous disease, leg pain, swelling, discoloration, and ulcers or sores (post-thrombotic syndrome).  -Treatment may include taking an anticoagulant medication to prevent more clots from forming and the current clot from growing, wearing compression stockings, and/or surgical procedures to remove or dissolve the clot.

## 2022-12-14 NOTE — Progress Notes (Signed)
RLE venous duplex & IVC/iliac duplex exams have been completed.  Attempted to call report to number provided but it was out of service.  Patient sent to DVT clinic for further evaluation.   Results can be found under chart review under CV PROC. 12/14/2022 4:06 PM Shronda Boeh RVT, RDMS

## 2022-12-18 ENCOUNTER — Ambulatory Visit: Payer: Medicare Other | Admitting: Cardiology

## 2022-12-19 ENCOUNTER — Ambulatory Visit: Payer: Medicare Other | Admitting: Cardiology

## 2023-01-09 DIAGNOSIS — N1832 Chronic kidney disease, stage 3b: Secondary | ICD-10-CM | POA: Diagnosis not present

## 2023-01-09 DIAGNOSIS — I2510 Atherosclerotic heart disease of native coronary artery without angina pectoris: Secondary | ICD-10-CM | POA: Diagnosis not present

## 2023-01-09 DIAGNOSIS — I129 Hypertensive chronic kidney disease with stage 1 through stage 4 chronic kidney disease, or unspecified chronic kidney disease: Secondary | ICD-10-CM | POA: Diagnosis not present

## 2023-01-09 DIAGNOSIS — I5030 Unspecified diastolic (congestive) heart failure: Secondary | ICD-10-CM | POA: Diagnosis not present

## 2023-01-14 ENCOUNTER — Ambulatory Visit (HOSPITAL_COMMUNITY)
Admission: RE | Admit: 2023-01-14 | Discharge: 2023-01-14 | Disposition: A | Discharge status: Home / Self Care | Payer: Medicare Other | Source: Ambulatory Visit | Attending: Vascular Surgery | Admitting: Vascular Surgery

## 2023-01-14 ENCOUNTER — Encounter (HOSPITAL_COMMUNITY): Payer: Self-pay

## 2023-01-14 VITALS — BP 145/101 | HR 56

## 2023-01-14 DIAGNOSIS — I82421 Acute embolism and thrombosis of right iliac vein: Secondary | ICD-10-CM | POA: Diagnosis not present

## 2023-01-14 NOTE — Progress Notes (Signed)
DVT Clinic Note  Name: Keith [name]     MRN: [id number]     DOB: 10/21/1940     Sex: male  PCP: Irena Reichmann, DO  Today's Visit: Visit Information: Follow Up Visit  Referred to DVT Clinic by: Orthopedic Surgery - Dr. Eulah Pont Referred to CPP by: Dr. Randie Heinz Reason for referral:  Chief Complaint  Patient presents with   Med Management - DVT   HISTORY OF PRESENT ILLNESS: Keith [name] is a 82 y.o. male with PMH HTN, HLD, CAD, CKD, gout, GERD, bilateral knee replacements ~20 years ago, who presents for follow up medication management for DVT. RLE swelling and tenderness started on Sunday 12/09/22. He saw Dr. Eulah Pont 12/14/22 who ordered an [place] to rule out DVT which showed acute, occlusive iliofemoral DVT. Last seen in DVT Clinic 12/14/22 at which time Eliquis was started. DVT felt to be related to recent decreased mobility. Dr. Edilia Bo evaluated the patient for possible thrombectomy given occlusive iliofemoral DVT but the patient chose to pursue anticoagulation alone.   Today patient is accompanied by his wife. He reports that he is no longer in any pain and his swelling has improved. Denies abnormal bleeding or bruising. Denies missed doses of Eliquis - he takes it at 10am and 10pm daily. Denies wearing compression stockings as he was unable to get them on. He has been elevating his legs multiple times per day which has helped improve his swelling. He increased his physical activity. He now walks to his mailbox (260 feet driveway) and walks on the treadmill 2-3 times per day. He says he isn't able to do it for long so he does it multiple times a day. In the last week he has developed some itchiness on his arms and legs which appear to be bug bites. He has also developed bilateral big toe pain which he thinks is gout. He sees his PCP on Wednesday of this week for follow up.   Positive Thrombotic Risk Factors: Older Age, Other (comment) (prolonged immobility (sits for majority of the day, recent change as  of ~6 months ago)) Bleeding Risk Factors: Anticoagulant therapy, Age >65 years  Negative Thrombotic Risk Factors: Previous VTE, Recent surgery (within 3 months), Recent trauma (within 3 months), Recent admission to hospital with acute illness (within 3 months), Paralysis, paresis, or recent plaster cast immobilization of lower extremity, Central venous catheterization, Bed rest >72 hours within 3 months, Sedentary journey lasting >8 hours within 4 weeks, Pregnancy, Within 6 weeks postpartum, Recent cesarean section (within 3 months), Estrogen therapy, Testosterone therapy, Erythropoiesis-stimulating agent, Recent COVID diagnosis (within 3 months), Active cancer, Non-malignant, chronic inflammatory condition, Known thrombophilic condition, Smoking, Obesity  Rx Insurance Coverage: Medicare Rx Affordability: Eliquis was filled for $0 at his last visit using one-time savings card. Refills are $128/month which the patient says is affordable.  Rx Assistance Provided: Free 30-day trial card Preferred Pharmacy: Refills sent to [facility]'s preferred [facility].  Past Medical History:  Diagnosis Date   Abnormal nuclear stress test 02/24/2010   Nuclear stress 02/24/10 Infero lateral ischemia     Allergic rhinitis, cause unspecified    skin test pos 02-23-2008   Angina pectoris (HCC)    CAD (coronary artery disease)    CKD (chronic kidney disease) stage 3, GFR 30-59 ml/min (HCC) 07/23/2018   Dyspnea on exertion 07/23/2018   GERD (gastroesophageal reflux disease)    Hypercholesteremia 07/23/2018   Kidney stone    Migraine    Nephrolithiasis    Resistant hypertension 07/23/2018  TIA (transient ischemic attack)     Past Surgical History:  Procedure Laterality Date   bilateral tkr     EXTRACORPOREAL SHOCK WAVE LITHOTRIPSY Right 03/02/2019   Procedure: EXTRACORPOREAL SHOCK WAVE LITHOTRIPSY (ESWL);  Surgeon: Crista Elliot, MD;  Location: [facility];  Service: Urology;  Laterality: Right;   HERNIA REPAIR      reset nasal fracture     TONSILLECTOMY      Social History   Socioeconomic History   Marital status: Married    Spouse name: Not on file   Number of children: 1   Years of education: Not on file   Highest education level: Not on file  Occupational History   Occupation: retired   Occupation: works part Manufacturing engineer  Tobacco Use   Smoking status: Never   Smokeless tobacco: Never  Vaping Use   Vaping status: Never Used  Substance and Sexual Activity   Alcohol use: No    Alcohol/week: 0.0 standard drinks of alcohol    Comment: Pt used to have alcohol problems.   Drug use: No   Sexual activity: Not on file  Other Topics Concern   Not on file  Social History Narrative   Not on file   Social Determinants of Health   Financial Resource Strain: Not on file  Food Insecurity: Not on file  Transportation Needs: Not on file  Physical Activity: Not on file  Stress: Not on file  Social Connections: Not on file  Intimate Partner Violence: Not on file    Family History  Problem Relation Age of Onset   Allergy (severe) Mother    Heart attack Father    Stroke Father    COPD Brother    Heart attack Brother    Tuberculosis Other        grandmother    Allergies as of 01/14/2023 - Review Complete 01/14/2023  Allergen Reaction Noted   Amlodipine Swelling 05/28/2018   Labetalol Swelling 05/28/2018   Lisinopril Cough 05/28/2018   Penicillins Rash 05/28/2018   Allopurinol  12/22/2020   Amoxicillin-pot clavulanate  12/22/2020   Azithromycin  12/22/2020   Benzonatate  12/22/2020   Cefuroxime  12/22/2020   Codeine  12/22/2020   Epinephrine  12/22/2020   Procaine hcl     Valsartan  12/22/2020   Prednisone Rash 11/22/2020    Current Outpatient Medications on File Prior to Encounter  Medication Sig Dispense Refill   apixaban (ELIQUIS) 5 MG TABS tablet Take 1 tablet (5 mg total) by mouth 2 (two) times daily. Start taking after completion of starter pack. 60 tablet 2    atorvastatin (LIPITOR) 10 MG tablet TAKE 1 TABLET(10 MG) BY MOUTH DAILY 90 tablet 3   cloNIDine (CATAPRES - DOSED IN MG/24 HR) 0.1 mg/24hr patch Place 1 patch (0.1 mg total) onto the skin once a week. 12 patch 3   finasteride (PROSCAR) 5 MG tablet Take 5 mg by mouth daily.     fluticasone (FLONASE) 50 MCG/ACT nasal spray Place 2 sprays into both nostrils daily.     hydrALAZINE (APRESOLINE) 50 MG tablet Take 50 mg by mouth 3 (three) times daily.     hydrochlorothiazide (MICROZIDE) 12.5 MG capsule TAKE 1 CAPSULE(12.5 MG) BY MOUTH DAILY 90 capsule 3   HYDROcodone-acetaminophen (NORCO) 10-325 MG tablet Take 1 tablet by mouth every 6 (six) hours as needed.     ipratropium (ATROVENT) 0.03 % nasal spray Place 2 sprays into both nostrils every 12 (twelve) hours.  Nebivolol HCl (BYSTOLIC) 20 MG TABS Take 1 tablet (20 mg total) by mouth daily. 100 tablet 3   pantoprazole (PROTONIX) 40 MG tablet Take 40 mg by mouth daily.     spironolactone (ALDACTONE) 25 MG tablet TAKE 1/2 TABLET(12.5 MG) BY MOUTH EVERY MORNING (Patient taking differently: Take 25 mg by mouth daily.) 45 tablet 3   tamsulosin (FLOMAX) 0.4 MG CAPS capsule Take 0.4 mg by mouth.     No current facility-administered medications on file prior to encounter.   REVIEW OF SYSTEMS:  Review of Systems  Respiratory:  Negative for shortness of breath.   Cardiovascular:  Negative for chest pain, palpitations and leg swelling.  Musculoskeletal:  Negative for myalgias.  Neurological:  Negative for dizziness and tingling.  All other systems reviewed and are negative.  PHYSICAL EXAMINATION:  Vitals:   01/14/23 1422  BP: (!) 145/101  Pulse: (!) 56  SpO2: 98%   Physical Exam Vitals reviewed.  Cardiovascular:     Rate and Rhythm: Bradycardia present.  Pulmonary:     Effort: Pulmonary effort is normal.  Musculoskeletal:        General: No tenderness.     Right lower leg: Edema (1+) present.     Left lower leg: Edema (trace) present.   Skin:    Findings: No bruising or erythema.  Psychiatric:        Mood and Affect: Mood normal.        Behavior: Behavior normal.        Thought Content: Thought content normal.   Villalta Score for Post-Thrombotic Syndrome: Pain: Mild Cramps: Mild Heaviness: Absent Paresthesia: Absent Pruritus: Mild Pretibial Edema: Mild Skin Induration: Absent Hyperpigmentation: Absent Redness: Absent Venous Ectasia: Absent Pain on calf compression: Absent Villalta Preliminary Score: 4 Is venous ulcer present?: No If venous ulcer is present and score is <15, then 15 points total are assigned: Absent Villalta Total Score: 4  LABS:  CBC     Component Value Date/Time   WBC 13.4 (H) 12/14/2022 1325   RBC 5.08 12/14/2022 1325   HGB 14.6 12/14/2022 1325   HCT 45.7 12/14/2022 1325   PLT 122 (L) 12/14/2022 1325   MCV 90.0 12/14/2022 1325   MCH 28.7 12/14/2022 1325   MCHC 31.9 12/14/2022 1325   RDW 14.3 12/14/2022 1325   LYMPHSABS 1.7 08/07/2021 1255   MONOABS 1.0 08/07/2021 1255   EOSABS 0.3 08/07/2021 1255   BASOSABS 0.1 08/07/2021 1255    Hepatic Function      Component Value Date/Time   PROT 7.0 12/14/2022 1325   ALBUMIN 3.9 12/14/2022 1325   AST 14 (L) 12/14/2022 1325   ALT 9 12/14/2022 1325   ALKPHOS 54 12/14/2022 1325   BILITOT 1.6 (H) 12/14/2022 1325    Renal Function   Lab Results  Component Value Date   CREATININE 1.84 (H) 12/14/2022   CREATININE 1.67 (H) 04/20/2022   CREATININE 1.83 (H) 03/21/2022    CrCl cannot be calculated (Patient's most recent lab result is older than the maximum 21 days allowed.).   [facility] Lab Studies:  12/14/22 VAS [place] LOWER EXTREMITY VENOUS (DVT)RIGHT  Summary:  RIGHT:  - Findings consistent with acute deep vein thrombosis involving the right  common femoral vein, SF junction, right femoral vein, right proximal  profunda vein, right popliteal vein, and external iliac vein.  - Findings consistent with acute superficial vein  thrombosis involving the  right great saphenous vein.  - No cystic structure found in the popliteal fossa.  LEFT:  - No evidence of common femoral vein obstruction.     12/14/22 VAS [place] IVC/ILIAC RIGHT (VENOUS ONLY) Summary:  IVC/Iliac: There is no evidence of thrombus involving the IVC. There is  evidence of acute thrombus involving the right common iliac vein. There is  evidence of acute thrombus involving the right external iliac vein.   ASSESSMENT: Location of DVT: Right iliac vein, Right common femoral vein, Right femoral vein, Right popliteal vein, Right superficial vein Cause of DVT: provoked by a transient risk factor - decreased mobility (sits for the majority of the day) which is a change within the past few months. Since his last visit, he has significantly increased his activity. He doesn't sit for long periods of time and is walking to his mailbox and walking on a treadmill 2-3 times per day.   Given the patient's extensive iliofemoral DVT, Dr. Edilia Bo evaluated the patient at his last visit to discuss anticoagulation alone versus mechanical thrombectomy, and the patient chose anticoagulation alone. Per Dr. Edilia Bo, the plan is to anticoagulate for 3 months then be seen at [facility] for repeat imaging and will determine if he can come off anticoagulation at that time. His symptoms of pain and swelling have improved from his last visit. He is tolerating Eliquis well with no bleeding concerns. No medication adherence or access issues at this time.   PLAN: -Continue apixaban (Eliquis) 5 mg twice daily. -Expected duration of therapy: At least 3 months. Therapy started on 12/14/22. -Patient educated on purpose, proper use and potential adverse effects of apixaban (Eliquis). -Discussed importance of taking medication around the same time every day. -Advised patient of medications to avoid (NSAIDs, aspirin doses >100 mg daily). -Educated that Tylenol (acetaminophen) is the preferred analgesic  to lower the risk of bleeding. -Advised patient to alert all providers of anticoagulation therapy prior to starting a new medication or having a procedure. -Emphasized importance of monitoring for signs and symptoms of bleeding (abnormal bruising, prolonged bleeding, nose bleeds, bleeding from gums, discolored urine, black tarry stools). -Educated patient to present to the ED if emergent signs and symptoms of new thrombosis occur. -Counseled patient to wear compression stockings daily, removing at night. Continue elevation.  -Continue to maintain increased physical activity throughout the day. Decrease periods of prolonged sitting.   Follow up: Repeat imaging and visit at [facility] in October.  Pervis Hocking, PharmD, P[place]y [place], CPP [facility] Clinical Pharmacist Practitioner Office: [phone]

## 2023-01-14 NOTE — Patient Instructions (Signed)
-  Continue apixaban (Eliquis) 5 mg twice daily. -You still have refills available at your [facility].  -Your next visit will be at the [facility] office in October.  -It is important to take your medication around the same time every day.  -Avoid NSAIDs like ibuprofen (Advil, Motrin) and naproxen (Aleve) as well as aspirin doses over 100 mg daily. -Tylenol (acetaminophen) is the preferred over the counter pain medication to lower the risk of bleeding. -Be sure to alert all of your health care providers that you are taking an anticoagulant prior to starting a new medication or having a procedure. -Monitor for signs and symptoms of bleeding (abnormal bruising, prolonged bleeding, nose bleeds, bleeding from gums, discolored urine, black tarry stools). If you have fallen and hit your head OR if your bleeding is severe or not stopping, seek emergency care.  -Go to the emergency room if emergent signs and symptoms of new clot occur (new or worse swelling and pain in an arm or leg, shortness of breath, chest pain, fast or irregular heartbeats, lightheadedness, dizziness, fainting, coughing up blood) or if you experience a significant color change (pale or blue) in the extremity that has the DVT.  -We recommend you wear compression stockings (20-30 mmHg > 15-20 mmHg) as long as you are having swelling or pain. Be sure to purchase the correct size and take them off at night. Keep elevating your legs.   If you have any questions or need to reschedule an appointment, please call [phone] [facility].  If you are having an emergency, call 911 or present to the nearest emergency room.   What is a DVT?  -Deep vein thrombosis (DVT) is a condition in which a blood clot forms in a vein of the deep venous system which can occur in the lower leg, thigh, pelvis, arm, or neck. This condition is serious and can be life-threatening if the clot travels to the arteries of the lungs and causing a blockage (pulmonary embolism,  PE). A DVT can also damage veins in the leg, which can lead to long-term venous disease, leg pain, swelling, discoloration, and ulcers or sores (post-thrombotic syndrome).  -Treatment may include taking an anticoagulant medication to prevent more clots from forming and the current clot from growing, wearing compression stockings, and/or surgical procedures to remove or dissolve the clot.

## 2023-01-16 DIAGNOSIS — E7800 Pure hypercholesterolemia, unspecified: Secondary | ICD-10-CM | POA: Diagnosis not present

## 2023-01-16 DIAGNOSIS — Z0000 Encounter for general adult medical examination without abnormal findings: Secondary | ICD-10-CM | POA: Diagnosis not present

## 2023-01-16 DIAGNOSIS — M109 Gout, unspecified: Secondary | ICD-10-CM | POA: Diagnosis not present

## 2023-01-16 DIAGNOSIS — I129 Hypertensive chronic kidney disease with stage 1 through stage 4 chronic kidney disease, or unspecified chronic kidney disease: Secondary | ICD-10-CM | POA: Diagnosis not present

## 2023-01-16 DIAGNOSIS — M1990 Unspecified osteoarthritis, unspecified site: Secondary | ICD-10-CM | POA: Diagnosis not present

## 2023-01-16 DIAGNOSIS — H6993 Unspecified Eustachian tube disorder, bilateral: Secondary | ICD-10-CM | POA: Diagnosis not present

## 2023-01-16 DIAGNOSIS — M5136 Other intervertebral disc degeneration, lumbar region: Secondary | ICD-10-CM | POA: Diagnosis not present

## 2023-01-16 DIAGNOSIS — I82401 Acute embolism and thrombosis of unspecified deep veins of right lower extremity: Secondary | ICD-10-CM | POA: Diagnosis not present

## 2023-01-16 DIAGNOSIS — I5032 Chronic diastolic (congestive) heart failure: Secondary | ICD-10-CM | POA: Diagnosis not present

## 2023-01-16 DIAGNOSIS — M542 Cervicalgia: Secondary | ICD-10-CM | POA: Diagnosis not present

## 2023-01-16 DIAGNOSIS — I2510 Atherosclerotic heart disease of native coronary artery without angina pectoris: Secondary | ICD-10-CM | POA: Diagnosis not present

## 2023-01-16 DIAGNOSIS — N1832 Chronic kidney disease, stage 3b: Secondary | ICD-10-CM | POA: Diagnosis not present

## 2023-01-31 DIAGNOSIS — H353121 Nonexudative age-related macular degeneration, left eye, early dry stage: Secondary | ICD-10-CM | POA: Diagnosis not present

## 2023-01-31 DIAGNOSIS — H40013 Open angle with borderline findings, low risk, bilateral: Secondary | ICD-10-CM | POA: Diagnosis not present

## 2023-01-31 DIAGNOSIS — H353211 Exudative age-related macular degeneration, right eye, with active choroidal neovascularization: Secondary | ICD-10-CM | POA: Diagnosis not present

## 2023-02-04 DIAGNOSIS — H35033 Hypertensive retinopathy, bilateral: Secondary | ICD-10-CM | POA: Diagnosis not present

## 2023-02-04 DIAGNOSIS — H353211 Exudative age-related macular degeneration, right eye, with active choroidal neovascularization: Secondary | ICD-10-CM | POA: Diagnosis not present

## 2023-02-04 DIAGNOSIS — H34231 Retinal artery branch occlusion, right eye: Secondary | ICD-10-CM | POA: Diagnosis not present

## 2023-02-04 DIAGNOSIS — H43823 Vitreomacular adhesion, bilateral: Secondary | ICD-10-CM | POA: Diagnosis not present

## 2023-02-04 DIAGNOSIS — Z961 Presence of intraocular lens: Secondary | ICD-10-CM | POA: Diagnosis not present

## 2023-02-04 DIAGNOSIS — H353123 Nonexudative age-related macular degeneration, left eye, advanced atrophic without subfoveal involvement: Secondary | ICD-10-CM | POA: Diagnosis not present

## 2023-03-07 ENCOUNTER — Other Ambulatory Visit: Payer: Self-pay | Admitting: *Deleted

## 2023-03-07 DIAGNOSIS — M7989 Other specified soft tissue disorders: Secondary | ICD-10-CM

## 2023-03-07 DIAGNOSIS — I824Y1 Acute embolism and thrombosis of unspecified deep veins of right proximal lower extremity: Secondary | ICD-10-CM

## 2023-03-08 DIAGNOSIS — H353211 Exudative age-related macular degeneration, right eye, with active choroidal neovascularization: Secondary | ICD-10-CM | POA: Diagnosis not present

## 2023-03-19 ENCOUNTER — Ambulatory Visit (INDEPENDENT_AMBULATORY_CARE_PROVIDER_SITE_OTHER): Payer: Medicare Other | Admitting: Physician Assistant

## 2023-03-19 ENCOUNTER — Ambulatory Visit (INDEPENDENT_AMBULATORY_CARE_PROVIDER_SITE_OTHER)
Admission: RE | Admit: 2023-03-19 | Discharge: 2023-03-19 | Disposition: A | Discharge status: Home / Self Care | Payer: Medicare Other | Source: Ambulatory Visit | Attending: Surgery | Admitting: Surgery

## 2023-03-19 ENCOUNTER — Ambulatory Visit (HOSPITAL_COMMUNITY)
Admission: RE | Admit: 2023-03-19 | Discharge: 2023-03-19 | Disposition: A | Discharge status: Home / Self Care | Payer: Medicare Other | Source: Ambulatory Visit | Attending: Surgery | Admitting: Surgery

## 2023-03-19 VITALS — BP 170/92 | HR 59 | Temp 97.9°F | Resp 18 | Ht 66.0 in | Wt 195.0 lb

## 2023-03-19 DIAGNOSIS — M7989 Other specified soft tissue disorders: Secondary | ICD-10-CM | POA: Insufficient documentation

## 2023-03-19 DIAGNOSIS — I824Y1 Acute embolism and thrombosis of unspecified deep veins of right proximal lower extremity: Secondary | ICD-10-CM | POA: Insufficient documentation

## 2023-03-19 DIAGNOSIS — I82501 Chronic embolism and thrombosis of unspecified deep veins of right lower extremity: Secondary | ICD-10-CM

## 2023-03-19 DIAGNOSIS — M79604 Pain in right leg: Secondary | ICD-10-CM | POA: Diagnosis not present

## 2023-03-19 NOTE — Progress Notes (Unsigned)
Requested by:  Irena Reichmann, DO [address] [place],  [place] [address]  Reason for consultation: ***    History of Present Illness   Keith [name] is a 82 y.o. (Mar 30, 1941) male who presents for evaluation of ***  Venous symptoms include: (aching, heavy, tired, throbbing, burning, itching, swelling, bleeding, ulcer)  *** Onset/duration:  ***  Occupation:  *** Aggravating factors: (sitting, standing) Alleviating factors: (elevation) Compression:  *** Helps:  *** Pain medications:  *** Previous vein procedures:  *** History of DVT:  ***  Symptoms much better. Very little swelling of right leg now. No more pain  Past Medical History:  Diagnosis Date   Abnormal nuclear stress test 02/24/2010   Nuclear stress 02/24/10 Infero lateral ischemia     Allergic rhinitis, cause unspecified    skin test pos 02-23-2008   Angina pectoris (HCC)    CAD (coronary artery disease)    CKD (chronic kidney disease) stage 3, GFR 30-59 ml/min (HCC) 07/23/2018   Dyspnea on exertion 07/23/2018   GERD (gastroesophageal reflux disease)    Hypercholesteremia 07/23/2018   Kidney stone    Migraine    Nephrolithiasis    Resistant hypertension 07/23/2018   TIA (transient ischemic attack)     Past Surgical History:  Procedure Laterality Date   bilateral tkr     EXTRACORPOREAL SHOCK WAVE LITHOTRIPSY Right 03/02/2019   Procedure: EXTRACORPOREAL SHOCK WAVE LITHOTRIPSY (ESWL);  Surgeon: Crista Elliot, MD;  Location: [facility];  Service: Urology;  Laterality: Right;   HERNIA REPAIR     reset nasal fracture     TONSILLECTOMY      Social History   Socioeconomic History   Marital status: Married    Spouse name: Not on file   Number of children: 1   Years of education: Not on file   Highest education level: Not on file  Occupational History   Occupation: retired   Occupation: works part Manufacturing engineer  Tobacco Use   Smoking status: Never   Smokeless tobacco: Never   Vaping Use   Vaping status: Never Used  Substance and Sexual Activity   Alcohol use: No    Alcohol/week: 0.0 standard drinks of alcohol    Comment: Pt used to have alcohol problems.   Drug use: No   Sexual activity: Not on file  Other Topics Concern   Not on file  Social History Narrative   Not on file   Social Determinants of Health   Financial Resource Strain: Not on file  Food Insecurity: Not on file  Transportation Needs: Not on file  Physical Activity: Not on file  Stress: Not on file  Social Connections: Not on file  Intimate Partner Violence: Not on file   *** Family History  Problem Relation Age of Onset   Allergy (severe) Mother    Heart attack Father    Stroke Father    COPD Brother    Heart attack Brother    Tuberculosis Other        grandmother    Current Outpatient Medications  Medication Sig Dispense Refill   apixaban (ELIQUIS) 5 MG TABS tablet Take 1 tablet (5 mg total) by mouth 2 (two) times daily. Start taking after completion of starter pack. 60 tablet 2   atorvastatin (LIPITOR) 10 MG tablet TAKE 1 TABLET(10 MG) BY MOUTH DAILY 90 tablet 3   cloNIDine (CATAPRES - DOSED IN MG/24 HR) 0.1 mg/24hr patch Place 1 patch (0.1 mg total) onto the  skin once a week. 12 patch 3   finasteride (PROSCAR) 5 MG tablet Take 5 mg by mouth daily.     fluticasone (FLONASE) 50 MCG/ACT nasal spray Place 2 sprays into both nostrils daily.     hydrALAZINE (APRESOLINE) 50 MG tablet Take 50 mg by mouth 3 (three) times daily.     hydrochlorothiazide (MICROZIDE) 12.5 MG capsule TAKE 1 CAPSULE(12.5 MG) BY MOUTH DAILY 90 capsule 3   HYDROcodone-acetaminophen (NORCO) 10-325 MG tablet Take 1 tablet by mouth every 6 (six) hours as needed.     ipratropium (ATROVENT) 0.03 % nasal spray Place 2 sprays into both nostrils every 12 (twelve) hours.     Nebivolol HCl (BYSTOLIC) 20 MG TABS Take 1 tablet (20 mg total) by mouth daily. 100 tablet 3   pantoprazole (PROTONIX) 40 MG tablet Take 40 mg  by mouth daily.     spironolactone (ALDACTONE) 25 MG tablet TAKE 1/2 TABLET(12.5 MG) BY MOUTH EVERY MORNING (Patient taking differently: Take 25 mg by mouth daily.) 45 tablet 3   tamsulosin (FLOMAX) 0.4 MG CAPS capsule Take 0.4 mg by mouth.     No current facility-administered medications for this visit.    Allergies  Allergen Reactions   Amlodipine Swelling   Labetalol Swelling    Swollen lips    Lisinopril Cough   Penicillins Rash   Allopurinol     Other reaction(s): rash   Amoxicillin-Pot Clavulanate     Other reaction(s): swelling of the neck ad throat   Azithromycin     Other reaction(s): Unknown   Benzonatate     Other reaction(s): Unknown   Cefuroxime     Other reaction(s): facial swelling, rash   Codeine     Other reaction(s): itching, rash   Epinephrine     Other reaction(s): tachycardia   Procaine Hcl     REACTION: increased bp   Valsartan     Other reaction(s): cough   Prednisone Rash    ***REVIEW OF SYSTEMS (negative unless checked):   Cardiac:  []  Chest pain or chest pressure? []  Shortness of breath upon activity? []  Shortness of breath when lying flat? []  Irregular heart rhythm?  Vascular:  []  Pain in calf, thigh, or hip brought on by walking? []  Pain in feet at night that wakes you up from your sleep? []  Blood clot in your veins? []  Leg swelling?  Pulmonary:  []  Oxygen at home? []  Productive cough? []  Wheezing?  Neurologic:  []  Sudden weakness in arms or legs? []  Sudden numbness in arms or legs? []  Sudden onset of difficult speaking or slurred speech? []  Temporary loss of vision in one eye? []  Problems with dizziness?  Gastrointestinal:  []  Blood in stool? []  Vomited blood?  Genitourinary:  []  Burning when urinating? []  Blood in urine?  Psychiatric:  []  Major depression  Hematologic:  []  Bleeding problems? []  Problems with blood clotting?  Dermatologic:  []  Rashes or ulcers?  Constitutional:  []  Fever or  chills?  Ear/Nose/Throat:  []  Change in hearing? []  Nose bleeds? []  Sore throat?  Musculoskeletal:  []  Back pain? []  Joint pain? []  Muscle pain?   Physical Examination    There were no vitals filed for this visit. There is no height or weight on file to calculate BMI.  General:  WDWN in NAD; vital signs documented above Gait: Not observed HENT: WNL, normocephalic Pulmonary: normal non-labored breathing , without Rales, rhonchi,  wheezing Cardiac: {Desc; regular/irreg:14544} HR, without  Murmurs {With/Without:20273} carotid bruit*** Abdomen: soft, NT, no  masses Skin: {With/Without:20273} rashes Vascular Exam/Pulses:  Right Left  Radial {Exam; arterial pulse strength 0-4:30167} {Exam; arterial pulse strength 0-4:30167}  Ulnar {Exam; arterial pulse strength 0-4:30167} {Exam; arterial pulse strength 0-4:30167}  Femoral {Exam; arterial pulse strength 0-4:30167} {Exam; arterial pulse strength 0-4:30167}  Popliteal {Exam; arterial pulse strength 0-4:30167} {Exam; arterial pulse strength 0-4:30167}  DP {Exam; arterial pulse strength 0-4:30167} {Exam; arterial pulse strength 0-4:30167}  PT {Exam; arterial pulse strength 0-4:30167} {Exam; arterial pulse strength 0-4:30167}   Extremities: {With/Without:20273} varicose veins, {With/Without:20273} reticular veins, {With/Without:20273} edema, {With/Without:20273} stasis pigmentation, {With/Without:20273} lipodermatosclerosis, {With/Without:20273} ulcers Musculoskeletal: no muscle wasting or atrophy  Neurologic: A&O X 3;  No focal weakness or paresthesias are detected Psychiatric:  The pt has {Desc; normal/abnormal:11317::"Normal"} affect.  Non-invasive Vascular Imaging   BLE Venous Insufficiency Duplex (***):  RLE:  *** DVT and SVT,  *** GSV reflux ***, GSV diameter *** *** SSV reflux ***, *** deep venous reflux  LLE: *** DVT and SVT,  *** GSV reflux ***,  GSV diameter *** *** SSV reflux ***, *** deep venous  reflux   Medical Decision Making   JAYD GASCHLER is a 82 y.o. male who presents with: ***LE chronic venous insufficiency, ***varicose veins with complications  Based on the patient's history and examination, I recommend: ***. I discussed with the patient the use of her 20-30 mm thigh high compression stockings and need for 3 month trial of such. The patient will follow up in 3 months with Dr. Marland Kitchen Thank you for allowing [place] to participate in this patient's care. 3 more months of eliquis, then ASA 81mg . F/u as needed   Ernestene Mention, PA-C [facility] Office: [phone]  03/19/2023, 8:02 AM  Clinic MD: Steve Rattler

## 2023-04-02 DIAGNOSIS — Z23 Encounter for immunization: Secondary | ICD-10-CM | POA: Diagnosis not present

## 2023-04-05 DIAGNOSIS — H353123 Nonexudative age-related macular degeneration, left eye, advanced atrophic without subfoveal involvement: Secondary | ICD-10-CM | POA: Diagnosis not present

## 2023-04-05 DIAGNOSIS — H35033 Hypertensive retinopathy, bilateral: Secondary | ICD-10-CM | POA: Diagnosis not present

## 2023-04-05 DIAGNOSIS — Z961 Presence of intraocular lens: Secondary | ICD-10-CM | POA: Diagnosis not present

## 2023-04-05 DIAGNOSIS — H43823 Vitreomacular adhesion, bilateral: Secondary | ICD-10-CM | POA: Diagnosis not present

## 2023-04-05 DIAGNOSIS — H353211 Exudative age-related macular degeneration, right eye, with active choroidal neovascularization: Secondary | ICD-10-CM | POA: Diagnosis not present

## 2023-04-05 DIAGNOSIS — H34231 Retinal artery branch occlusion, right eye: Secondary | ICD-10-CM | POA: Diagnosis not present

## 2023-04-11 ENCOUNTER — Telehealth (HOSPITAL_COMMUNITY): Payer: Self-pay | Admitting: Student-PharmD

## 2023-04-11 MED ORDER — APIXABAN 5 MG PO TABS: 5.0000 mg | ORAL_TABLET | Freq: Two times a day (BID) | ORAL | 0 refills | Status: DC

## 2023-04-11 NOTE — Telephone Encounter (Signed)
Patient called requesting refill of Eliquis. Says he has about 10 tablets left. When he was seen at [facility] last month the plan was made to continue Eliquis for a total of 6 months (start date: 12/14/22; will finish around 06/15/22) then discontinue Eliquis and switch to aspirin 81 mg daily. He needs refills to finish his last 2 months of Eliquis. I've sent this to his preferred pharmacy. He also asked for the phone number to [facility] because he lost his paperwork that had their number on it.

## 2023-05-12 IMAGING — CT CT ABD-PELV W/ CM
2 of 5 series · 17 of 46 positions shown, 19 images · IV contrast (OMNIPAQUE 300)
Comparison: CT abdomen and pelvis 07/20/2021

CLINICAL DATA: Acute abdominal pain.

EXAM:
CT ABDOMEN AND PELVIS WITH CONTRAST
TECHNIQUE: Multidetector CT imaging of the abdomen and pelvis was performed
using the standard protocol following bolus administration of
intravenous contrast.

[Series 2: axial st · axial · 0.98mm/px · z∈[+530,+920]mm · 14 of 90 slices shown, 16 images]
[im 6/90  soft-tissue]
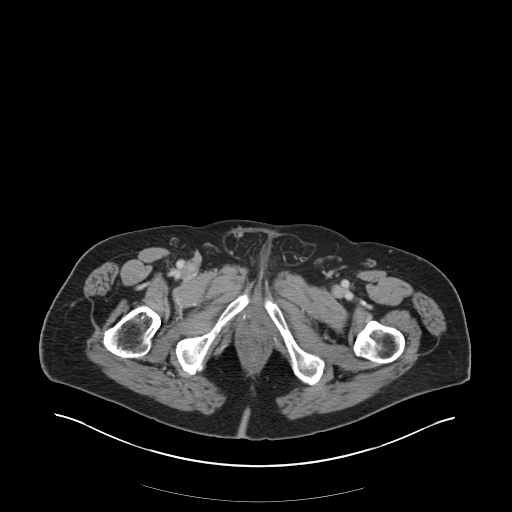
[im 6/90  bone]
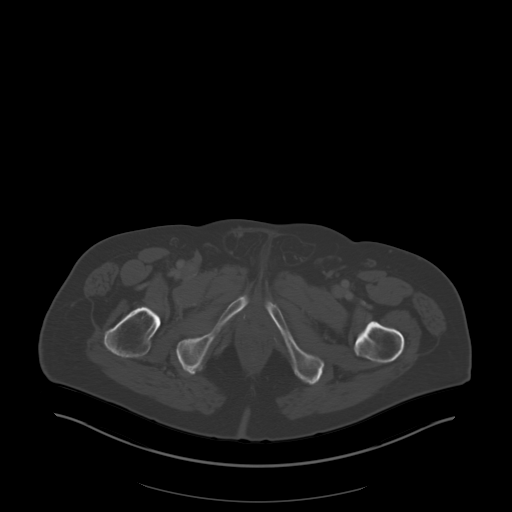
[im 12/90  soft-tissue]
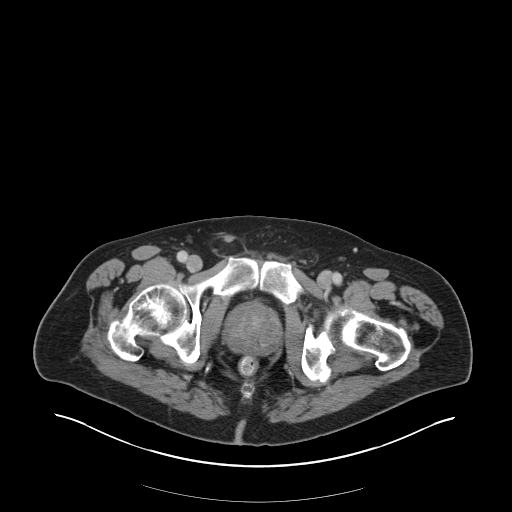
[im 18/90  soft-tissue]
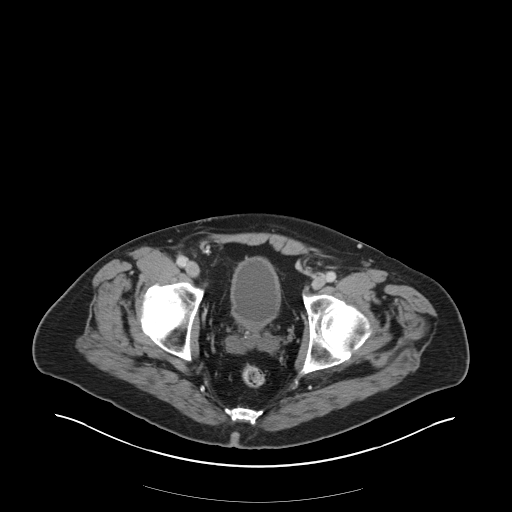
[im 24/90  soft-tissue]
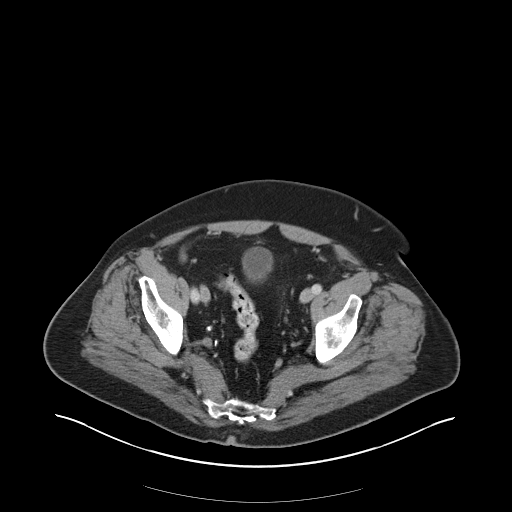
[im 30/90  soft-tissue]
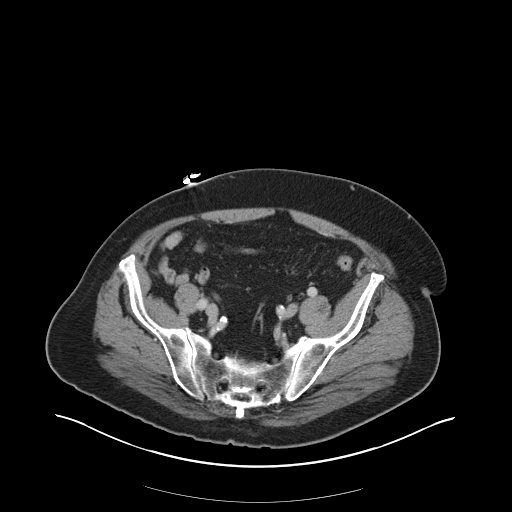
[im 36/90  soft-tissue]
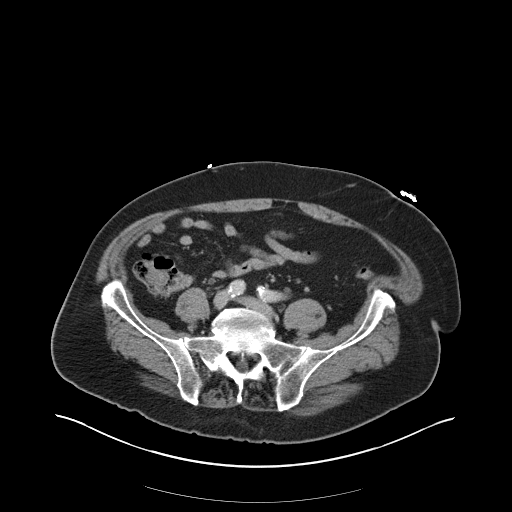
[im 42/90  soft-tissue]
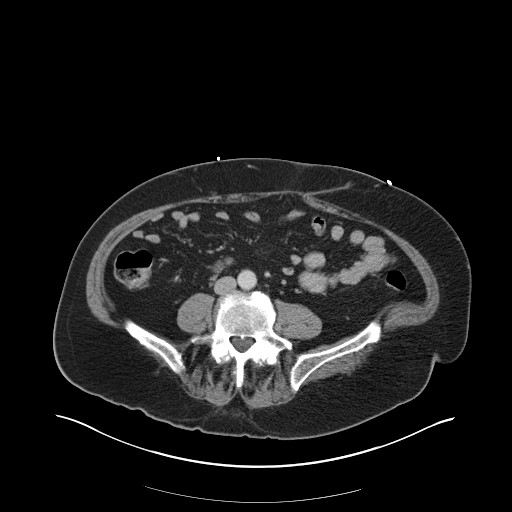
[im 48/90  soft-tissue]
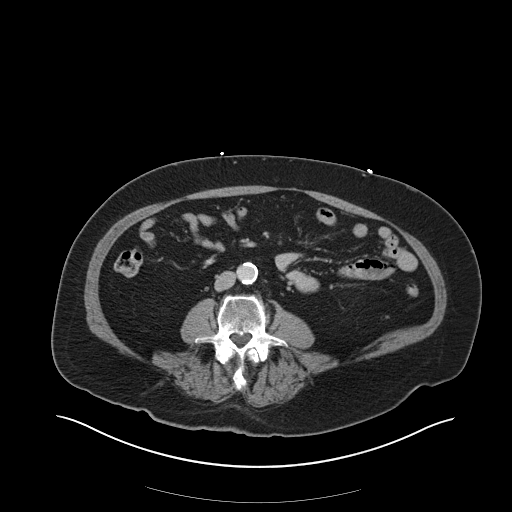
[im 54/90  soft-tissue]
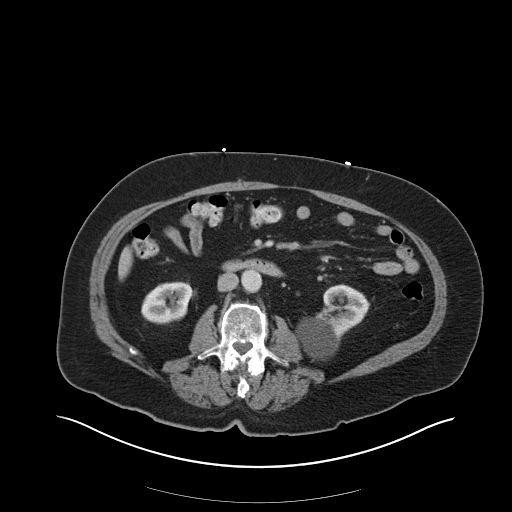
[im 54/90  bone]
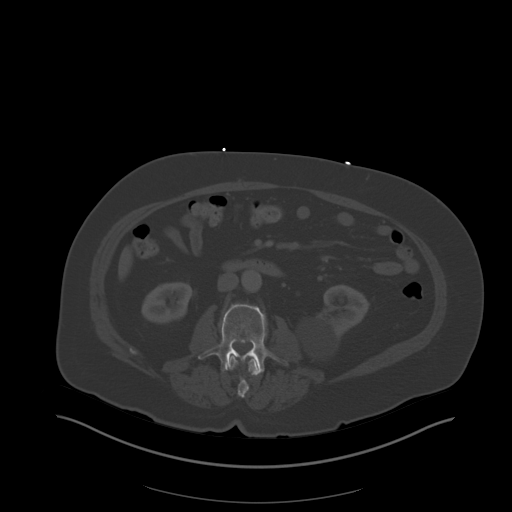
[im 60/90  soft-tissue]
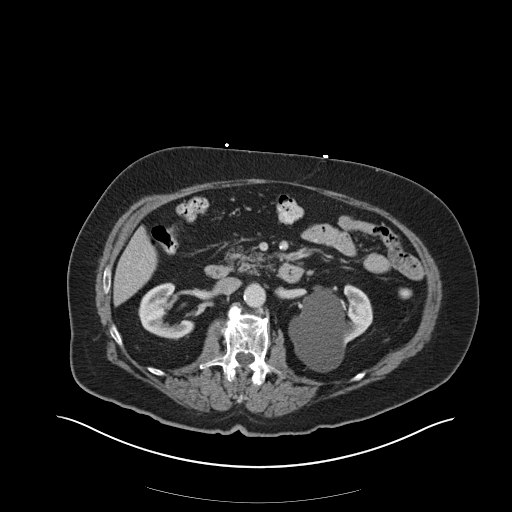
[im 66/90  soft-tissue]
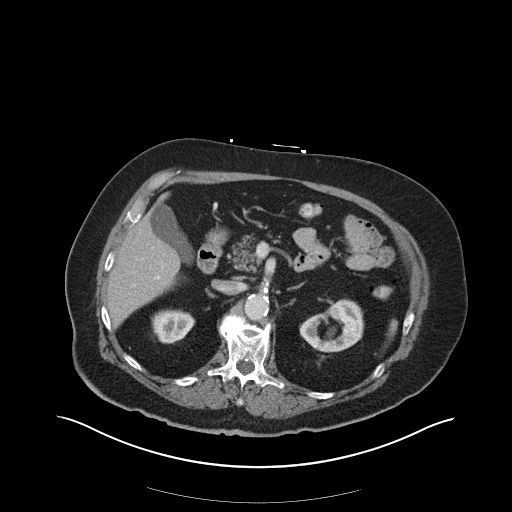
[im 72/90  soft-tissue]
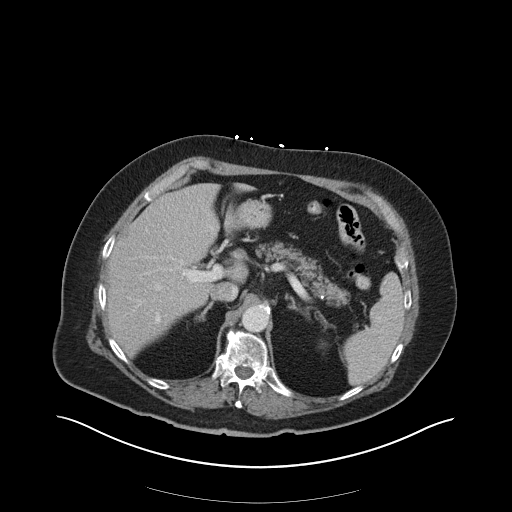
[im 78/90  soft-tissue]
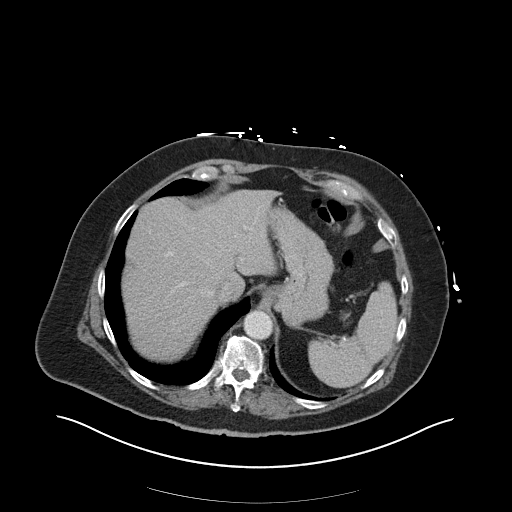
[im 84/90  soft-tissue]
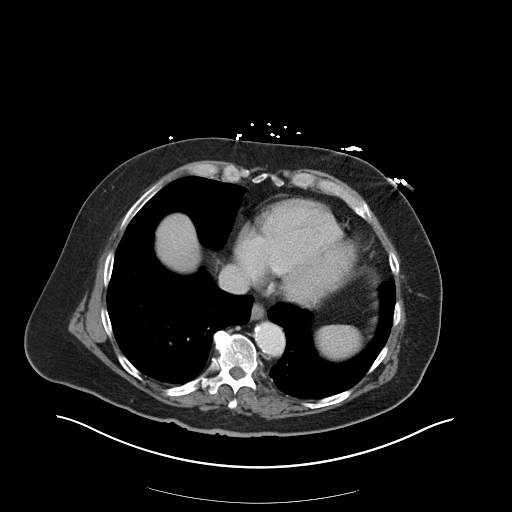

[Series 6: coronal st · coronal · 0.91mm/px · 3 of 164 slices shown]
[im 55/164  soft-tissue]
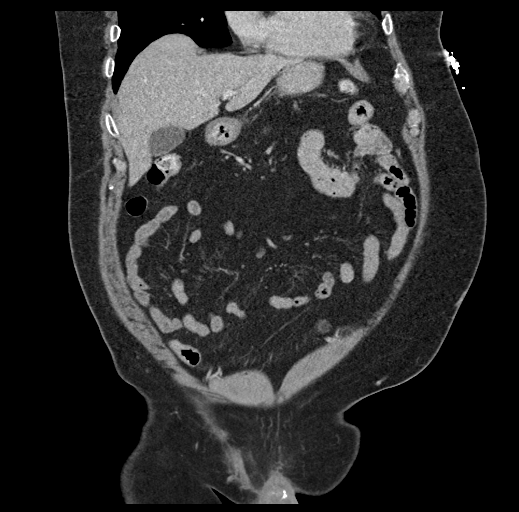
[im 73/164  soft-tissue]
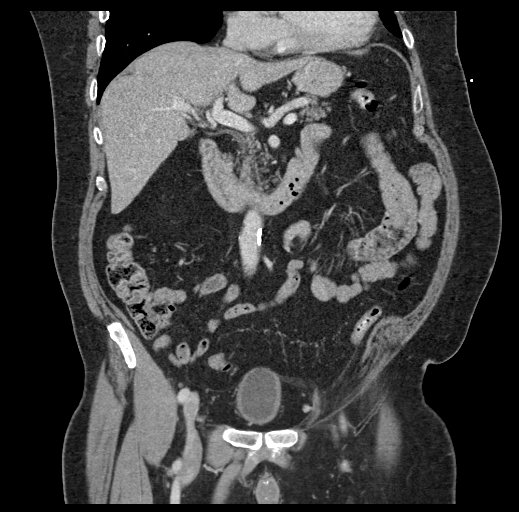
[im 91/164  soft-tissue]
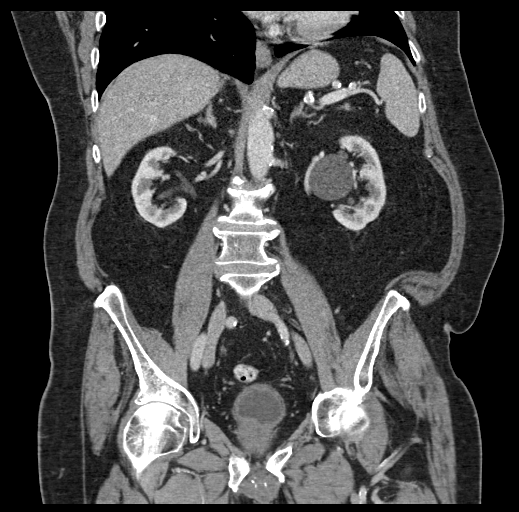

[17 of 46 positions shown; findings below may reference images not displayed]

RADIATION DOSE REDUCTION: This exam was performed according to the
departmental dose-optimization program which includes automated
exposure control, adjustment of the mA and/or kV according to
patient size and/or use of iterative reconstruction technique.

CONTRAST:  100mL OMNIPAQUE IOHEXOL 300 MG/ML  SOLN
FINDINGS: Lower chest: No acute abnormality.

Hepatobiliary: No focal liver abnormality is seen. No gallstones,
gallbladder wall thickening, or biliary dilatation.

Pancreas: Unremarkable. No pancreatic ductal dilatation or
surrounding inflammatory changes.

Spleen: Normal in size without focal abnormality.

Adrenals/Urinary Tract: There is no hydronephrosis or perinephric
fluid. Adrenal glands and bladder are within normal limits. There is
a 7.8 x 5.5 cm cyst in the left kidney which is unchanged.

Stomach/Bowel: Stomach is within normal limits. Appendix appears
normal. No evidence of bowel wall thickening, distention, or
inflammatory changes.

Vascular/Lymphatic: Aortic atherosclerosis. No enlarged abdominal or
pelvic lymph nodes.

Reproductive: Prostate gland is enlarged measuring 5.4 x 5.1 x
cm, similar the prior study.

Other: There are small fat containing bilateral inguinal and
umbilical hernias. There is no ascites.

Musculoskeletal: Multilevel degenerative changes affect the spine.
IMPRESSION: 1. No acute localizing process in the abdomen or pelvis.
2. Stable prostatomegaly.
3.  Aortic Atherosclerosis ([id number]).

## 2023-05-13 DIAGNOSIS — H353211 Exudative age-related macular degeneration, right eye, with active choroidal neovascularization: Secondary | ICD-10-CM | POA: Diagnosis not present

## 2023-05-14 ENCOUNTER — Ambulatory Visit: Payer: Medicare Other | Admitting: Cardiology

## 2023-05-18 ENCOUNTER — Other Ambulatory Visit: Payer: Self-pay | Admitting: Cardiology

## 2023-05-18 DIAGNOSIS — I1A0 Resistant hypertension: Secondary | ICD-10-CM

## 2023-05-25 ENCOUNTER — Ambulatory Visit (INDEPENDENT_AMBULATORY_CARE_PROVIDER_SITE_OTHER): Payer: Medicare Other

## 2023-05-25 ENCOUNTER — Ambulatory Visit (HOSPITAL_COMMUNITY)
Admission: EM | Admit: 2023-05-25 | Discharge: 2023-05-25 | Disposition: A | Discharge status: Home / Self Care | Payer: Medicare Other | Attending: Emergency Medicine | Admitting: Emergency Medicine

## 2023-05-25 ENCOUNTER — Encounter (HOSPITAL_COMMUNITY): Payer: Self-pay

## 2023-05-25 DIAGNOSIS — J690 Pneumonitis due to inhalation of food and vomit: Secondary | ICD-10-CM

## 2023-05-25 DIAGNOSIS — I700 Atherosclerosis of aorta: Secondary | ICD-10-CM | POA: Diagnosis not present

## 2023-05-25 DIAGNOSIS — R059 Cough, unspecified: Secondary | ICD-10-CM | POA: Diagnosis not present

## 2023-05-25 DIAGNOSIS — J069 Acute upper respiratory infection, unspecified: Secondary | ICD-10-CM

## 2023-05-25 DIAGNOSIS — R918 Other nonspecific abnormal finding of lung field: Secondary | ICD-10-CM | POA: Diagnosis not present

## 2023-05-25 MED ORDER — METRONIDAZOLE 500 MG PO TABS: 500.0000 mg | ORAL_TABLET | Freq: Two times a day (BID) | ORAL | 0 refills | Status: AC

## 2023-05-25 MED ORDER — LEVOFLOXACIN 500 MG PO TABS: 500.0000 mg | ORAL_TABLET | Freq: Every day | ORAL | 0 refills | Status: DC

## 2023-05-25 MED ORDER — DOXYCYCLINE HYCLATE 100 MG PO CAPS: 100.0000 mg | ORAL_CAPSULE | Freq: Two times a day (BID) | ORAL | 0 refills | Status: DC

## 2023-05-25 NOTE — ED Triage Notes (Signed)
 Pt states that he has a cough and some chest congestion x2 days

## 2023-05-25 NOTE — ED Provider Notes (Addendum)
 [facility]   [id number] 05/25/23 Arrival Time: 1657   Chief Complaint  Patient presents with   Cough    Cough and chest congestion x2 days    SUBJECTIVE: History from: patient.  Keith [name] is a 83 y.o. male who presents to the urgent care with a complaint of cough and chest congestion for the past 2 days.  Denies sick exposure to COVID, flu or strep.  Denies recent travel.  Has tried OTC medication without relief.  Denies any alleviating or aggravating factors.  Denies previous symptoms in the past.   Denies fever, chills, fatigue, sinus pain, rhinorrhea, sore throat, SOB, wheezing, chest pain, nausea, changes in bowel or bladder habits.     ROS: As per HPI.  All other pertinent ROS negative.      Past Medical History:  Diagnosis Date   Abnormal nuclear stress test 02/24/2010   Nuclear stress 02/24/10 Infero lateral ischemia     Allergic rhinitis, cause unspecified    skin test pos 02-23-2008   Angina pectoris (HCC)    CAD (coronary artery disease)    CKD (chronic kidney disease) stage 3, GFR 30-59 ml/min (HCC) 07/23/2018   Dyspnea on exertion 07/23/2018   GERD (gastroesophageal reflux disease)    Hypercholesteremia 07/23/2018   Kidney stone    Migraine    Nephrolithiasis    Resistant hypertension 07/23/2018   TIA (transient ischemic attack)    Past Surgical History:  Procedure Laterality Date   bilateral tkr     EXTRACORPOREAL SHOCK WAVE LITHOTRIPSY Right 03/02/2019   Procedure: EXTRACORPOREAL SHOCK WAVE LITHOTRIPSY (ESWL);  Surgeon: Carolee Sherwood JONETTA DOUGLAS, MD;  Location: [facility];  Service: Urology;  Laterality: Right;   HERNIA REPAIR     reset nasal fracture     TONSILLECTOMY     Allergies  Allergen Reactions   Amlodipine Swelling   Labetalol Swelling    Swollen lips    Lisinopril Cough   Penicillins Rash   Allopurinol     Other reaction(s): rash   Amoxicillin-Pot Clavulanate     Other reaction(s): swelling of the neck ad throat   Azithromycin      Other reaction(s): Unknown   Benzonatate     Other reaction(s): Unknown   Cefuroxime     Other reaction(s): facial swelling, rash   Codeine     Other reaction(s): itching, rash   Epinephrine     Other reaction(s): tachycardia   Procaine Hcl     REACTION: increased bp   Valsartan     Other reaction(s): cough   Prednisone Rash   No current facility-administered medications on file prior to encounter.   Current Outpatient Medications on File Prior to Encounter  Medication Sig Dispense Refill   apixaban  (ELIQUIS ) 5 MG TABS tablet Take 1 tablet (5 mg total) by mouth 2 (two) times daily. 120 tablet 0   atorvastatin  (LIPITOR) 10 MG tablet TAKE 1 TABLET(10 MG) BY MOUTH DAILY 90 tablet 3   cloNIDine  (CATAPRES  - DOSED IN MG/24 HR) 0.1 mg/24hr patch APPLY 1 PATCH ONTO THE SKIN EVERY WEEK 12 patch 0   finasteride  (PROSCAR ) 5 MG tablet Take 5 mg by mouth daily.     fluticasone (FLONASE) 50 MCG/ACT nasal spray Place 2 sprays into both nostrils daily.     hydrALAZINE  (APRESOLINE ) 50 MG tablet Take 50 mg by mouth 3 (three) times daily.     hydrochlorothiazide  (MICROZIDE ) 12.5 MG capsule TAKE 1 CAPSULE(12.5 MG) BY MOUTH DAILY 90 capsule 3  HYDROcodone -acetaminophen  (NORCO) 10-325 MG tablet Take 1 tablet by mouth every 6 (six) hours as needed.     ipratropium (ATROVENT) 0.03 % nasal spray Place 2 sprays into both nostrils every 12 (twelve) hours.     Nebivolol  HCl (BYSTOLIC ) 20 MG TABS Take 1 tablet (20 mg total) by mouth daily. 100 tablet 3   pantoprazole  (PROTONIX ) 40 MG tablet Take 40 mg by mouth daily.     spironolactone  (ALDACTONE ) 25 MG tablet TAKE 1/2 TABLET(12.5 MG) BY MOUTH EVERY MORNING (Patient taking differently: Take 25 mg by mouth daily.) 45 tablet 3   tamsulosin  (FLOMAX ) 0.4 MG CAPS capsule Take 0.4 mg by mouth.     Social History   Socioeconomic History   Marital status: Married    Spouse name: Not on file   Number of children: 1   Years of education: Not on file   Highest  education level: Not on file  Occupational History   Occupation: retired   Occupation: works part manufacturing engineer  Tobacco Use   Smoking status: Never   Smokeless tobacco: Never  Vaping Use   Vaping status: Never Used  Substance and Sexual Activity   Alcohol use: No    Alcohol/week: 0.0 standard drinks of alcohol    Comment: Pt used to have alcohol problems.   Drug use: No   Sexual activity: Not on file  Other Topics Concern   Not on file  Social History Narrative   Not on file   Social Drivers of Health   Financial Resource Strain: Not on file  Food Insecurity: Not on file  Transportation Needs: Not on file  Physical Activity: Not on file  Stress: Not on file  Social Connections: Not on file  Intimate Partner Violence: Not on file   Family History  Problem Relation Age of Onset   Allergy (severe) Mother    Heart attack Father    Stroke Father    COPD Brother    Heart attack Brother    Tuberculosis Other        grandmother    OBJECTIVE:  Vitals:   05/25/23 1745 05/25/23 1747  BP:  (!) 182/84  Pulse:  63  Resp:  16  Temp:  98.1 F (36.7 C)  TempSrc:  Oral  SpO2:  98%  Weight: 195 lb (88.5 kg)   Height: 5' 6 (1.676 m)      Physical Exam Vitals and nursing note reviewed.  Constitutional:      General: He is not in acute distress.    Appearance: Normal appearance. He is normal weight. He is not ill-appearing, toxic-appearing or diaphoretic.  HENT:     Right Ear: Tympanic membrane, ear canal and external ear normal. There is no impacted cerumen.     Left Ear: Tympanic membrane, ear canal and external ear normal. There is no impacted cerumen.     Nose: Nose normal.     Mouth/Throat:     Lips: Pink.     Mouth: Mucous membranes are moist.     Pharynx: No pharyngeal swelling or oropharyngeal exudate.     Tonsils: No tonsillar exudate or tonsillar abscesses. 1+ on the right. 1+ on the left.  Cardiovascular:     Rate and Rhythm: Normal rate and regular  rhythm.     Pulses: Normal pulses.     Heart sounds: Normal heart sounds. No murmur heard.    No friction rub. No gallop.  Pulmonary:     Effort: Pulmonary effort is normal.  No respiratory distress.     Breath sounds: Normal breath sounds. No stridor. No wheezing, rhonchi or rales.  Chest:     Chest wall: No tenderness.  Neurological:     Mental Status: He is alert and oriented to person, place, and time.      LABS:  No results found for this or any previous visit (from the past 24 hours).    RADIOLOGY  DG Chest 2 View Result Date: 05/25/2023 CLINICAL DATA:  Cough EXAM: CHEST - 2 VIEW COMPARISON:  X-ray 01/27/2009 FINDINGS: No consolidation, pneumothorax or effusion. No edema. Normal cardiopericardial silhouette. Calcified aorta. Subtle opacity in the right lung base. Degenerative changes along the spine. IMPRESSION: Subtle opacity in the right lung base. Atelectasis versus subtle infiltrate is possible. Recommend follow-up. Electronically Signed   By: Ranell Bring M.D.   On: 05/25/2023 18:45     Chest X-ray is positive for atelectasis versus infiltrate right lower lungs.  I have reviewed the x-ray myself and the radiologist interpretation.  I am in agreement with the radiologist interpretation.   ASSESSMENT & PLAN:  1. URI, acute   2. Aspiration pneumonia of right lower lobe, unspecified aspiration pneumonia type (HCC)     Meds ordered this encounter  Medications   DISCONTD: doxycycline  (VIBRAMYCIN ) 100 MG capsule    Sig: Take 1 capsule (100 mg total) by mouth 2 (two) times daily.    Dispense:  20 capsule    Refill:  0   levofloxacin  (LEVAQUIN ) 500 MG tablet    Sig: Take 1 tablet (500 mg total) by mouth daily.    Dispense:  7 tablet    Refill:  0   metroNIDAZOLE  (FLAGYL ) 500 MG tablet    Sig: Take 1 tablet (500 mg total) by mouth 2 (two) times daily for 7 days.    Dispense:  14 tablet    Refill:  0    Discharge Instructions  Get plenty of rest and push  fluids Levaquin  and Flagyl  were prescribed Use OTC Mucinex for congestion Use medications daily for symptom relief Use OTC medications like ibuprofen or tylenol  as needed fever or pain Call or go to the ED if you have any new or worsening symptoms such as fever, worsening cough, shortness of breath, chest tightness, chest pain, turning blue, changes in mental status, etc...   Reviewed expectations re: course of current medical issues. Questions answered. Outlined signs and symptoms indicating need for more acute intervention. Patient verbalized understanding. After Visit Summary given.          Rayme Bui S, FNP 05/25/23 1904    Davinci Glotfelty S, FNP 05/25/23 1904

## 2023-05-25 NOTE — Discharge Instructions (Addendum)
 Get plenty of rest and push fluids Flagyl  and Levaquin  were prescribed Use OTC Mucinex for congestion Use medications daily for symptom relief Use OTC medications like ibuprofen or tylenol  as needed fever or pain Call or go to the ED if you have any new or worsening symptoms such as fever, worsening cough, shortness of breath, chest tightness, chest pain, turning blue, changes in mental status, etc..SABRA

## 2023-05-27 ENCOUNTER — Ambulatory Visit (INDEPENDENT_AMBULATORY_CARE_PROVIDER_SITE_OTHER): Payer: Medicare Other

## 2023-06-11 DIAGNOSIS — R059 Cough, unspecified: Secondary | ICD-10-CM | POA: Diagnosis not present

## 2023-06-20 DIAGNOSIS — M109 Gout, unspecified: Secondary | ICD-10-CM | POA: Diagnosis not present

## 2023-06-24 DIAGNOSIS — H353211 Exudative age-related macular degeneration, right eye, with active choroidal neovascularization: Secondary | ICD-10-CM | POA: Diagnosis not present

## 2023-07-09 ENCOUNTER — Encounter: Payer: Self-pay | Admitting: Cardiology

## 2023-07-09 ENCOUNTER — Ambulatory Visit: Payer: Medicare Other | Attending: Cardiology | Admitting: Cardiology

## 2023-07-09 VITALS — BP 190/109 | HR 68

## 2023-07-09 DIAGNOSIS — I824Y1 Acute embolism and thrombosis of unspecified deep veins of right proximal lower extremity: Secondary | ICD-10-CM | POA: Diagnosis not present

## 2023-07-09 DIAGNOSIS — I1A0 Resistant hypertension: Secondary | ICD-10-CM | POA: Diagnosis not present

## 2023-07-09 DIAGNOSIS — R4189 Other symptoms and signs involving cognitive functions and awareness: Secondary | ICD-10-CM

## 2023-07-09 DIAGNOSIS — I130 Hypertensive heart and chronic kidney disease with heart failure and stage 1 through stage 4 chronic kidney disease, or unspecified chronic kidney disease: Secondary | ICD-10-CM | POA: Diagnosis not present

## 2023-07-09 DIAGNOSIS — N1832 Chronic kidney disease, stage 3b: Secondary | ICD-10-CM

## 2023-07-09 DIAGNOSIS — N183 Chronic kidney disease, stage 3 unspecified: Secondary | ICD-10-CM

## 2023-07-09 DIAGNOSIS — I5032 Chronic diastolic (congestive) heart failure: Secondary | ICD-10-CM | POA: Diagnosis not present

## 2023-07-09 MED ORDER — BUMETANIDE 0.5 MG PO TABS
0.5000 mg | ORAL_TABLET | ORAL | 1 refills | Status: DC
Start: 2023-07-09 — End: 2024-01-17

## 2023-07-09 NOTE — Progress Notes (Signed)
Cardiology Office Note:  .   Date:  07/09/2023  ID:  Keith [name], DOB 02-20-1941, MRN [id number] PCP: Irena Reichmann, DO  [facility] Providers Cardiologist:  Yates Decamp, MD   History of Present Illness: .   Loc Keith [name] is a 83 y.o. Caucasian male with resistant hypertension, gout, renal stones, hyperlipidemia, truncal obesity, resistant hypertension.  He was evaluated by orthopedics in July 2024 for right leg swelling and venous duplex revealed extensive right lower extremity DVT extending from common femoral vein.  He was started on anticoagulation.  After 3 months of therapy, anticoagulation was discontinued.  States that his leg edema has essentially resolved and is presently doing well and has no specific complaints.  Patient admits to not having been on antihypertensive medications for the last 3 days and he has refill waiting to be picked up.  He is accompanied by his daughter-in-law.  Denies chest pain, dyspnea, but daughter-in-law endorses decreased activity, has developed hypertensive retinopathy and also retinal disease, details of which are not known and patient has stopped driving due to vision issues.  Discussed the use of AI scribe software for clinical note transcription with the patient, who gave verbal consent to proceed.  History of Present Illness   The patient, with a history of high blood pressure, central retinal artery occlusion, gout, and a blood clot in the leg, presents with a recent weight loss of 10-15 pounds. He attributes the weight loss to a decreased appetite and recent illness, which he describes as a cold. He also mentions having been "sick a lot."  The patient's vision problems are a significant concern. He reports receiving injections in the eye every 4-6 weeks, which temporarily improve his vision. However, he also describes seeing "red dots" floating around after the injections. The cause of the retinal artery occlusion is unclear, but the patient  does not believe it is due to uncontrolled high blood pressure.  The patient's high blood pressure is another ongoing issue. He reports running out of his blood pressure medication, Bystolic, and missing doses for three days. He also questions whether he should be taking two Bystolic pills a day, but the doctor advises against this.  The patient's gout is currently being managed with allopurinol, which was prescribed recently. He reports that his feet and toes were swollen, but the medication seems to be helping.  Finally, the patient mentions a history of a blood clot in the leg. He is no longer on blood thinners and has been advised to take 81mg  of aspirin. The cause of the blood clot is unknown, but the patient recalls that it started with swelling in the foot and big toes.     Labs   Lab Results  Component Value Date   NA 138 12/14/2022   K 4.4 12/14/2022   CO2 26 12/14/2022   GLUCOSE 133 (H) 12/14/2022   BUN 32 (H) 12/14/2022   CREATININE 1.84 (H) 12/14/2022   CALCIUM 8.9 12/14/2022   EGFR 41 (L) 04/20/2022   GFRNONAA 36 (L) 12/14/2022      Latest Ref Rng & Units 12/14/2022    1:25 PM 04/20/2022    1:46 PM 03/21/2022   10:53 AM  BMP  Glucose 70 - 99 mg/dL 409  97  811   BUN 8 - 23 mg/dL 32  29  28   Creatinine 0.61 - 1.24 mg/dL 9.14  7.82  9.56   BUN/Creat Ratio 10 - 24  17  15  Sodium 135 - 145 mmol/L 138  133  141   Potassium 3.5 - 5.1 mmol/L 4.4  4.4  4.4   Chloride 98 - 111 mmol/L 102  102  101   CO2 22 - 32 mmol/L 26  25  27    Calcium 8.9 - 10.3 mg/dL 8.9  9.1  9.8       Latest Ref Rng & Units 12/14/2022    1:25 PM 08/07/2021   12:55 PM 01/23/2008   11:07 AM  CBC  WBC 4.0 - 10.5 K/uL 13.4  14.0  14.8   Hemoglobin 13.0 - 17.0 g/dL 96.2  95.2  84.1   Hematocrit 39.0 - 52.0 % 45.7  48.7  48.5   Platelets 150 - 400 K/uL 122  153  192     External Labs:  PCP Labs 06/19/2022:   Sodium 140, potassium 4.5, BUN 41, creatinine 1.63, EGFR 38 mL, LFTs normal   Urine  analysis normal.   Labs 12/31/2021:   Hb 15.2/HCT 45.2, platelets 142, normal indicis.   TSH normal at 5.03.   Total cholesterol 156, triglycerides 134, HDL 43, LDL 86.   A1c 5.5%.  Review of Systems  Cardiovascular:  Negative for chest pain, dyspnea on exertion and leg swelling.   Physical Exam:   VS:  BP (!) 190/109 (BP Location: Left Arm, Patient Position: Sitting, Cuff Size: Normal)   Pulse 68   SpO2 95%    Wt Readings from Last 3 Encounters:  05/25/23 195 lb (88.5 kg)  03/19/23 195 lb (88.5 kg)  06/21/22 202 lb (91.6 kg)   Physical Exam Neck:     Vascular: No JVD.  Cardiovascular:     Rate and Rhythm: Normal rate and regular rhythm.     Pulses: Intact distal pulses.          Popliteal pulses are 2+ on the right side and 2+ on the left side.       Dorsalis pedis pulses are 0 on the right side and 0 on the left side.       Posterior tibial pulses are 0 on the right side and 0 on the left side.     Heart sounds: S1 normal and S2 normal. Murmur heard.     Early systolic murmur is present with a grade of 2/6 at the upper right sternal border.     No gallop.  Pulmonary:     Effort: Pulmonary effort is normal.     Breath sounds: Normal breath sounds.  Abdominal:     General: Bowel sounds are normal.     Palpations: Abdomen is soft.  Musculoskeletal:     Right lower leg: Edema (trace) present.     Left lower leg: No edema.    Studies Reviewed: Marland Kitchen    ECHOCARDIOGRAM COMPLETE 06/29/2021  eft ventricle cavity is normal in size. Mild concentric hypertrophy of the left ventricle. Normal global wall motion. Normal LV systolic function with LVEF 50-55%. Doppler evidence of grade I (impaired) diastolic dysfunction, normal LAP. Left atrial cavity is mildly dilated. Trileaflet aortic valve with Mild aortic valve leaflet calcification. Trace aortic valve stenosis. No regurgitation. Mild (Grade I) mitral regurgitation. Mild tricuspid regurgitation. No evidence of pulmonary  hypertension. Mild pulmonic regurgitation. Previous study on 02/01/2020 noted LVEF 35-40%.   12/14/22 VAS [place] LOWER EXTREMITY VENOUS (DVT)RIGHT  - Findings consistent with acute deep vein thrombosis involving the right common femoral vein, SF junction, right femoral vein, right proximal  profunda vein, right popliteal vein,  and external iliac vein.  - Findings consistent with acute superficial vein thrombosis involving the right great saphenous vein.  - No cystic structure found in the popliteal fossa.   EKG:    EKG Interpretation Date/Time:  Tuesday July 09 2023 13:57:04 EST Ventricular Rate:  67 PR Interval:  168 QRS Duration:  100 QT Interval:  418 QTC Calculation: 441 R Axis:   -33  Text Interpretation: EKG 07/09/2023: Normal sinus rhythm at rate of 67 bpm, leftward axis, anterior infarct old.  No evidence of ischemia.  Single PVC.  Compared to 08/07/2021, frequent PVCs not present. Confirmed by Delrae Rend [phone]) on 07/09/2023 2:29:51 PM    Medications and allergies    Allergies  Allergen Reactions   Amlodipine Swelling   Labetalol Swelling    Swollen lips    Lisinopril Cough   Penicillins Rash   Allopurinol     Other reaction(s): rash   Amoxicillin-Pot Clavulanate     Other reaction(s): swelling of the neck ad throat   Azithromycin     Other reaction(s): Unknown   Benzonatate     Other reaction(s): Unknown   Cefuroxime     Other reaction(s): facial swelling, rash   Codeine     Other reaction(s): itching, rash   Epinephrine     Other reaction(s): tachycardia   Procaine Hcl     REACTION: increased bp   Valsartan     Other reaction(s): cough   Prednisone Rash     Current Outpatient Medications:    allopurinol (ZYLOPRIM) 100 MG tablet, Take 100 mg by mouth daily., Disp: , Rfl:    apixaban (ELIQUIS) 5 MG TABS tablet, Take 1 tablet (5 mg total) by mouth 2 (two) times daily., Disp: 120 tablet, Rfl: 0   atorvastatin (LIPITOR) 10 MG tablet, TAKE 1 TABLET(10  MG) BY MOUTH DAILY (Patient taking differently: Take 10 mg by mouth daily.), Disp: 90 tablet, Rfl: 3   bumetanide (BUMEX) 0.5 MG tablet, Take 1 tablet (0.5 mg total) by mouth every morning., Disp: 90 tablet, Rfl: 1   cloNIDine (CATAPRES - DOSED IN MG/24 HR) 0.1 mg/24hr patch, APPLY 1 PATCH ONTO THE SKIN EVERY WEEK (Patient taking differently: Place 0.1 mg onto the skin once a week.), Disp: 12 patch, Rfl: 0   finasteride (PROSCAR) 5 MG tablet, Take 5 mg by mouth daily., Disp: , Rfl:    fluticasone (FLONASE) 50 MCG/ACT nasal spray, Place 2 sprays into both nostrils daily., Disp: , Rfl:    hydrALAZINE (APRESOLINE) 50 MG tablet, Take 50 mg by mouth 3 (three) times daily., Disp: , Rfl:    HYDROcodone-acetaminophen (NORCO) 10-325 MG tablet, Take 1 tablet by mouth every 6 (six) hours as needed., Disp: , Rfl:    ipratropium (ATROVENT) 0.03 % nasal spray, Place 2 sprays into both nostrils every 12 (twelve) hours., Disp: , Rfl:    levofloxacin (LEVAQUIN) 500 MG tablet, Take 1 tablet (500 mg total) by mouth daily., Disp: 7 tablet, Rfl: 0   Nebivolol HCl (BYSTOLIC) 20 MG TABS, Take 1 tablet (20 mg total) by mouth daily., Disp: 100 tablet, Rfl: 3   pantoprazole (PROTONIX) 40 MG tablet, Take 40 mg by mouth daily., Disp: , Rfl:    spironolactone (ALDACTONE) 25 MG tablet, TAKE 1/2 TABLET(12.5 MG) BY MOUTH EVERY MORNING (Patient taking differently: Take 25 mg by mouth daily.), Disp: 45 tablet, Rfl: 3   tamsulosin (FLOMAX) 0.4 MG CAPS capsule, Take 0.4 mg by mouth., Disp: , Rfl:    ASSESSMENT AND PLAN: .  ICD-10-CM   1. Chronic diastolic heart failure (HCC)  W09.81 EKG 12-Lead    bumetanide (BUMEX) 0.5 MG tablet    Do not attempt resuscitation (DNR)    2. Resistant hypertension  I1A.0 bumetanide (BUMEX) 0.5 MG tablet    Do not attempt resuscitation (DNR)    3. Stage 3b chronic kidney disease (HCC)  N18.32 Do not attempt resuscitation (DNR)    4. Acute deep vein thrombosis (DVT) of proximal vein of right  lower extremity (HCC)  I82.4Y1     5. Benign hypertensive heart and CKD, stage 3 (GFR 30-59), w CHF (HCC)  I13.0    N18.30       1. Chronic diastolic heart failure [facility]) Patient presents for 17-month follow-up visit of hypertension and chronic diastolic heart failure, fortunately he has not had any recent hospitalization.  Denies dyspnea, lung examination is clear, he has had complete resolution of leg edema.  Although he states that he has lost about 20 to 25 pounds in weight.  He appears to have lost weight, his weight appears to be the same as it was a year ago according to the chart.  He is presently on spironolactone 25 mg daily and hydrochlorothiazide 12.5 mg daily with regard to chronic diastolic heart failure and he has multiple medication allergies and intolerances (14 drugs), not on ACE inhibitors or ARB due to cough.  2. Resistant hypertension In spite of resistant hypertension, blood pressure was relatively well-controlled.  Patient has been out of his medications for the past 3 days.  His medications were reconciled today.  Will discontinue hydrochlorothiazide 12.5 mg daily and switch him to bumetanide 0.5 mg daily.  Hopefully this will improve his blood pressure control as well.  He needs office visit in 6 to 8 weeks for follow-up of his hypertension and his medication reconciliation.  He does have stage IIIb chronic kidney disease that limits increasing his spironolactone dose.  Previously his blood pressure was well-controlled with BiDil hence addition of isosorbide dinitrate 30 mg 3 times daily to hydralazine 50 mg 3 times daily would be appropriate.  Patient's daughter-in-law who was present also states that he is noted at mild dementia and has stopped driving due to vision disturbances probably hypertensive retinopathy.  3. Stage 3b chronic kidney disease (HCC) Dictated above.  4. Acute deep vein thrombosis (DVT) of proximal vein of right lower extremity (HCC) Patient developed  DVT of the right lower extremity with extensive DVT involving his femoral vein, fortunately has resolved and has only trace leg edema.  I discussed with the patient regarding end-of-life concerns, patient's worsening gradual cognitive dysfunction, vision loss, markedly elevated blood pressure all poses somewhat marked increased risk for cardiovascular death including stroke and MI.  Patient does not want to be aggressively treated, does not want CPR or intubation.  Will document this in his chart, there will hold further discussion with his wife and family, he may need MOST form filled as well.  DNR order placed.  This is additional 15 minutes of end-of-life discussion in addition to his office visit which is fairly complex in managing his medications and discussion regarding uncontrolled hypertension and lifestyle modification.      Signed,  Yates Decamp, MD, [facility] 07/09/2023, 8:31 PM [facility] [address] [place], [place] [address] Phone: [phone]. Fax:  [phone]

## 2023-07-09 NOTE — Patient Instructions (Addendum)
Medication Instructions:  Your physician has recommended you make the following change in your medication:  Stop hydrochlorothiazide Start Bumex 0.5 mg by mouth daily  *If you need a refill on your cardiac medications before your next appointment, please call your pharmacy*   Lab Work: none If you have labs (blood work) drawn today and your tests are completely normal, you will receive your results only by: MyChart Message (if you have MyChart) OR A paper copy in the mail If you have any lab test that is abnormal or we need to change your treatment, we will call you to review the results.   Testing/Procedures: none   Follow-Up: At [facility], you and your health needs are our priority.  As part of our continuing mission to provide you with exceptional heart care, we have created designated Provider Care Teams.  These Care Teams include your primary Cardiologist (physician) and Advanced Practice Providers (APPs -  Physician Assistants and Nurse Practitioners) who all work together to provide you with the care you need, when you need it.  We recommend signing up for the patient portal called "MyChart".  Sign up information is provided on this After Visit Summary.  MyChart is used to connect with patients for Virtual Visits (Telemedicine).  Patients are able to view lab/test results, encounter notes, upcoming appointments, etc.  Non-urgent messages can be sent to your provider as well.   To learn more about what you can do with MyChart, go to [email or web address].    Your next appointment:   2 month(s)  Provider:   Yates Decamp, MD or APP    Other Instructions

## 2023-07-10 DIAGNOSIS — N281 Cyst of kidney, acquired: Secondary | ICD-10-CM | POA: Diagnosis not present

## 2023-07-10 DIAGNOSIS — N1832 Chronic kidney disease, stage 3b: Secondary | ICD-10-CM | POA: Diagnosis not present

## 2023-07-10 DIAGNOSIS — Z125 Encounter for screening for malignant neoplasm of prostate: Secondary | ICD-10-CM | POA: Diagnosis not present

## 2023-07-10 DIAGNOSIS — R946 Abnormal results of thyroid function studies: Secondary | ICD-10-CM | POA: Diagnosis not present

## 2023-07-10 DIAGNOSIS — I129 Hypertensive chronic kidney disease with stage 1 through stage 4 chronic kidney disease, or unspecified chronic kidney disease: Secondary | ICD-10-CM | POA: Diagnosis not present

## 2023-07-10 DIAGNOSIS — E7800 Pure hypercholesterolemia, unspecified: Secondary | ICD-10-CM | POA: Diagnosis not present

## 2023-07-10 DIAGNOSIS — I2510 Atherosclerotic heart disease of native coronary artery without angina pectoris: Secondary | ICD-10-CM | POA: Diagnosis not present

## 2023-07-10 DIAGNOSIS — M109 Gout, unspecified: Secondary | ICD-10-CM | POA: Diagnosis not present

## 2023-07-10 DIAGNOSIS — I5032 Chronic diastolic (congestive) heart failure: Secondary | ICD-10-CM | POA: Diagnosis not present

## 2023-07-17 ENCOUNTER — Telehealth: Payer: Self-pay | Admitting: Cardiology

## 2023-07-17 DIAGNOSIS — I5032 Chronic diastolic (congestive) heart failure: Secondary | ICD-10-CM

## 2023-07-17 NOTE — Telephone Encounter (Signed)
 Spoke with patient and he states since started bumetanide he feels it has caused him to go to the bathroom a lot. He states he wanted something to help lower his NP not send him to the bathroom.Informed patient that bumetanide is a diuretic. It treats fluid retention and his elevated BP.  Will cause him to go to the bathroom more frequently. That mean its doing its job.  He also feel its causing muscle cramps in his legs. He did not take it today and does not have lleg cramps but his BP was elevated. He states he will try it a little longer.

## 2023-07-17 NOTE — Telephone Encounter (Signed)
 Pt c/o medication issue:  1. Name of Medication: bumetanide (BUMEX) 0.5 MG tablet   2. How are you currently taking this medication (dosage and times per day)? As written  3. Are you having a reaction (difficulty breathing--STAT)? no  4. What is your medication issue? Pt is having cramps in legs and feels it is this medication.

## 2023-07-21 NOTE — Telephone Encounter (Signed)
 His BP is related to excessive fluid built up and until all the fluid is gone, no matter I will not be able to control his BP. Also his diet is very poor and has excess salt. Please order BMP, BNP and Mg. Use Chronic diastolic heart failure

## 2023-07-22 NOTE — Telephone Encounter (Signed)
 Left message to call office

## 2023-07-29 NOTE — Telephone Encounter (Signed)
 I spoke with patient and gave him message from Dr Jacinto Halim.  Patient will go to [facility] on the first floor in the next day or 2 to have lab work checked.

## 2023-08-01 DIAGNOSIS — E7800 Pure hypercholesterolemia, unspecified: Secondary | ICD-10-CM | POA: Diagnosis not present

## 2023-08-01 DIAGNOSIS — R739 Hyperglycemia, unspecified: Secondary | ICD-10-CM | POA: Diagnosis not present

## 2023-08-01 DIAGNOSIS — I5032 Chronic diastolic (congestive) heart failure: Secondary | ICD-10-CM | POA: Diagnosis not present

## 2023-08-01 DIAGNOSIS — N400 Enlarged prostate without lower urinary tract symptoms: Secondary | ICD-10-CM | POA: Diagnosis not present

## 2023-08-01 DIAGNOSIS — I129 Hypertensive chronic kidney disease with stage 1 through stage 4 chronic kidney disease, or unspecified chronic kidney disease: Secondary | ICD-10-CM | POA: Diagnosis not present

## 2023-08-01 DIAGNOSIS — I2510 Atherosclerotic heart disease of native coronary artery without angina pectoris: Secondary | ICD-10-CM | POA: Diagnosis not present

## 2023-08-01 DIAGNOSIS — R251 Tremor, unspecified: Secondary | ICD-10-CM | POA: Diagnosis not present

## 2023-08-01 DIAGNOSIS — M159 Polyosteoarthritis, unspecified: Secondary | ICD-10-CM | POA: Diagnosis not present

## 2023-08-01 DIAGNOSIS — M51362 Other intervertebral disc degeneration, lumbar region with discogenic back pain and lower extremity pain: Secondary | ICD-10-CM | POA: Diagnosis not present

## 2023-08-01 DIAGNOSIS — D72829 Elevated white blood cell count, unspecified: Secondary | ICD-10-CM | POA: Diagnosis not present

## 2023-08-01 DIAGNOSIS — R946 Abnormal results of thyroid function studies: Secondary | ICD-10-CM | POA: Diagnosis not present

## 2023-08-01 DIAGNOSIS — N1831 Chronic kidney disease, stage 3a: Secondary | ICD-10-CM | POA: Diagnosis not present

## 2023-08-01 LAB — COMPREHENSIVE METABOLIC PANEL: EGFR: 39

## 2023-08-02 LAB — BASIC METABOLIC PANEL
BUN/Creatinine Ratio: 15 (ref 10–24)
BUN: 26 mg/dL (ref 8–27)
CO2: 24 mmol/L (ref 20–29)
Calcium: 9.1 mg/dL (ref 8.6–10.2)
Chloride: 104 mmol/L (ref 96–106)
Creatinine, Ser: 1.74 mg/dL — ABNORMAL HIGH (ref 0.76–1.27)
Glucose: 87 mg/dL (ref 70–99)
Potassium: 4.2 mmol/L (ref 3.5–5.2)
Sodium: 143 mmol/L (ref 134–144)
eGFR: 38 mL/min/{1.73_m2} — ABNORMAL LOW (ref >59)

## 2023-08-02 LAB — MAGNESIUM: Magnesium: 2.1 mg/dL (ref 1.6–2.3)

## 2023-08-02 LAB — PRO B NATRIURETIC PEPTIDE: NT-Pro BNP: 4712 pg/mL — ABNORMAL HIGH (ref 0–486)

## 2023-08-02 NOTE — Progress Notes (Signed)
 As discussed previously, his elevated BP and leg edema is related to poor diet and congestive heart failure. He should continue with the medication changes I made

## 2023-08-05 DIAGNOSIS — H43823 Vitreomacular adhesion, bilateral: Secondary | ICD-10-CM | POA: Diagnosis not present

## 2023-08-05 DIAGNOSIS — H353123 Nonexudative age-related macular degeneration, left eye, advanced atrophic without subfoveal involvement: Secondary | ICD-10-CM | POA: Diagnosis not present

## 2023-08-05 DIAGNOSIS — H34231 Retinal artery branch occlusion, right eye: Secondary | ICD-10-CM | POA: Diagnosis not present

## 2023-08-05 DIAGNOSIS — Z961 Presence of intraocular lens: Secondary | ICD-10-CM | POA: Diagnosis not present

## 2023-08-05 DIAGNOSIS — H35033 Hypertensive retinopathy, bilateral: Secondary | ICD-10-CM | POA: Diagnosis not present

## 2023-08-05 DIAGNOSIS — H353211 Exudative age-related macular degeneration, right eye, with active choroidal neovascularization: Secondary | ICD-10-CM | POA: Diagnosis not present

## 2023-08-05 NOTE — Telephone Encounter (Signed)
 Spoke with patient and he is aware of provider message. Verbalized understanding

## 2023-08-05 NOTE — Telephone Encounter (Signed)
 Pt returning call, requesting cb

## 2023-08-06 NOTE — Progress Notes (Signed)
 PCP labs 08/01/2023:  Serum glucose 84 mg, BUN 25, creatinine 1.70, EGFR 39 mL, potassium 4.3, LFTs normal.

## 2023-08-22 DIAGNOSIS — H353131 Nonexudative age-related macular degeneration, bilateral, early dry stage: Secondary | ICD-10-CM | POA: Diagnosis not present

## 2023-08-22 DIAGNOSIS — H4720 Unspecified optic atrophy: Secondary | ICD-10-CM | POA: Diagnosis not present

## 2023-08-26 ENCOUNTER — Other Ambulatory Visit: Payer: Self-pay | Admitting: Cardiology

## 2023-08-26 DIAGNOSIS — I1A0 Resistant hypertension: Secondary | ICD-10-CM

## 2023-09-10 ENCOUNTER — Ambulatory Visit: Payer: Medicare Other | Admitting: Nurse Practitioner

## 2023-09-11 NOTE — Progress Notes (Unsigned)
 Cardiology Office Note    Patient Name: Keith [name] Date of Encounter: 09/11/2023  Primary Care Provider:  Pete Brand, DO Primary Cardiologist:  Keith Perl, MD Primary Electrophysiologist: None   Past Medical History    Past Medical History:  Diagnosis Date   Abnormal nuclear stress test 02/24/2010   Nuclear stress 02/24/10 Infero lateral ischemia     Allergic rhinitis, cause unspecified    skin test pos 02-23-2008   Angina pectoris (HCC)    CAD (coronary artery disease)    CKD (chronic kidney disease) stage 3, GFR 30-59 ml/min (HCC) 07/23/2018   Dyspnea on exertion 07/23/2018   GERD (gastroesophageal reflux disease)    Hypercholesteremia 07/23/2018   Kidney stone    Migraine    Nephrolithiasis    Resistant hypertension 07/23/2018   TIA (transient ischemic attack)     History of Present Illness  Keith [name] is a 83 y.o. male with a PMH of HFpEF, CAD, HTN, HLD, CKD stage III, unprovoked DVT (on Eliquis ) who presents today for 30-month follow-up.  [name] has been followed by Keith [name] since 2020 for management of HTN and CAD.  He was noted to have uncontrolled BP and dyspnea on exertion.  He completed 2D echo that showed moderately decreased EF of 35 as 40% with grade 1 DD.  He underwent Lexiscan  Myoview  for further evaluation that was intermediate risk with a EF of 43% and possible ischemia versus abdominal attenuation.  He was started on GDMT with isosorbide  dinitrate and hydralazine  with improvement to BP.  Repeat 2D echo on 06/29/2021 showed improvement to EF of 50-55% with grade 1 DD.  He had further adjustments made to BP medications with control obtained.  He had complaint of RLE swelling and tenderness and underwent ultrasound that showed iliofemoral DVT.  He was evaluated by Dr. Shaunna [name] in the DVT clinic and was started on Eliquis  10 mg twice daily x 7 days and then 5 mg daily and was discontinued after 3 months.  He was last seen by Keith [name] in office on 07/09/2023.   He reported being out of his medications for 3 days with BP during visit elevated at 190/109.  He also reported losing 20 to 25 pounds due to decreased appetite.  He had HCTZ discontinued and was switched to bumetanide  0.5 mg and was continued on clonidine  patch, hydralazine , Bystolic  and spironolactone .  He was advised to start ASA 81 mg daily.  Patient denies chest pain, palpitations, dyspnea, PND, orthopnea, nausea, vomiting, dizziness, syncope, edema, weight gain, or early satiety.   Discussed the use of AI scribe software for clinical note transcription with the patient, who gave verbal consent to proceed.  History of Present Illness    ***Notes: -Last ischemic evaluation:  Review of Systems  Please see the history of present illness.    All other systems reviewed and are otherwise negative except as noted above.  Physical Exam    Wt Readings from Last 3 Encounters:  05/25/23 195 lb (88.5 kg)  03/19/23 195 lb (88.5 kg)  06/21/22 202 lb (91.6 kg)   WU:[id number] were no vitals filed for this visit.,There is no height or weight on file to calculate BMI. GEN: Well nourished, well developed in no acute distress Neck: No JVD; No carotid bruits Pulmonary: Clear to auscultation without rales, wheezing or rhonchi  Cardiovascular: Normal rate. Regular rhythm. Normal S1. Normal S2.   Murmurs: There is no murmur.  ABDOMEN: Soft, non-tender, non-distended EXTREMITIES:  No edema;  No deformity   EKG/LABS/ Recent Cardiac Studies   ECG personally reviewed by me today - ***  Risk Assessment/Calculations:   {Does this patient have ATRIAL FIBRILLATION?:[phone]}      Lab Results  Component Value Date   WBC 13.4 (H) 12/14/2022   HGB 14.6 12/14/2022   HCT 45.7 12/14/2022   MCV 90.0 12/14/2022   PLT 122 (L) 12/14/2022   Lab Results  Component Value Date   CREATININE 1.74 (H) 08/01/2023   BUN 26 08/01/2023   NA 143 08/01/2023   K 4.2 08/01/2023   CL 104 08/01/2023   CO2 24  08/01/2023   No results found for: "CHOL", "HDL", "LDLCALC", "LDLDIRECT", "TRIG", "CHOLHDL"  No results found for: "HGBA1C" Assessment & Plan    1.  Resistant HTN  2.  HFpEF  3.  CKD stage IIIb  4.  History of DVT      Disposition: Follow-up with Keith Perl, MD or APP in *** months {Are you ordering a CV Procedure (e.g. stress test, cath, DCCV, TEE, etc)?   Press F2        :[id number]}   Signed, Keith [name], Keith Cast, NP 09/11/2023, 10:24 AM [facility]

## 2023-09-12 ENCOUNTER — Ambulatory Visit: Payer: Medicare Other | Attending: Nurse Practitioner | Admitting: Nurse Practitioner

## 2023-09-12 ENCOUNTER — Encounter: Payer: Self-pay | Admitting: Nurse Practitioner

## 2023-09-12 VITALS — BP 140/90 | HR 67 | Ht 66.0 in | Wt 193.2 lb

## 2023-09-12 DIAGNOSIS — I5032 Chronic diastolic (congestive) heart failure: Secondary | ICD-10-CM | POA: Diagnosis not present

## 2023-09-12 DIAGNOSIS — N1832 Chronic kidney disease, stage 3b: Secondary | ICD-10-CM | POA: Insufficient documentation

## 2023-09-12 DIAGNOSIS — I824Y1 Acute embolism and thrombosis of unspecified deep veins of right proximal lower extremity: Secondary | ICD-10-CM | POA: Insufficient documentation

## 2023-09-12 DIAGNOSIS — I1A0 Resistant hypertension: Secondary | ICD-10-CM | POA: Insufficient documentation

## 2023-09-12 NOTE — Patient Instructions (Signed)
 Medication Instructions:  Your physician recommends that you continue on your current medications as directed. Please refer to the Current Medication list given to you today. *If you need a refill on your cardiac medications before your next appointment, please call your pharmacy*  Lab Work: TODAY-BMET If you have labs (blood work) drawn today and your tests are completely normal, you will receive your results only by: MyChart Message (if you have MyChart) OR A paper copy in the mail If you have any lab test that is abnormal or we need to change your treatment, we will call you to review the results.  Testing/Procedures: NONE ORDERED  Follow-Up: At [facility], you and your health needs are our priority.  As part of our continuing mission to provide you with exceptional heart care, our providers are all part of one team.  This team includes your primary Cardiologist (physician) and Advanced Practice Providers or APPs (Physician Assistants and Nurse Practitioners) who all work together to provide you with the care you need, when you need it.  Your next appointment:   3 month(s)  Provider:   Knox Perl, MD     We recommend signing up for the patient portal called "MyChart".  Sign up information is provided on this After Visit Summary.  MyChart is used to connect with patients for Virtual Visits (Telemedicine).  Patients are able to view lab/test results, encounter notes, upcoming appointments, etc.  Non-urgent messages can be sent to your provider as well.   To learn more about what you can do with MyChart, go to [email or web address].   Other Instructions:  Please check your weight daily. Please contact the office if you gain more than 2lbs in a day or 5lbs in a week.  Limit your salt intake to 1500-2000mg  per day or 500mg  of Sodium per meal.  Check your blood pressure daily for 2 weeks, then contact the office with your readings.  Contact the office either by phone or  MyChart with your readings.  Make sure to check your blood pressure 2 hours after taking your medications.   AVOID these things for 30 minutes before checking your blood pressure: No Drinking caffeine. No Drinking alcohol. No Eating. No Smoking. No Exercising.  Five minutes before checking your blood pressure: Pee. Sit in a dining chair. Avoid sitting in a soft couch or armchair. Be quiet. Do not talk.       1st Floor: - Lobby - Registration  - Pharmacy  - Lab - Cafe  2nd Floor: - PV Lab - Diagnostic Testing (echo, CT, nuclear med)  3rd Floor: - Vacant  4th Floor: - TCTS (cardiothoracic surgery) - AFib Clinic - Structural Heart Clinic - Vascular Surgery  - Vascular Ultrasound  5th Floor: - HeartCare Cardiology (general and EP) - Clinical Pharmacy for coumadin, hypertension, lipid, weight-loss medications, and med management appointments    Valet parking services will be available as well.

## 2023-09-13 LAB — BASIC METABOLIC PANEL WITH GFR
BUN/Creatinine Ratio: 16 (ref 10–24)
BUN: 24 mg/dL (ref 8–27)
CO2: 26 mmol/L (ref 20–29)
Calcium: 9.3 mg/dL (ref 8.6–10.2)
Chloride: 104 mmol/L (ref 96–106)
Creatinine, Ser: 1.51 mg/dL — ABNORMAL HIGH (ref 0.76–1.27)
Glucose: 86 mg/dL (ref 70–99)
Potassium: 4.2 mmol/L (ref 3.5–5.2)
Sodium: 145 mmol/L — ABNORMAL HIGH (ref 134–144)
eGFR: 45 mL/min/{1.73_m2} — ABNORMAL LOW (ref >59)

## 2023-10-01 DIAGNOSIS — H353211 Exudative age-related macular degeneration, right eye, with active choroidal neovascularization: Secondary | ICD-10-CM | POA: Diagnosis not present

## 2023-10-16 ENCOUNTER — Other Ambulatory Visit: Payer: Self-pay | Admitting: Cardiology

## 2023-10-16 DIAGNOSIS — E7800 Pure hypercholesterolemia, unspecified: Secondary | ICD-10-CM

## 2023-10-18 DIAGNOSIS — H353123 Nonexudative age-related macular degeneration, left eye, advanced atrophic without subfoveal involvement: Secondary | ICD-10-CM | POA: Diagnosis not present

## 2023-11-25 ENCOUNTER — Ambulatory Visit: Admitting: Diagnostic Neuroimaging

## 2023-11-26 DIAGNOSIS — H353211 Exudative age-related macular degeneration, right eye, with active choroidal neovascularization: Secondary | ICD-10-CM | POA: Diagnosis not present

## 2023-11-26 DIAGNOSIS — H35033 Hypertensive retinopathy, bilateral: Secondary | ICD-10-CM | POA: Diagnosis not present

## 2023-11-26 DIAGNOSIS — H34231 Retinal artery branch occlusion, right eye: Secondary | ICD-10-CM | POA: Diagnosis not present

## 2023-11-26 DIAGNOSIS — H43823 Vitreomacular adhesion, bilateral: Secondary | ICD-10-CM | POA: Diagnosis not present

## 2023-11-26 DIAGNOSIS — Z961 Presence of intraocular lens: Secondary | ICD-10-CM | POA: Diagnosis not present

## 2023-11-26 DIAGNOSIS — H353123 Nonexudative age-related macular degeneration, left eye, advanced atrophic without subfoveal involvement: Secondary | ICD-10-CM | POA: Diagnosis not present

## 2023-12-09 DIAGNOSIS — M25512 Pain in left shoulder: Secondary | ICD-10-CM | POA: Diagnosis not present

## 2023-12-19 ENCOUNTER — Telehealth: Payer: Self-pay | Admitting: *Deleted

## 2023-12-19 ENCOUNTER — Encounter: Payer: Self-pay | Admitting: Cardiology

## 2023-12-19 ENCOUNTER — Other Ambulatory Visit (HOSPITAL_COMMUNITY): Payer: Self-pay

## 2023-12-19 ENCOUNTER — Ambulatory Visit: Attending: Cardiology | Admitting: Cardiology

## 2023-12-19 VITALS — BP 130/80 | HR 91 | Resp 16 | Ht 66.0 in | Wt 194.6 lb

## 2023-12-19 DIAGNOSIS — I5032 Chronic diastolic (congestive) heart failure: Secondary | ICD-10-CM | POA: Insufficient documentation

## 2023-12-19 DIAGNOSIS — I4891 Unspecified atrial fibrillation: Secondary | ICD-10-CM | POA: Diagnosis not present

## 2023-12-19 DIAGNOSIS — N1831 Chronic kidney disease, stage 3a: Secondary | ICD-10-CM | POA: Diagnosis not present

## 2023-12-19 DIAGNOSIS — I1A0 Resistant hypertension: Secondary | ICD-10-CM | POA: Diagnosis not present

## 2023-12-19 MED ORDER — APIXABAN 5 MG PO TABS
5.0000 mg | ORAL_TABLET | Freq: Two times a day (BID) | ORAL | 3 refills | Status: DC
Start: 2023-12-19 — End: 2023-12-20
  Filled 2023-12-19: qty 180, 90d supply, fill #0

## 2023-12-19 MED ORDER — APIXABAN 5 MG PO TABS: 5.0000 mg | ORAL_TABLET | Freq: Two times a day (BID) | ORAL | 0 refills | Status: DC

## 2023-12-19 NOTE — Telephone Encounter (Signed)
 Eliquis  5 mg by mouth twice daily for 28 days samples is ready for a patient to pick up.

## 2023-12-19 NOTE — Patient Instructions (Signed)
 Medication Instructions:  Stop aspirin Start Eliquis  5 mg by mouth twice daily *If you need a refill on your cardiac medications before your next appointment, please call your pharmacy*  Lab Work: Have lab work checked today in the lab on the first floor--CBC and BMP If you have labs (blood work) drawn today and your tests are completely normal, you will receive your results only by: MyChart Message (if you have MyChart) OR A paper copy in the mail If you have any lab test that is abnormal or we need to change your treatment, we will call you to review the results.  Testing/Procedures: Your physician has requested that you have an echocardiogram. Echocardiography is a painless test that uses sound waves to create images of your heart. It provides your doctor with information about the size and shape of your heart and how well your heart's chambers and valves are working. This procedure takes approximately one hour. There are no restrictions for this procedure. Please do NOT wear cologne, perfume, aftershave, or lotions (deodorant is allowed). Please arrive 15 minutes prior to your appointment time.  Please note: We ask at that you not bring children with you during ultrasound (echo/ vascular) testing. Due to room size and safety concerns, children are not allowed in the ultrasound rooms during exams. Our front office staff cannot provide observation of children in our lobby area while testing is being conducted. An adult accompanying a patient to their appointment will only be allowed in the ultrasound room at the discretion of the ultrasound technician under special circumstances. We apologize for any inconvenience.  Your physician has recommended that you have a Cardioversion (DCCV). Electrical Cardioversion uses a jolt of electricity to your heart either through paddles or wired patches attached to your chest. This is a controlled, usually prescheduled, procedure. Defibrillation is done under  light anesthesia in the hospital, and you usually go home the day of the procedure. This is done to get your heart back into a normal rhythm. You are not awake for the procedure. Please see the instruction sheet given to you today. Scheduled for August 28   Follow-Up: At [facility], you and your health needs are our priority.  As part of our continuing mission to provide you with exceptional heart care, our providers are all part of one team.  This team includes your primary Cardiologist (physician) and Advanced Practice Providers or APPs (Physician Assistants and Nurse Practitioners) who all work together to provide you with the care you need, when you need it.  Your next appointment:   October 23  Provider:   Jackee Alberts, NP.   We recommend signing up for the patient portal called MyChart.  Sign up information is provided on this After Visit Summary.  MyChart is used to connect with patients for Virtual Visits (Telemedicine).  Patients are able to view lab/test results, encounter notes, upcoming appointments, etc.  Non-urgent messages can be sent to your provider as well.   To learn more about what you can do with MyChart, go to [email or web address].   Other Instructions    Dear Tanda LABOR Gritz  You are scheduled for a Cardioversion on Thursday, August 28 with Dr. Raford.  Please arrive at the [facility] (Main Entrance A) at [facility]: [address] [place], [place] [address] at 8:00 AM (This time is 1 hour(s) before your procedure to ensure your preparation).   Free valet parking service is available. You will check in at ADMITTING.   *  Please Note: You will receive a call the day before your procedure to confirm the appointment time. That time may have changed from the original time based on the schedule for that day.*    DIET:  Nothing to eat or drink after midnight except a sip of water with medications (see medication instructions below)  MEDICATION  INSTRUCTIONS: !!IF ANY NEW MEDICATIONS ARE STARTED AFTER TODAY, PLEASE NOTIFY YOUR PROVIDER AS SOON AS POSSIBLE!!  FYI: Medications such as Semaglutide (Ozempic, [place]), Tirzepatide (Mounjaro, Zepbound), Dulaglutide (Trulicity), etc (GLP1 agonists) AND Canagliflozin (Invokana), Dapagliflozin (Farxiga), Empagliflozin (Jardiance), Ertugliflozin (Steglatro), Bexagliflozin [employer]) or any combination with one of these drugs such as Invokamet (Canagliflozin/Metformin), Synjardy (Empagliflozin/Metformin), etc (SGLT2 inhibitors) must be held around the time of a procedure. This is not a comprehensive list of all of these drugs. Please review all of your medications and talk to your provider if you take any one of these. If you are not sure, ask your provider.    Continue taking your anticoagulant (blood thinner): Apixaban  (Eliquis ).  You will need to continue this after your procedure until you are told by your provider that it is safe to stop.    LABS: done on July 31 FYI:  For your safety, and to allow us  to monitor your vital signs accurately during the surgery/procedure we request: If you have artificial nails, gel coating, SNS etc, please have those removed prior to your surgery/procedure. Not having the nail coverings /polish removed may result in cancellation or delay of your surgery/procedure.  Your support person will be asked to wait in the waiting room during your procedure.  It is OK to have someone drop you off and come back when you are ready to be discharged.  You cannot drive after the procedure and will need someone to drive you home.  Bring your insurance cards.  *Special Note: Every effort is made to have your procedure done on time. Occasionally there are emergencies that occur at the hospital that may cause delays. Please be patient if a delay does occur.

## 2023-12-19 NOTE — Telephone Encounter (Signed)
 Medication name/dosage: Samples List: Eliquis  5 mg  Administration instructions:  Take one tablet by mouth twice daily   Reason for samples: Reason for samples: new start  Ordering provider: Dr Ladona  *Once above information entered, route the phone encounter to CV DIV MAG ST SAMPLES and send Teams message to team member assigned to Samples for the day.

## 2023-12-19 NOTE — Progress Notes (Signed)
 Cardiology Office Note:  .   Date:  12/20/2023  ID:  Keith [name], DOB April 18, 1941, MRN [id number] PCP: Gerome Brunet, DO  [facility] Providers Cardiologist:  Gordy Bergamo, MD   History of Present Illness: .   Keith [name] is a 83 y.o.  Caucasian male with resistant hypertension, chronic diastolic heart failure, stage III chronic kidney disease, gout, renal stones, hyperlipidemia, history of DVT in July 2024 right lower extremity extending from common femoral vein and leg edema resolved after being on anticoagulation, recent continued weight loss due to lack of appetite, no other etiology found, presents here for follow-up of resistant hypertension.  He is presently on Bumex  0.5 mg daily, clonidine  0.1 mg patch q. weekly, Bystolic  20 mg daily and spironolactone  25 mg 1/2 tablet in the morning along with tamsulosin 0.4 mg in the evening for BPH.  He was restarted back on hydralazine  25 mg 3 times daily to be taken on a as needed basis if blood pressure is not well-controlled.  He presents for a 23-month office visit.  He has had a nonischemic low risk to intermediate risk stress test on 08/25/2018 with EF 43%, echocardiogram on 06/29/2021 revealed normal LVEF at 50 to 55% without significant valvular abnormality however EF had improved from 35 to 40% compared to 02/01/2020.  Discussed the use of AI scribe software for clinical note transcription with the patient, who gave verbal consent to proceed.  History of Present Illness Keith [name] is an 83 year old male with atrial fibrillation who presents for cardiovascular evaluation.  He experienced a fall two to three weeks ago without fractures, resulting in a bruised left arm. He has a history of blood clots and was previously on Eliquis , which was discontinued. He is currently taking aspirin.  He reports ongoing weight loss, currently weighing 94 pounds, which has improved his leg swelling and blood pressure control. His current blood  pressure medications include clonidine  0.1 mg patch, Bystolic  20 mg once daily, and spironolactone  25 mg (half tablet daily). He changes his clonidine  patch every seven days.  He mentions developing mild Parkinson's tremors and has an upcoming appointment with a neurologist next month.  Labs   Lab Results  Component Value Date   NA 142 12/19/2023   K 4.5 12/19/2023   CO2 22 12/19/2023   GLUCOSE 92 12/19/2023   BUN 25 12/19/2023   CREATININE 1.87 (H) 12/19/2023   CALCIUM  8.7 12/19/2023   EGFR 35 (L) 12/19/2023   GFRNONAA 36 (L) 12/14/2022      Latest Ref Rng & Units 12/19/2023    1:34 PM 09/12/2023    1:25 PM 08/01/2023   12:54 PM  BMP  Glucose 70 - 99 mg/dL 92  86  87   BUN 8 - 27 mg/dL 25  24  26    Creatinine 0.76 - 1.27 mg/dL 8.12  8.48  8.25   BUN/Creat Ratio 10 - 24 13  16  15    Sodium 134 - 144 mmol/L 142  145  143   Potassium 3.5 - 5.2 mmol/L 4.5  4.2  4.2   Chloride 96 - 106 mmol/L 102  104  104   CO2 20 - 29 mmol/L 22  26  24    Calcium  8.6 - 10.2 mg/dL 8.7  9.3  9.1       Latest Ref Rng & Units 12/19/2023    1:34 PM 12/14/2022    1:25 PM 08/07/2021   12:55 PM  CBC  WBC 3.4 - 10.8 x10E3/uL 15.2  13.4  14.0   Hemoglobin 13.0 - 17.7 g/dL 86.2  85.3  83.8   Hematocrit 37.5 - 51.0 % 43.6  45.7  48.7   Platelets 150 - 450 x10E3/uL 284  122  153     External Labs:  Care everywhere labs 08/01/2023:  Total cholesterol 182, triglycerides 91, HDL 49, LDL 116.  Hb 14.9/HCT 46.1, platelets 163, normal indicis.  Serum glucose 112 mg, BUN 28, creatinine 1.40, EGFR 50 mL, potassium 3.9, LFTs normal.  TSH normal at 3.470.  ROS  Review of Systems  Cardiovascular:  Negative for chest pain, dyspnea on exertion and leg swelling.   Physical Exam:   VS:  BP 130/80 (BP Location: Left Arm, Patient Position: Sitting, Cuff Size: Large)   Pulse 91   Resp 16   Ht 5' 6 (1.676 m)   Wt 194 lb 9.6 oz (88.3 kg)   SpO2 95%   BMI 31.41 kg/m    Wt Readings from Last 3 Encounters:   12/19/23 194 lb 9.6 oz (88.3 kg)  09/12/23 193 lb 3.2 oz (87.6 kg)  05/25/23 195 lb (88.5 kg)    Physical Exam Neck:     Vascular: No carotid bruit or JVD.  Cardiovascular:     Rate and Rhythm: Normal rate. Rhythm irregular.     Pulses: Normal pulses.     Heart sounds: Murmur heard.     Early systolic murmur is present with a grade of 2/6 at the upper right sternal border.  Pulmonary:     Effort: Pulmonary effort is normal.     Breath sounds: Normal breath sounds.  Abdominal:     General: Bowel sounds are normal.     Palpations: Abdomen is soft.  Musculoskeletal:     Right lower leg: No edema.     Left lower leg: No edema.  Skin:    Capillary Refill: Capillary refill takes less than 2 seconds.    Studies Reviewed: SABRA     EKG:    EKG Interpretation Date/Time:  Thursday December 19 2023 12:37:54 EDT Ventricular Rate:  87 PR Interval:    QRS Duration:  102 QT Interval:  400 QTC Calculation: 481 R Axis:   -38  Text Interpretation: EKG 12/19/2023: Atrial fibrillation with controlled ventricular response at the rate of 87 bpm, left anterior fascicular block.  Poor R wave progression, cannot exclude septal infarct old.  Nonspecific T abnormality.  PVCs (3).   Compared to 07/09/2023, atrial fibrillation is new. Confirmed by Omolara Carol, Jagadeesh ([id number]) on 12/19/2023 12:53:13 PM    Medications ordered    Meds ordered this encounter  Medications   apixaban  (ELIQUIS ) 5 MG TABS tablet    Sig: Take 1 tablet (5 mg total) by mouth 2 (two) times daily.    Dispense:  180 tablet    Refill:  3     ASSESSMENT AND PLAN: .      ICD-10-CM   1. New onset atrial fibrillation (HCC)  I48.91 EKG 12-Lead    ECHOCARDIOGRAM COMPLETE    apixaban  (ELIQUIS ) 5 MG TABS tablet    Basic Metabolic Panel (BMET)    CBC    2. Resistant hypertension  I1A.0 CANCELED: EKG 12-Lead    3. Chronic diastolic heart failure (HCC)  P49.67 CANCELED: EKG 12-Lead    4. Stage 3a chronic kidney disease (HCC)  N18.31       Assessment & Plan Atrial fibrillation with high stroke risk Confirmed atrial fibrillation with  EKG. High risk of stroke due to AFib. Previously on Eliquis , but was discontinued. Needs to be restarted on blood thinner to reduce stroke risk. Plan to perform cardioversion after at least three weeks on blood thinner to attempt to restore normal heart rhythm. Discussed the need for careful monitoring due to increased fall risk while on blood thinners. Cardioversion planned to restore regular heart rhythm, with the understanding that if AFib persists without symptoms, it may be left untreated. - Prescribe Eliquis  5 mg twice daily - Provide Eliquis  samples - Schedule cardioversion after at least three weeks on Eliquis  - Order echocardiogram to assess heart function - Click Here to Calculate/Change CHADS2VASc Score The patient's CHADS2-VASc score is 5, indicating a 7.2% annual risk of stroke.  Therefore, anticoagulation is recommended.   CHF History: Yes HTN History: Yes Diabetes History: No Stroke History: No Vascular Disease History: Yes   Hypertension with hypertensive retinopathy Hypertension is well-controlled with current medication regimen. Blood pressure is 130/80 mmHg. Long-standing hypertension has led to hypertensive retinopathy. Weight loss has contributed to improved blood pressure control. - Continue current antihypertensive medications: clonidine  0.1 mg patch, Bystolic  20 mg daily, spironolactone  25 mg, one half tablet daily, tamsulosin as prescribed  Weight loss Continued weight loss noted, currently weighing 94 lbs. Weight loss has positively impacted blood pressure control and reduced leg swelling.  No etiologies for weight loss has been found however patient admits to lack of appetite.  Hence suspect this is just age-related decreased appetite and weight loss and not abnormal weight loss.  Office visit in 4 months.  Patient's daughter-in-law present and all questions answered.  Schedule for Direct current cardioversion. I have discussed regarding risks benefits rate control vs rhythm control with the patient. Patient understands cardiac arrest and need for CPR, aspiration pneumonia, but not limited to these. Patient is willing.    Addendum: Patient's labs reviewed, labs are stable to proceed with cardioversion.  Also upon review, his serum creatinine is greater than 1.5 and his age >84 hence appropriate dose for Eliquis  would be 2.5 mg p.o. twice daily and not 5 mg twice daily.  We will send 2.5 mg Rx once he has finished with 5 mg Rx.  Will advise patient to split the medication in half  Signed,  Gordy Bergamo, MD, [facility] 12/20/2023, 11:59 AM [facility] [address] [place], [place] [address] Phone: [phone]. Fax:  [phone]

## 2023-12-20 ENCOUNTER — Ambulatory Visit: Payer: Self-pay | Admitting: *Deleted

## 2023-12-20 LAB — BASIC METABOLIC PANEL WITH GFR
BUN/Creatinine Ratio: 13 (ref 10–24)
BUN: 25 mg/dL (ref 8–27)
CO2: 22 mmol/L (ref 20–29)
Calcium: 8.7 mg/dL (ref 8.6–10.2)
Chloride: 102 mmol/L (ref 96–106)
Creatinine, Ser: 1.87 mg/dL — AB (ref 0.76–1.27)
Glucose: 92 mg/dL (ref 70–99)
Potassium: 4.5 mmol/L (ref 3.5–5.2)
Sodium: 142 mmol/L (ref 134–144)
eGFR: 35 mL/min/1.73 — AB (ref >59)

## 2023-12-20 LAB — CBC
Hematocrit: 43.6 % (ref 37.5–51.0)
Hemoglobin: 13.7 g/dL (ref 13.0–17.7)
MCH: 29.5 pg (ref 26.6–33.0)
MCHC: 31.4 g/dL — ABNORMAL LOW (ref 31.5–35.7)
MCV: 94 fL (ref 79–97)
Platelets: 284 x10E3/uL (ref 150–450)
RBC: 4.64 x10E6/uL (ref 4.14–5.80)
RDW: 12.7 % (ref 11.6–15.4)
WBC: 15.2 x10E3/uL — ABNORMAL HIGH (ref 3.4–10.8)

## 2023-12-20 MED ORDER — APIXABAN 2.5 MG PO TABS
2.5000 mg | ORAL_TABLET | Freq: Two times a day (BID) | ORAL | Status: DC
Start: 2023-12-20 — End: 2024-01-24

## 2023-12-20 NOTE — Progress Notes (Signed)
 Patient's labs reviewed, labs are stable to proceed with cardioversion.  Also upon review, his serum creatinine is greater than 1.5 and his age >48 hence appropriate dose for Eliquis  would be 2.5 mg p.o. twice daily and not 5 mg twice daily.  Will advise patient to split the medication in half bid for now and to call us  when they are close to running out of the medication

## 2023-12-20 NOTE — Progress Notes (Signed)
 Medications will not work. Cardioversion as I discussed in detail is very safe and tell them not to worry. However, if they decide not to do this, then we leave him on the present meds and just follow up in 3 months as usual

## 2023-12-20 NOTE — Addendum Note (Signed)
 Addended by: LADONA MILAN on: 12/20/2023 12:02 PM   Modules accepted: Orders

## 2024-01-01 DIAGNOSIS — M25512 Pain in left shoulder: Secondary | ICD-10-CM | POA: Diagnosis not present

## 2024-01-02 ENCOUNTER — Ambulatory Visit (INDEPENDENT_AMBULATORY_CARE_PROVIDER_SITE_OTHER): Admitting: Diagnostic Neuroimaging

## 2024-01-02 ENCOUNTER — Encounter: Payer: Self-pay | Admitting: Diagnostic Neuroimaging

## 2024-01-02 VITALS — BP 128/70 | HR 80 | Ht 66.0 in | Wt 189.8 lb

## 2024-01-02 DIAGNOSIS — G20A1 Parkinson's disease without dyskinesia, without mention of fluctuations: Secondary | ICD-10-CM

## 2024-01-02 MED ORDER — CARBIDOPA-LEVODOPA 25-100 MG PO TABS: 1.0000 | ORAL_TABLET | Freq: Three times a day (TID) | ORAL | 6 refills | Status: AC

## 2024-01-02 NOTE — Progress Notes (Signed)
 [facility]  PATIENT: Keith [name] DOB: Mar 25, 1941  REFERRING CLINICIAN: Gerome Brunet, DO HISTORY FROM: patient  REASON FOR VISIT: new consult   HISTORICAL  CHIEF COMPLAINT:  Chief Complaint  Patient presents with   New Patient (Initial Visit)    RM 7, Pt w/daughter-in-law. Pt referred by PCP for tremors. Pt states tremor in left arm & hand, reports tremor in tongue and bottom lip. Also has a tremor in left foot at times. Reports started about a year ago.    HISTORY OF PRESENT ILLNESS:   83 year old male here for evaluation of tremor.  For past 1 to 2 years, patient has had onset of left hand resting tremor, slight progressing to the left tongue and lip.  Gait and balance have been more difficult over time.  Now having a soft hoarse voice.  He tends to cough when he eats.  He has had several falls injuring his left shoulder recently.  Has been using a cane for the last 2 years and now a walker for last 3 to 4 months.  No family history of tremor.   REVIEW OF SYSTEMS: Full 14 system review of systems performed and negative with exception of: as per HPI.  ALLERGIES: Allergies  Allergen Reactions   Amlodipine Swelling   Labetalol Swelling    Swollen lips    Lisinopril Cough   Penicillins Rash   Allopurinol     Other reaction(s): rash   Amoxicillin-Pot Clavulanate     Other reaction(s): swelling of the neck ad throat   Azithromycin     Other reaction(s): Unknown   Benzonatate     Other reaction(s): Unknown   Cefuroxime     Other reaction(s): facial swelling, rash   Codeine     Other reaction(s): itching, rash   Epinephrine     Other reaction(s): tachycardia   Procaine Hcl     REACTION: increased bp   Valsartan     Other reaction(s): cough   Prednisone Rash    HOME MEDICATIONS: Outpatient Medications Prior to Visit  Medication Sig Dispense Refill   allopurinol (ZYLOPRIM) 100 MG tablet Take 100 mg by mouth as needed.     apixaban  (ELIQUIS )  2.5 MG TABS tablet Take 1 tablet (2.5 mg total) by mouth 2 (two) times daily.     atorvastatin  (LIPITOR) 10 MG tablet TAKE 1 TABLET(10 MG) BY MOUTH DAILY 90 tablet 3   bumetanide  (BUMEX ) 0.5 MG tablet Take 1 tablet (0.5 mg total) by mouth every morning. 90 tablet 1   cloNIDine  (CATAPRES  - DOSED IN MG/24 HR) 0.1 mg/24hr patch APPLY 1 PATCH ONTO THE SKIN EVERY WEEK 12 patch 3   finasteride (PROSCAR) 5 MG tablet Take 5 mg by mouth daily.     HYDROcodone -acetaminophen  (NORCO) 10-325 MG tablet Take 1 tablet by mouth every 6 (six) hours as needed.     meclizine (ANTIVERT) 25 MG tablet Take 25 mg by mouth daily as needed for dizziness.     Nebivolol  HCl (BYSTOLIC ) 20 MG TABS Take 20 mg by mouth daily.     pantoprazole (PROTONIX) 40 MG tablet Take 40 mg by mouth daily.     tamsulosin (FLOMAX) 0.4 MG CAPS capsule Take 0.4 mg by mouth.     ipratropium (ATROVENT) 0.03 % nasal spray Place 2 sprays into both nostrils every 12 (twelve) hours.     Nebivolol  HCl (BYSTOLIC ) 20 MG TABS Take 1 tablet (20 mg total) by mouth daily. 100 tablet 3   spironolactone  (  ALDACTONE ) 25 MG tablet TAKE 1/2 TABLET(12.5 MG) BY MOUTH EVERY MORNING 45 tablet 3   No facility-administered medications prior to visit.    PAST MEDICAL HISTORY: Past Medical History:  Diagnosis Date   Abnormal nuclear stress test 02/24/2010   Nuclear stress 02/24/10 Infero lateral ischemia     Allergic rhinitis, cause unspecified    skin test pos 02-23-2008   Angina pectoris (HCC)    CAD (coronary artery disease)    CKD (chronic kidney disease) stage 3, GFR 30-59 ml/min (HCC) 07/23/2018   Dyspnea on exertion 07/23/2018   GERD (gastroesophageal reflux disease)    Hypercholesteremia 07/23/2018   Kidney stone    Migraine    Nephrolithiasis    Resistant hypertension 07/23/2018   TIA (transient ischemic attack)     PAST SURGICAL HISTORY: Past Surgical History:  Procedure Laterality Date   bilateral tkr     EXTRACORPOREAL SHOCK WAVE  LITHOTRIPSY Right 03/02/2019   Procedure: EXTRACORPOREAL SHOCK WAVE LITHOTRIPSY (ESWL);  Surgeon: Carolee Sherwood JONETTA DOUGLAS, MD;  Location: [facility];  Service: Urology;  Laterality: Right;   HERNIA REPAIR     reset nasal fracture     TONSILLECTOMY      FAMILY HISTORY: Family History  Problem Relation Age of Onset   Allergy (severe) Mother    Heart attack Father    Stroke Father    COPD Brother    Heart attack Brother    Tuberculosis Other        grandmother    SOCIAL HISTORY: Social History   Socioeconomic History   Marital status: Married    Spouse name: Not on file   Number of children: 1   Years of education: Not on file   Highest education level: Not on file  Occupational History   Occupation: retired   Occupation: works part Manufacturing engineer  Tobacco Use   Smoking status: Never   Smokeless tobacco: Never  Vaping Use   Vaping status: Never Used  Substance and Sexual Activity   Alcohol use: No    Alcohol/week: 0.0 standard drinks of alcohol    Comment: Pt used to have alcohol problems.   Drug use: No   Sexual activity: Not on file  Other Topics Concern   Not on file  Social History Narrative   Not on file   Social Drivers of Health   Financial Resource Strain: Not on file  Food Insecurity: Not on file  Transportation Needs: Not on file  Physical Activity: Not on file  Stress: Not on file  Social Connections: Not on file  Intimate Partner Violence: Not on file     PHYSICAL EXAM  GENERAL EXAM/CONSTITUTIONAL: Vitals:  Vitals:   01/02/24 1118  BP: 128/70  Pulse: 80  Weight: 189 lb 12.8 oz (86.1 kg)  Height: 5' 6 (1.676 m)   Body mass index is 30.63 kg/m. Wt Readings from Last 3 Encounters:  01/02/24 189 lb 12.8 oz (86.1 kg)  12/19/23 194 lb 9.6 oz (88.3 kg)  09/12/23 193 lb 3.2 oz (87.6 kg)   Patient is in no distress; well developed, nourished and groomed; neck is supple  CARDIOVASCULAR: Examination of carotid arteries is normal; no carotid  bruits Regular rate and rhythm, no murmurs Examination of peripheral vascular system by observation and palpation is normal  EYES: Ophthalmoscopic exam of optic discs and posterior segments is normal; no papilledema or hemorrhages No results found.  MUSCULOSKELETAL: Gait, strength, tone, movements noted in Neurologic exam below  NEUROLOGIC: MENTAL STATUS:  No data to display         awake, alert, oriented to person, place and time recent and remote memory intact normal attention and concentration language fluent, comprehension intact, naming intact fund of knowledge appropriate  CRANIAL NERVE:  2nd - no papilledema on fundoscopic exam 2nd, 3rd, 4th, 6th - pupils equal and reactive to light, visual fields full to confrontation, extraocular muscles intact, no nystagmus 5th - facial sensation symmetric 7th - facial strength symmetric 8th - hearing intact 9th - palate elevates symmetrically, uvula midline 11th - shoulder shrug symmetric 12th - tongue protrusion midline MASKED FACIES HOARSE VOICE LEFT MOUTH TREMOR  MOTOR:  RESTING TREMOR IN LUE > RUE BRADYKINESIA AND COGWHEELING RIGIDITY (LEFT ARM AND LEFT LEG > RIGHT SIDE) normal bulk and tone, full strength in the BUE, BLE; EXCEPT LEFT SHOULDER WEAKNESS (2-3; LEFT ROTATOR CUFF INJURY)  SENSORY:  normal and symmetric to light touch; ABSENT IN KNEES AND FEET  COORDINATION:  finger-nose-finger, fine finger movements normal  REFLEXES:  deep tendon reflexes 1+ and symmetric  GAIT/STATION:  narrow based gait; UNSTEADY GAIT; STOOPED POSTURE; DECR ARM SWING     DIAGNOSTIC DATA (LABS, IMAGING, TESTING) - I reviewed patient records, labs, notes, testing and imaging myself where available.  Lab Results  Component Value Date   WBC 15.2 (H) 12/19/2023   HGB 13.7 12/19/2023   HCT 43.6 12/19/2023   MCV 94 12/19/2023   PLT 284 12/19/2023      Component Value Date/Time   NA 142 12/19/2023 1334   K 4.5  12/19/2023 1334   CL 102 12/19/2023 1334   CO2 22 12/19/2023 1334   GLUCOSE 92 12/19/2023 1334   GLUCOSE 133 (H) 12/14/2022 1325   BUN 25 12/19/2023 1334   CREATININE 1.87 (H) 12/19/2023 1334   CALCIUM  8.7 12/19/2023 1334   PROT 7.0 12/14/2022 1325   ALBUMIN 3.9 12/14/2022 1325   AST 14 (L) 12/14/2022 1325   ALT 9 12/14/2022 1325   ALKPHOS 54 12/14/2022 1325   BILITOT 1.6 (H) 12/14/2022 1325   GFRNONAA 36 (L) 12/14/2022 1325   GFRAA 49 (L) 04/18/2020 1438   No results found for: CHOL, HDL, LDLCALC, LDLDIRECT, TRIG, CHOLHDL No results found for: YHAJ8R No results found for: VITAMINB12 No results found for: TSH   02/19/08 MRI brain - No evidence of acute or reversible process.  Moderate chronic appearing small vessel changes affecting the hemispheric white matter.     ASSESSMENT AND PLAN  83 y.o. year old male here with:   Dx:  1. Parkinson's disease without dyskinesia or fluctuating manifestations (HCC)      PLAN:  PARKINSON'S DISEASE (resting tremor, bradykinesia, cogwheeling rigidity, postural instability; since ~2023) - check CT head (cannot tolerate MRI) - PT, OT, ST evaluations - start carbidopa  / levodopa  (25/100) half tab three times a day with meals x 1-2 weeks; then 1 tab three times a day with meals - fall precautions reviewed, especially with walker and shower / bathroom safety  Orders Placed This Encounter  Procedures   CT HEAD WO CONTRAST ( )   Ambulatory referral to Physical Therapy   Ambulatory referral to Occupational Therapy   Ambulatory referral to Speech Therapy   Meds ordered this encounter  Medications   carbidopa -levodopa  (SINEMET  IR) 25-100 MG tablet    Sig: Take 1 tablet by mouth 3 (three) times daily before meals.    Dispense:  90 tablet    Refill:  6   Return in about 6 months (around  07/04/2024).    EDUARD FABIENE HANLON, MD 01/02/2024, 11:55 AM Certified in Neurology, Neurophysiology and  Neuroimaging  [facility] [address], [address] [place], [place] [address] [phone]

## 2024-01-02 NOTE — Patient Instructions (Signed)
  PARKINSON'S DISEASE (resting tremor, bradykinesia, cogwheeling rigidity, postural instability; since ~2023) - check CT head (cannot tolerate MRI) - PT, OT, ST evaluations - start carbidopa  / levodopa  (25/100) half tab three times a day with meals x 1-2 weeks; then 1 tab three times a day with meals - fall precautions reviewed, especially with walker and shower / bathroom safety

## 2024-01-06 ENCOUNTER — Ambulatory Visit
Admission: RE | Admit: 2024-01-06 | Discharge: 2024-01-06 | Disposition: A | Discharge status: Home / Self Care | Source: Ambulatory Visit | Attending: Diagnostic Neuroimaging | Admitting: Diagnostic Neuroimaging

## 2024-01-06 DIAGNOSIS — G20A1 Parkinson's disease without dyskinesia, without mention of fluctuations: Secondary | ICD-10-CM

## 2024-01-08 ENCOUNTER — Ambulatory Visit: Payer: Self-pay | Admitting: Neurology

## 2024-01-08 DIAGNOSIS — G20A1 Parkinson's disease without dyskinesia, without mention of fluctuations: Secondary | ICD-10-CM

## 2024-01-08 NOTE — Telephone Encounter (Signed)
-----   Message from True Mar sent at 01/08/2024  8:45 AM EDT ----- CT head without contrast shows chronic changes in keeping with moderate degree of hardening of the arteries and mild volume loss, which we call atrophy, which also happens over time and may be  age-appropriate. No acute findings.  Pls notify patient.  ----- Message ----- From: Delfino Augustin BROCKS, RN Sent: 01/08/2024   8:05 AM EDT To: True Mar, MD  Please result in Dr Chancy absence ----- Message ----- From: Vear Charlie LABOR, MD Sent: 01/06/2024   8:28 PM EDT To: Eduard JONELLE Hanlon, MD

## 2024-01-08 NOTE — Telephone Encounter (Signed)
 Pt's daughter has called Augustin, Charity fundraiser back

## 2024-01-08 NOTE — Telephone Encounter (Signed)
 Called the patient's daughter (primary number listed on DPR) there was no answer LVM for the daughter to call back.

## 2024-01-08 NOTE — Telephone Encounter (Signed)
 Pt DIL Trisha cld back (on [place]) for results. Discussed results of CT. Sueanne voiced understanding. Sueanne requesting for the PT referral be changed for Pt to have PT at home vs outpatient. Informed Sueanne, we will place a new order but it make take a few days for [facility] provider to reach out. She voiced understanding and thanks for the help.

## 2024-01-08 NOTE — Addendum Note (Signed)
 Addended by: Frederik Standley K on: 01/08/2024 12:31 PM   Modules accepted: Orders

## 2024-01-09 ENCOUNTER — Telehealth: Payer: Self-pay | Admitting: Diagnostic Neuroimaging

## 2024-01-09 NOTE — Telephone Encounter (Signed)
 [facility] is going to take this patient.

## 2024-01-16 ENCOUNTER — Telehealth: Payer: Self-pay | Admitting: Cardiology

## 2024-01-16 NOTE — Telephone Encounter (Signed)
 Pt's daughter is calling to cancel cardioversion on 9/2. Please advise.

## 2024-01-16 NOTE — Telephone Encounter (Signed)
 Left message for pt to call.

## 2024-01-17 ENCOUNTER — Other Ambulatory Visit: Payer: Self-pay | Admitting: Cardiology

## 2024-01-17 DIAGNOSIS — I1A0 Resistant hypertension: Secondary | ICD-10-CM

## 2024-01-17 DIAGNOSIS — I5032 Chronic diastolic (congestive) heart failure: Secondary | ICD-10-CM

## 2024-01-17 MED ORDER — BUMETANIDE 0.5 MG PO TABS
0.5000 mg | ORAL_TABLET | ORAL | 3 refills | Status: DC
Start: 2024-01-17 — End: 2024-01-24

## 2024-01-17 NOTE — Progress Notes (Signed)
 Reviewing charts for Tuesday procedure.  Note from yesterday that daughter called to cancel procedure.  Spoke with Avelina, the daughter, she states they want to cancel procedure, they are going to get a second opinion.  Will cancel procedure and notify Dr Ladona.

## 2024-01-20 ENCOUNTER — Emergency Department (HOSPITAL_COMMUNITY)

## 2024-01-20 ENCOUNTER — Inpatient Hospital Stay (HOSPITAL_COMMUNITY)
Admission: EM | Admit: 2024-01-20 | Discharge: 2024-01-24 | DRG: 291 | Disposition: A | Discharge status: Home / Self Care | Attending: Internal Medicine | Admitting: Internal Medicine

## 2024-01-20 ENCOUNTER — Other Ambulatory Visit: Payer: Self-pay

## 2024-01-20 ENCOUNTER — Encounter (HOSPITAL_COMMUNITY): Payer: Self-pay

## 2024-01-20 DIAGNOSIS — I10 Essential (primary) hypertension: Secondary | ICD-10-CM | POA: Diagnosis not present

## 2024-01-20 DIAGNOSIS — M109 Gout, unspecified: Secondary | ICD-10-CM | POA: Diagnosis not present

## 2024-01-20 DIAGNOSIS — I110 Hypertensive heart disease with heart failure: Secondary | ICD-10-CM | POA: Diagnosis not present

## 2024-01-20 DIAGNOSIS — I5033 Acute on chronic diastolic (congestive) heart failure: Secondary | ICD-10-CM | POA: Diagnosis present

## 2024-01-20 DIAGNOSIS — I5020 Unspecified systolic (congestive) heart failure: Secondary | ICD-10-CM | POA: Diagnosis present

## 2024-01-20 DIAGNOSIS — N1832 Chronic kidney disease, stage 3b: Secondary | ICD-10-CM | POA: Diagnosis present

## 2024-01-20 DIAGNOSIS — I3139 Other pericardial effusion (noninflammatory): Secondary | ICD-10-CM | POA: Diagnosis present

## 2024-01-20 DIAGNOSIS — N183 Chronic kidney disease, stage 3 unspecified: Secondary | ICD-10-CM | POA: Diagnosis not present

## 2024-01-20 DIAGNOSIS — Z823 Family history of stroke: Secondary | ICD-10-CM | POA: Diagnosis not present

## 2024-01-20 DIAGNOSIS — Z888 Allergy status to other drugs, medicaments and biological substances status: Secondary | ICD-10-CM | POA: Diagnosis not present

## 2024-01-20 DIAGNOSIS — N1831 Chronic kidney disease, stage 3a: Secondary | ICD-10-CM

## 2024-01-20 DIAGNOSIS — E669 Obesity, unspecified: Secondary | ICD-10-CM | POA: Diagnosis not present

## 2024-01-20 DIAGNOSIS — Z825 Family history of asthma and other chronic lower respiratory diseases: Secondary | ICD-10-CM

## 2024-01-20 DIAGNOSIS — I340 Nonrheumatic mitral (valve) insufficiency: Secondary | ICD-10-CM | POA: Diagnosis present

## 2024-01-20 DIAGNOSIS — I480 Paroxysmal atrial fibrillation: Secondary | ICD-10-CM | POA: Diagnosis present

## 2024-01-20 DIAGNOSIS — R109 Unspecified abdominal pain: Secondary | ICD-10-CM | POA: Diagnosis not present

## 2024-01-20 DIAGNOSIS — M19012 Primary osteoarthritis, left shoulder: Secondary | ICD-10-CM | POA: Diagnosis not present

## 2024-01-20 DIAGNOSIS — R1084 Generalized abdominal pain: Secondary | ICD-10-CM | POA: Diagnosis not present

## 2024-01-20 DIAGNOSIS — Z8673 Personal history of transient ischemic attack (TIA), and cerebral infarction without residual deficits: Secondary | ICD-10-CM

## 2024-01-20 DIAGNOSIS — I5032 Chronic diastolic (congestive) heart failure: Secondary | ICD-10-CM | POA: Diagnosis not present

## 2024-01-20 DIAGNOSIS — N281 Cyst of kidney, acquired: Secondary | ICD-10-CM | POA: Diagnosis not present

## 2024-01-20 DIAGNOSIS — I4819 Other persistent atrial fibrillation: Secondary | ICD-10-CM | POA: Insufficient documentation

## 2024-01-20 DIAGNOSIS — Z880 Allergy status to penicillin: Secondary | ICD-10-CM | POA: Diagnosis not present

## 2024-01-20 DIAGNOSIS — Z8249 Family history of ischemic heart disease and other diseases of the circulatory system: Secondary | ICD-10-CM

## 2024-01-20 DIAGNOSIS — E7800 Pure hypercholesterolemia, unspecified: Secondary | ICD-10-CM | POA: Diagnosis not present

## 2024-01-20 DIAGNOSIS — Z881 Allergy status to other antibiotic agents status: Secondary | ICD-10-CM

## 2024-01-20 DIAGNOSIS — N179 Acute kidney failure, unspecified: Secondary | ICD-10-CM | POA: Diagnosis present

## 2024-01-20 DIAGNOSIS — E66811 Obesity, class 1: Secondary | ICD-10-CM | POA: Diagnosis present

## 2024-01-20 DIAGNOSIS — K219 Gastro-esophageal reflux disease without esophagitis: Secondary | ICD-10-CM | POA: Diagnosis present

## 2024-01-20 DIAGNOSIS — G20C Parkinsonism, unspecified: Secondary | ICD-10-CM

## 2024-01-20 DIAGNOSIS — G20A1 Parkinson's disease without dyskinesia, without mention of fluctuations: Secondary | ICD-10-CM | POA: Diagnosis present

## 2024-01-20 DIAGNOSIS — N400 Enlarged prostate without lower urinary tract symptoms: Secondary | ICD-10-CM | POA: Diagnosis not present

## 2024-01-20 DIAGNOSIS — E876 Hypokalemia: Secondary | ICD-10-CM | POA: Diagnosis present

## 2024-01-20 DIAGNOSIS — I4891 Unspecified atrial fibrillation: Secondary | ICD-10-CM | POA: Diagnosis not present

## 2024-01-20 DIAGNOSIS — I493 Ventricular premature depolarization: Secondary | ICD-10-CM | POA: Diagnosis present

## 2024-01-20 DIAGNOSIS — Z86718 Personal history of other venous thrombosis and embolism: Secondary | ICD-10-CM | POA: Diagnosis not present

## 2024-01-20 DIAGNOSIS — I2510 Atherosclerotic heart disease of native coronary artery without angina pectoris: Secondary | ICD-10-CM | POA: Diagnosis present

## 2024-01-20 DIAGNOSIS — Z885 Allergy status to narcotic agent status: Secondary | ICD-10-CM

## 2024-01-20 DIAGNOSIS — I509 Heart failure, unspecified: Secondary | ICD-10-CM | POA: Diagnosis not present

## 2024-01-20 DIAGNOSIS — Z7901 Long term (current) use of anticoagulants: Secondary | ICD-10-CM | POA: Diagnosis not present

## 2024-01-20 DIAGNOSIS — J811 Chronic pulmonary edema: Secondary | ICD-10-CM | POA: Diagnosis not present

## 2024-01-20 DIAGNOSIS — I499 Cardiac arrhythmia, unspecified: Secondary | ICD-10-CM | POA: Diagnosis not present

## 2024-01-20 DIAGNOSIS — Z96653 Presence of artificial knee joint, bilateral: Secondary | ICD-10-CM | POA: Diagnosis not present

## 2024-01-20 DIAGNOSIS — Z91148 Patient's other noncompliance with medication regimen for other reason: Secondary | ICD-10-CM

## 2024-01-20 DIAGNOSIS — S43002A Unspecified subluxation of left shoulder joint, initial encounter: Secondary | ICD-10-CM | POA: Diagnosis not present

## 2024-01-20 DIAGNOSIS — I1A0 Resistant hypertension: Secondary | ICD-10-CM

## 2024-01-20 DIAGNOSIS — I5023 Acute on chronic systolic (congestive) heart failure: Secondary | ICD-10-CM | POA: Diagnosis present

## 2024-01-20 DIAGNOSIS — K429 Umbilical hernia without obstruction or gangrene: Secondary | ICD-10-CM | POA: Diagnosis not present

## 2024-01-20 DIAGNOSIS — Z79899 Other long term (current) drug therapy: Secondary | ICD-10-CM

## 2024-01-20 DIAGNOSIS — J9811 Atelectasis: Secondary | ICD-10-CM | POA: Diagnosis not present

## 2024-01-20 DIAGNOSIS — I491 Atrial premature depolarization: Secondary | ICD-10-CM | POA: Diagnosis not present

## 2024-01-20 DIAGNOSIS — I130 Hypertensive heart and chronic kidney disease with heart failure and stage 1 through stage 4 chronic kidney disease, or unspecified chronic kidney disease: Principal | ICD-10-CM | POA: Diagnosis present

## 2024-01-20 DIAGNOSIS — J90 Pleural effusion, not elsewhere classified: Secondary | ICD-10-CM | POA: Diagnosis not present

## 2024-01-20 DIAGNOSIS — I5031 Acute diastolic (congestive) heart failure: Secondary | ICD-10-CM | POA: Diagnosis not present

## 2024-01-20 DIAGNOSIS — I361 Nonrheumatic tricuspid (valve) insufficiency: Secondary | ICD-10-CM | POA: Diagnosis not present

## 2024-01-20 DIAGNOSIS — R0602 Shortness of breath: Secondary | ICD-10-CM | POA: Diagnosis not present

## 2024-01-20 HISTORY — DX: Parkinson's disease without dyskinesia, without mention of fluctuations: G20.A1

## 2024-01-20 LAB — BASIC METABOLIC PANEL WITH GFR
Anion gap: 9 (ref 5–15)
BUN: 24 mg/dL — ABNORMAL HIGH (ref 8–23)
CO2: 25 mmol/L (ref 22–32)
Calcium: 9.3 mg/dL (ref 8.9–10.3)
Chloride: 106 mmol/L (ref 98–111)
Creatinine, Ser: 1.67 mg/dL — ABNORMAL HIGH (ref 0.61–1.24)
GFR, Estimated: 40 mL/min — ABNORMAL LOW (ref >60)
Glucose, Bld: 110 mg/dL — ABNORMAL HIGH (ref 70–99)
Potassium: 4 mmol/L (ref 3.5–5.1)
Sodium: 140 mmol/L (ref 135–145)

## 2024-01-20 LAB — CBC WITH DIFFERENTIAL/PLATELET
Abs Immature Granulocytes: 0.05 K/uL (ref 0.00–0.07)
Basophils Absolute: 0.1 K/uL (ref 0.0–0.1)
Basophils Relative: 1 %
Eosinophils Absolute: 0.2 K/uL (ref 0.0–0.5)
Eosinophils Relative: 2 %
HCT: 42 % (ref 39.0–52.0)
Hemoglobin: 13.3 g/dL (ref 13.0–17.0)
Immature Granulocytes: 1 %
Lymphocytes Relative: 12 %
Lymphs Abs: 1.2 K/uL (ref 0.7–4.0)
MCH: 29.5 pg (ref 26.0–34.0)
MCHC: 31.7 g/dL (ref 30.0–36.0)
MCV: 93.1 fL (ref 80.0–100.0)
Monocytes Absolute: 0.8 K/uL (ref 0.1–1.0)
Monocytes Relative: 7 %
Neutro Abs: 8.2 K/uL — ABNORMAL HIGH (ref 1.7–7.7)
Neutrophils Relative %: 77 %
Platelets: 159 K/uL (ref 150–400)
RBC: 4.51 MIL/uL (ref 4.22–5.81)
RDW: 15.7 % — ABNORMAL HIGH (ref 11.5–15.5)
WBC: 10.5 K/uL (ref 4.0–10.5)
nRBC: 0 % (ref 0.0–0.2)

## 2024-01-20 LAB — URINALYSIS, ROUTINE W REFLEX MICROSCOPIC
Bacteria, UA: NONE SEEN
Bilirubin Urine: NEGATIVE
Glucose, UA: NEGATIVE mg/dL
Hgb urine dipstick: NEGATIVE
Ketones, ur: NEGATIVE mg/dL
Leukocytes,Ua: NEGATIVE
Nitrite: NEGATIVE
Protein, ur: NEGATIVE mg/dL
Specific Gravity, Urine: 1.015 (ref 1.005–1.030)
pH: 5 (ref 5.0–8.0)

## 2024-01-20 LAB — CBG MONITORING, ED: Glucose-Capillary: 109 mg/dL — ABNORMAL HIGH (ref 70–99)

## 2024-01-20 LAB — HEPATIC FUNCTION PANEL
ALT: 7 U/L (ref 0–44)
AST: 20 U/L (ref 15–41)
Albumin: 3.5 g/dL (ref 3.5–5.0)
Alkaline Phosphatase: 51 U/L (ref 38–126)
Bilirubin, Direct: 0.3 mg/dL — ABNORMAL HIGH (ref 0.0–0.2)
Indirect Bilirubin: 1.1 mg/dL — ABNORMAL HIGH (ref 0.3–0.9)
Total Bilirubin: 1.4 mg/dL — ABNORMAL HIGH (ref 0.0–1.2)
Total Protein: 6.4 g/dL — ABNORMAL LOW (ref 6.5–8.1)

## 2024-01-20 LAB — MAGNESIUM: Magnesium: 1.9 mg/dL (ref 1.7–2.4)

## 2024-01-20 LAB — LIPASE, BLOOD: Lipase: 28 U/L (ref 11–51)

## 2024-01-20 LAB — TROPONIN I (HIGH SENSITIVITY)
Troponin I (High Sensitivity): 18 ng/L — ABNORMAL HIGH (ref <18)
Troponin I (High Sensitivity): 19 ng/L — ABNORMAL HIGH (ref <18)

## 2024-01-20 LAB — BRAIN NATRIURETIC PEPTIDE: B Natriuretic Peptide: 2227.1 pg/mL — ABNORMAL HIGH (ref 0.0–100.0)

## 2024-01-20 MED ORDER — CLONIDINE HCL 0.1 MG/24HR TD PTWK
0.1000 mg | MEDICATED_PATCH | TRANSDERMAL | Status: DC
  Administered 2024-01-21: 0.1 mg via TRANSDERMAL
  Filled 2024-01-20: qty 1

## 2024-01-20 MED ORDER — APIXABAN 2.5 MG PO TABS
2.5000 mg | ORAL_TABLET | Freq: Two times a day (BID) | ORAL | Status: DC
  Administered 2024-01-20 – 2024-01-24 (×8): 2.5 mg via ORAL
  Filled 2024-01-20 (×8): qty 1

## 2024-01-20 MED ORDER — SODIUM CHLORIDE 0.9% FLUSH
3.0000 mL | Freq: Two times a day (BID) | INTRAVENOUS | Status: DC
  Administered 2024-01-20 – 2024-01-24 (×8): 3 mL via INTRAVENOUS

## 2024-01-20 MED ORDER — ACETAMINOPHEN 650 MG RE SUPP: 650.0000 mg | Freq: Four times a day (QID) | RECTAL | Status: DC | PRN

## 2024-01-20 MED ORDER — ATORVASTATIN CALCIUM 10 MG PO TABS
10.0000 mg | ORAL_TABLET | Freq: Every day | ORAL | Status: DC
  Administered 2024-01-21 – 2024-01-24 (×4): 10 mg via ORAL
  Filled 2024-01-20 (×4): qty 1

## 2024-01-20 MED ORDER — ACETAMINOPHEN 325 MG PO TABS
650.0000 mg | ORAL_TABLET | Freq: Four times a day (QID) | ORAL | Status: DC | PRN
  Administered 2024-01-20 – 2024-01-24 (×5): 650 mg via ORAL
  Filled 2024-01-20 (×5): qty 2

## 2024-01-20 MED ORDER — PANTOPRAZOLE SODIUM 40 MG PO TBEC
40.0000 mg | DELAYED_RELEASE_TABLET | Freq: Every day | ORAL | Status: DC
  Administered 2024-01-21 – 2024-01-24 (×4): 40 mg via ORAL
  Filled 2024-01-20 (×4): qty 1

## 2024-01-20 MED ORDER — FUROSEMIDE 10 MG/ML IJ SOLN
40.0000 mg | Freq: Once | INTRAMUSCULAR | Status: AC
  Administered 2024-01-20: 40 mg via INTRAVENOUS
  Filled 2024-01-20: qty 4

## 2024-01-20 MED ORDER — FINASTERIDE 5 MG PO TABS
5.0000 mg | ORAL_TABLET | Freq: Every day | ORAL | Status: DC
  Administered 2024-01-21 – 2024-01-24 (×4): 5 mg via ORAL
  Filled 2024-01-20 (×4): qty 1

## 2024-01-20 MED ORDER — POLYETHYLENE GLYCOL 3350 17 G PO PACK
17.0000 g | PACK | Freq: Every day | ORAL | Status: DC | PRN
Start: 2024-01-20 — End: 2024-01-24

## 2024-01-20 MED ORDER — TAMSULOSIN HCL 0.4 MG PO CAPS
0.4000 mg | ORAL_CAPSULE | Freq: Every day | ORAL | Status: DC
  Administered 2024-01-21 – 2024-01-24 (×4): 0.4 mg via ORAL
  Filled 2024-01-20 (×4): qty 1

## 2024-01-20 MED ORDER — NEBIVOLOL HCL 10 MG PO TABS
20.0000 mg | ORAL_TABLET | Freq: Every day | ORAL | Status: DC
  Administered 2024-01-21 – 2024-01-24 (×4): 20 mg via ORAL
  Filled 2024-01-20 (×5): qty 2

## 2024-01-20 MED ORDER — CARBIDOPA-LEVODOPA 25-100 MG PO TABS
1.0000 | ORAL_TABLET | Freq: Three times a day (TID) | ORAL | Status: DC
  Administered 2024-01-20 – 2024-01-24 (×12): 1 via ORAL
  Filled 2024-01-20 (×12): qty 1

## 2024-01-20 MED ORDER — FUROSEMIDE 10 MG/ML IJ SOLN
40.0000 mg | Freq: Two times a day (BID) | INTRAMUSCULAR | Status: DC
  Administered 2024-01-21 – 2024-01-22 (×3): 40 mg via INTRAVENOUS
  Filled 2024-01-20 (×3): qty 4

## 2024-01-20 NOTE — ED Notes (Signed)
Pt and all belongings transported upstairs.  

## 2024-01-20 NOTE — ED Notes (Signed)
 Patient transported to CT

## 2024-01-20 NOTE — Consult Note (Addendum)
 Cardiology Consultation   Patient ID: Keith [name] MRN: [id number]; DOB: 15-Jul-1940  Admit date: 01/20/2024 Date of Consult: 01/20/2024  PCP:  Gerome Brunet, DO   [facility] Providers Cardiologist:  Gordy Bergamo, MD    Patient Profile: Keith [name] is a 83 y.o. male with a hx of hypertension, chronic diastolic heart failure, stage III CKD, hyperlipidemia, DVT, atrial fibrillation who is being seen 01/20/2024 for the evaluation of atrial fibrillation at the request of Dr. Melvenia.  History of Present Illness: Keith [name] is a 83 year old male with past medical history noted above.  He has been followed by Dr. Bergamo as an outpatient.  He underwent nuclear stress test 08/2018 which was low to intermediate risk with an LVEF of 43%.   Echocardiogram 06/2021 with LVEF of 50 to 55% without significant valvular abnormalities which was an improvement from prior echocardiogram 01/2020 of 35 to 40%.   He was diagnosed with a DVT in July 2024 in the right lower extremity extending from the right common femoral vein with associated leg edema which resolved after starting on anticoagulation.   He was seen in the office 12/19/2023 and found to be in new onset atrial fibrillation confirmed by EKG. it was recommended that he resume on Eliquis  5 mg twice daily with outpatient cardioversion to be done after 3 weeks of anticoagulation.  Appears from office notes that patient's daughter called back on 8/29 and requested that procedure be canceled as they wanted a second opinion.  Presented to the ED on 9/1 with generalized weakness, shortness of breath and fluid retention.  He currently lives at home with his wife but grandson has been there intermittently the past 3 weeks trying to help out with caring for both of them as well as helping out with her medications.  Patient and family both report that he is sporadic with actually taking his medications and frequently misses doses, including his Eliquis .  He  does think that he has been mostly compliant with his diuretic prior to admission but noticed increasing lower extremity edema, abdominal fullness and orthopnea.   In the ED labs showed sodium 140, potassium 4, creatinine 1.67, magnesium 1.9, BNP 2227, high-sensitivity troponin 18>> 19, WBC 10.5, hemoglobin 13.3.  EKG shows atrial fibrillation, 108 bpm, freq PVCs.  Chest x-ray with small bilateral effusions and bibasilar atelectasis.  CT chest abdomen pelvis with small to moderate bilateral pleural effusions.  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   TIA (transient ischemic attack)     Past Surgical History:  Procedure Laterality Date   bilateral tkr     EXTRACORPOREAL SHOCK WAVE LITHOTRIPSY Right 03/02/2019   Procedure: EXTRACORPOREAL SHOCK WAVE LITHOTRIPSY (ESWL);  Surgeon: Carolee Sherwood JONETTA DOUGLAS, MD;  Location: [facility];  Service: Urology;  Laterality: Right;   HERNIA REPAIR     reset nasal fracture  TONSILLECTOMY      Scheduled Meds:  Continuous Infusions:  PRN Meds:   Allergies:    Allergies  Allergen Reactions   Amlodipine Swelling   Labetalol Swelling    Swollen lips    Lisinopril Cough   Penicillins Rash   Allopurinol     Other reaction(s): rash   Amoxicillin-Pot Clavulanate     Other reaction(s): swelling of the neck ad throat   Azithromycin     Other reaction(s): Unknown   Benzonatate      Other reaction(s): Unknown   Cefuroxime     Other reaction(s): facial swelling, rash   Codeine     Other reaction(s): itching, rash   Epinephrine     Other reaction(s): tachycardia   Procaine Hcl     REACTION: increased bp   Valsartan     Other reaction(s): cough   Prednisone Rash    Social History:   Social History   Socioeconomic History   Marital status: Married    Spouse name: Not on file   Number of children: 1   Years of education: Not on file   Highest education level: Not on file  Occupational History   Occupation: retired   Occupation: works part Manufacturing engineer  Tobacco Use   Smoking status: Never   Smokeless tobacco: Never  Vaping Use   Vaping status: Never Used  Substance and Sexual Activity   Alcohol use: No    Alcohol/week: 0.0 standard drinks of alcohol    Comment: Pt used to have alcohol problems.   Drug use: No   Sexual activity: Not on file  Other Topics Concern   Not on file  Social History Narrative   Not on file   Social Drivers of Health   Financial Resource Strain: Not on file  Food Insecurity: Not on file  Transportation Needs: Not on file  Physical Activity: Not on file  Stress: Not on file  Social Connections: Not on file  Intimate Partner Violence: Not on file    Family History:    Family History  Problem Relation Age of Onset   Allergy (severe) Mother    Heart attack Father    Stroke Father    COPD Brother    Heart attack Brother    Tuberculosis Other        grandmother     ROS:  Please see the history of present illness.   All other ROS reviewed and negative.     Physical Exam/Data: Vitals:   01/20/24 1245 01/20/24 1315 01/20/24 1400 01/20/24 1445  BP: (!) 142/92 104/63 (!) 156/88 (!) 164/85  Pulse: 60 (!) 101 64 88  Resp: 20 (!) 22 (!) 23 18  Temp:      TempSrc:      SpO2: 93% 93% 97% 92%  Weight:      Height:        Intake/Output Summary (Last 24 hours) at 01/20/2024 1524 Last data filed at 01/20/2024  1347 Gross per 24 hour  Intake --  Output 400 ml  Net -400 ml      01/20/2024   11:39 AM 01/02/2024   11:18 AM 12/19/2023   11:49 AM  Last 3 Weights  Weight (lbs) 197 lb 189 lb 12.8 oz 194 lb 9.6 oz  Weight (kg) 89.359 kg 86.093 kg 88.27 kg     Body mass index is 31.8 kg/m.  General:  Well nourished, well developed, in no acute distress HEENT: normal Neck: + JVD Vascular: No carotid  bruits; Distal pulses 2+ bilaterally Cardiac:  normal S1, S2; Irreg Irreg; soft systolic murmur RUSB Lungs: Diminished with crackles bilaterally Abd: soft, nontender, no hepatomegaly  Ext: 2+ pitting LE edema Musculoskeletal:  No deformities, BUE and BLE strength normal and equal Skin: warm and dry  Neuro:  no focal abnormalities noted Psych:  Normal affect   EKG:  The EKG was personally reviewed and demonstrates:  shows atrial fibrillation, 108 bpm, freq PVCs  Telemetry:  Telemetry was personally reviewed and demonstrates:  Atrial fibrillation, rates 90-110s, PVCs  Relevant CV Studies:  Echo: 2023  Echocardiogram 06/29/2021:  Left ventricle cavity is normal in size. Mild concentric hypertrophy of  the left ventricle. Normal global wall motion. Normal LV systolic function  with LVEF 50-55%. Doppler evidence of grade I (impaired) diastolic  dysfunction, normal LAP.  Left atrial cavity is mildly dilated.  Trileaflet aortic valve with Mild aortic valve leaflet calcification.  Trace aortic valve stenosis. No regurgitation.  Mild (Grade I) mitral regurgitation.  Mild tricuspid regurgitation.  Mild pulmonic regurgitation.  No evidence of pulmonary hypertension.   Laboratory Data: High Sensitivity Troponin:   Recent Labs  Lab 01/20/24 1149 01/20/24 1412  TROPONINIHS 18* 19*     Chemistry Recent Labs  Lab 01/20/24 1149  NA 140  K 4.0  CL 106  CO2 25  GLUCOSE 110*  BUN 24*  CREATININE 1.67*  CALCIUM  9.3  MG 1.9  GFRNONAA 40*  ANIONGAP 9    Recent Labs  Lab 01/20/24 1149  PROT  6.4*  ALBUMIN 3.5  AST 20  ALT 7  ALKPHOS 51  BILITOT 1.4*   Lipids No results for input(s): CHOL, TRIG, HDL, LABVLDL, LDLCALC, CHOLHDL in the last 168 hours.  Hematology Recent Labs  Lab 01/20/24 1149  WBC 10.5  RBC 4.51  HGB 13.3  HCT 42.0  MCV 93.1  MCH 29.5  MCHC 31.7  RDW 15.7*  PLT 159   Thyroid  No results for input(s): TSH, FREET4 in the last 168 hours.  BNP Recent Labs  Lab 01/20/24 1149  BNP 2,227.1*    DDimer No results for input(s): DDIMER in the last 168 hours.  Radiology/Studies:  CT CHEST ABDOMEN PELVIS WO CONTRAST Result Date: 01/20/2024 CLINICAL DATA:  The unintended weight loss EXAM: CT CHEST, ABDOMEN AND PELVIS WITHOUT CONTRAST TECHNIQUE: Multidetector CT imaging of the chest, abdomen and pelvis was performed following the standard protocol without IV contrast. RADIATION DOSE REDUCTION: This exam was performed according to the departmental dose-optimization program which includes automated exposure control, adjustment of the mA and/or kV according to patient size and/or use of iterative reconstruction technique. COMPARISON:  Same day chest radiograph and prior studies FINDINGS: CT CHEST FINDINGS Cardiovascular: Normal caliber aorta. Scattered aortic calcifications. No pericardial effusion. Mediastinum/Nodes: No lymphadenopathy. Lungs/Pleura: Small to moderate bilateral pleural effusions with associated bibasilar atelectasis/consolidation. Interlobular septal thickening, suggestive of mild pulmonary edema. Tiny 0.5 cm right middle lobe pulmonary nodule, unchanged from prior. Musculoskeletal: No acute osseous findings. CT ABDOMEN PELVIS FINDINGS Hepatobiliary: Unremarkable. Pancreas: Unremarkable. Spleen: Unremarkable. Adrenals/Urinary Tract: Adrenal glands are unremarkable. Symmetric nephrograms. No hydronephrosis or nephrolithiasis. 8.2 x 5.4 cm left renal cyst. Stomach/Bowel: No evidence of bowel obstruction or inflammation. The visualized appendix  is unremarkable. Vascular/Lymphatic: Aortic atherosclerosis. No enlarged abdominal or pelvic lymph nodes. Reproductive: Enlarged prostate, measuring approximately 5.7 by 5.4 cm. Scattered prostatic calcifications, similar to prior. Other: No free air free fluid. Fat containing bilateral inguinal and umbilical hernias. Musculoskeletal: No acute osseous findings. IMPRESSION: 1. Small  to moderate bilateral pleural effusions with likely mild interstitial pulmonary edema. 2. Stable additional chronic and incidental findings as noted above. Electronically Signed   By: Michaeline Blanch M.D.   On: 01/20/2024 14:41   DG Shoulder Left Result Date: 01/20/2024 EXAM: 1 VIEW XRAY OF THE LEFT SHOULDER 01/20/2024 01:23:00 PM COMPARISON: None available. CLINICAL HISTORY: L shoulder pain. FINDINGS: BONES AND JOINTS: No fracture. Moderate osteoarthritis of the acromioclavicular joint. Superior subluxation of humeral head and remodeling of the undersurface of the acromion . SOFT TISSUES: No abnormal calcifications. Visualized lung is unremarkable. IMPRESSION: 1. No fracture. 2. Superior subluxation of the humeral head and remodeling of the undersurface of the acromion, likely reflecting chronic rotator cuff pathology. Electronically signed by: Selinda Blue MD 01/20/2024 02:14 PM EDT RP Workstation: [id number]   DG Chest Portable 1 View Result Date: 01/20/2024 EXAM: 1 VIEW XRAY OF THE CHEST 01/20/2024 12:01:00 PM COMPARISON: 05/25/2023 CLINICAL HISTORY: SOB, atrial fibrillation FINDINGS: LUNGS AND PLEURA: Small bilateral pleural effusions. Mild pulmonary edema. Bibasilar atelectasis. HEART AND MEDIASTINUM: Borderline mild cardiomegaly. BONES AND SOFT TISSUES: No acute osseous abnormality. IMPRESSION: 1. Mild CHF with small bilateral pleural effusions and bibasilar atelectasis. Electronically signed by: Selinda Blue MD 01/20/2024 12:05 PM EDT RP Workstation: [id number]     Assessment and Plan:  TREVYN LUMPKIN is a 83 y.o. male with a  hx of hypertension, chronic diastolic heart failure, stage III CKD, hyperlipidemia, DVT, atrial fibrillation who is being seen 01/20/2024 for the evaluation of atrial fibrillation at the request of Dr. Melvenia.  Persistent atrial fibrillation PVCs -- Initially diagnosed in the office 7/31 and placed on Eliquis  2.5 mg twice daily per Dr. Ladona.  Was set up for outpatient cardioversion but family canceled. -- Continues to be in atrial fibrillation RVR at times, with frequent PVCs.  Suspect this is contributing to his acute on chronic heart failure. -- Long discussion with patient and family where he does report being noncompliant with his Eliquis  dosing missing doses here and there.  Would plan for TEE/DCCV as restoration of sinus rhythm would improve his heart failure symptoms.  He would need to commit to compliance after having had his cardioversion completed.  Would plan TEE/DCCV prior to discharge once more euvolemic -- On Nebivolol  20 mg daily, Eliquis  2.5 mg twice daily  Acute on Chronic diastolic HF -- Echo 2023 with LVEF of 50 to 55%, improved from previous 35% in 2021 -- Presents with significant volume overload, BNP 2227, moderate bilateral effusions on CT -- Given IV Lasix  in the ED with reported urine output in improvement in his breathing thus far -- Continue IV Lasix  40 mg twice daily -- Update echocardiogram -- SGLT2i may be a good option if cost feasible  CKD stage III -- Cr baseline 1.6-1.8, stable at 1.67 on admission  Hypertension -- Elevated in the ED, has not received his daily medications -- On Nebivolol  20 mg daily, clonidine  patch 0.1 mg.  May need to further adjust guideline therapy pending echocardiogram  Hyperlipidemia -- Continue atorvastatin    Risk Assessment/Risk Scores:  [place]  Heart Association (NYHA) Functional Class NYHA Class III  CHA2DS2-VASc Score = 5  This indicates a 7.2% annual risk of stroke. The patient's score is based upon: CHF History:  1 HTN History: 1 Diabetes History: 0 Stroke History: 0 Vascular Disease History: 1 Age Score: 2 Gender Score: 0   For questions or updates, please contact [facility] Please consult [email or web address] for contact info under    Signed,  Manuelita Rummer, NP  01/20/2024 3:24 PM  Patient seen and examined.  Agree with above documentation.  Mr. Anastasia is an 83 year old male with a history of atrial fibrillation, chronic diastolic heart failure, CKD stage III, hyperlipidemia we are consulted by Dr. Melvenia for evaluation of heart failure and atrial fibrillation.  He follows with Dr. Ladona.  He was seen in clinic on 12/19/2023, found to be in new onset atrial fibrillation.  Started on Eliquis  and recommended outpatient cardioversion in 3 weeks.  Patient's daughter called the clinic on 8/29 and canceled the procedure, stated that they wanted a second opinion.  Presented to the ED today with worsening shortness of breath and swelling in legs.  Reports has felt more short of breath over the last few days.  In the ED, initial vital signs notable for BP 140/99, pulse 109, SpO2 93% on room air.  Labs notable for creatinine 1.67, BNP 2227, troponin 18 > 19, hemoglobin 13.3, platelets 159, WBC 10.5.  Chest x-ray showed mild CHF with small bilateral pleural effusions.  EKG shows atrial fibrillation, rate 108, PVCs, poor R wave progression.  On exam, patient is alert and oriented, irregular rhythm, tachycardic, no murmurs, bibasilar crackles, 2+ bilateral lower extremity edema, + JVD.  For his acute on chronic diastolic heart failure, suspect due to A-fib with RVR.  He is volume overloaded, will diurese with IV Lasix  40 mg twice daily.  Once more euvolemic, would recommend cardioversion.  He reports some missed doses of Eliquis , will need TEE prior to cardioversion.  Stressed the importance of compliance with his medications.  Currently appears rate controlled.  Will update echocardiogram.  Lonni LITTIE Nanas, MD

## 2024-01-20 NOTE — ED Notes (Signed)
 Hourly rounding complete. Pt alert or resting, no distress noted, offered toileting and diet as appropriate. Side rails up, call light within reach. Pt denies pain or further needs at this time.

## 2024-01-20 NOTE — ED Notes (Signed)
 Hourly rounding complete. Pt alert or resting, no distress noted, offered toileting and diet as appropriate. Side rails up, call light within reach. Pt denies pain or further needs at this time.  Family at bedside

## 2024-01-20 NOTE — ED Provider Notes (Signed)
 [place] EMERGENCY DEPARTMENT AT [facility] Provider Note   CSN: [id number] Arrival date & time: 01/20/24  1124     Patient presents with: Shortness of Breath and Atrial Fibrillation   Keith [name] is a 83 y.o. male.   HPI Patient presents for abdominal pain, shortness of breath, generalized weakness.  Medical history includes HTN, CHF, CKD, DVT, GERD.  His cardiologist is Dr. Ladona.  He was diagnosed with DVT 1 year ago.  He was placed on anticoagulation at the time.  He was recently diagnosed with atrial fibrillation.  He was advised to resume Eliquis , 5 mg twice daily.  He was last seen in cardiology office on 7/31.  Plan was for cardioversion after at least 3 weeks on Eliquis .  Patient states that he has been adherent to his prescribed medication.  Per EMS, his family was unsure of that.  He had a fall 3 weeks ago and hurt his left shoulder.  He has been having worsening generalized weakness.  He has been ambulating with a walker.  For the past 4 weeks, he has been staying with family.  Earlier today, he had some pain in his left upper quadrant.  Because of the abdominal pain, he felt short of breath.  For this reason, EMS was called.  With EMS, his pain had resolved.  Patient denies any pain currently other than his ongoing left shoulder pain.  In addition to Eliquis , patient is prescribed Bumex .  He states that he is not taking this in the past 2 days because he had a wedding to attend.    Prior to Admission medications   Medication Sig Start Date End Date Taking? Authorizing Provider  allopurinol (ZYLOPRIM) 100 MG tablet Take 100 mg by mouth as needed. 06/20/23   [provider]  apixaban  (ELIQUIS ) 2.5 MG TABS tablet Take 1 tablet (2.5 mg total) by mouth 2 (two) times daily. 12/20/23   Ladona Heinz, MD  atorvastatin  (LIPITOR) 10 MG tablet TAKE 1 TABLET(10 MG) BY MOUTH DAILY 10/16/23   Ladona Heinz, MD  bumetanide  (BUMEX ) 0.5 MG tablet Take 1 tablet (0.5 mg total) by  mouth every morning. 01/17/24   Ladona Heinz, MD  carbidopa -levodopa  (SINEMET  IR) 25-100 MG tablet Take 1 tablet by mouth 3 (three) times daily before meals. 01/02/24   Penumalli, Vikram R, MD  cloNIDine  (CATAPRES  - DOSED IN MG/24 HR) 0.1 mg/24hr patch APPLY 1 PATCH ONTO THE SKIN EVERY WEEK 08/27/23   Ladona Heinz, MD  finasteride  (PROSCAR ) 5 MG tablet Take 5 mg by mouth daily. 04/18/22   [provider]  HYDROcodone -acetaminophen  (NORCO) 10-325 MG tablet Take 1 tablet by mouth every 6 (six) hours as needed.    [provider]  meclizine (ANTIVERT) 25 MG tablet Take 25 mg by mouth daily as needed for dizziness.    [provider]  Nebivolol  HCl (BYSTOLIC ) 20 MG TABS Take 20 mg by mouth daily.    [provider]  pantoprazole  (PROTONIX ) 40 MG tablet Take 40 mg by mouth daily.    [provider]  tamsulosin  (FLOMAX ) 0.4 MG CAPS capsule Take 0.4 mg by mouth.    [provider]    Allergies: Amlodipine, Labetalol, Lisinopril, Penicillins, Allopurinol, Amoxicillin-pot clavulanate, Azithromycin, Benzonatate, Cefuroxime, Codeine, Epinephrine, Procaine hcl, Valsartan, and Prednisone    Review of Systems  Constitutional:  Positive for fatigue.  Respiratory:  Positive for shortness of breath.   Gastrointestinal:  Positive for abdominal pain.  Musculoskeletal:  Positive for arthralgias.  Neurological:  Positive for weakness (Generalized).  All other systems reviewed and are negative.   Updated Vital Signs BP (!) 164/85   Pulse 88   Temp (!) 97.4 F (36.3 C) (Oral)   Resp 18   Ht 5' 6 (1.676 m)   Wt 89.4 kg   SpO2 92%   BMI 31.80 kg/m   Physical Exam Vitals and nursing note reviewed.  Constitutional:      General: He is not in acute distress.    Appearance: He is well-developed. He is not ill-appearing, toxic-appearing or diaphoretic.  HENT:     Head: Normocephalic and atraumatic.     Mouth/Throat:     Mouth: Mucous membranes are moist.   Eyes:     Conjunctiva/sclera: Conjunctivae normal.  Cardiovascular:     Rate and Rhythm: Tachycardia present. Rhythm irregular.     Heart sounds: No murmur heard. Pulmonary:     Effort: Pulmonary effort is normal. No respiratory distress.     Breath sounds: Normal breath sounds.  Abdominal:     Palpations: Abdomen is soft.     Tenderness: There is no abdominal tenderness.  Musculoskeletal:        General: No swelling. Normal range of motion.     Cervical back: Normal range of motion and neck supple.  Skin:    General: Skin is warm and dry.     Coloration: Skin is not cyanotic or pale.  Neurological:     General: No focal deficit present.     Mental Status: He is alert and oriented to person, place, and time.  Psychiatric:        Mood and Affect: Mood normal.        Behavior: Behavior normal.     (all labs ordered are listed, but only abnormal results are displayed) Labs Reviewed  BASIC METABOLIC PANEL WITH GFR - Abnormal; Notable for the following components:      Result Value   Glucose, Bld 110 (*)    BUN 24 (*)    Creatinine, Ser 1.67 (*)    GFR, Estimated 40 (*)    All other components within normal limits  HEPATIC FUNCTION PANEL - Abnormal; Notable for the following components:   Total Protein 6.4 (*)    Total Bilirubin 1.4 (*)    Bilirubin, Direct 0.3 (*)    Indirect Bilirubin 1.1 (*)    All other components within normal limits  BRAIN NATRIURETIC PEPTIDE - Abnormal; Notable for the following components:   B Natriuretic Peptide 2,227.1 (*)    All other components within normal limits  CBC WITH DIFFERENTIAL/PLATELET - Abnormal; Notable for the following components:   RDW 15.7 (*)    Neutro Abs 8.2 (*)    All other components within normal limits  CBG MONITORING, ED - Abnormal; Notable for the following components:   Glucose-Capillary 109 (*)    All other components within normal limits  TROPONIN I (HIGH SENSITIVITY) - Abnormal; Notable for the following  components:   Troponin I (High Sensitivity) 18 (*)    All other components within normal limits  MAGNESIUM  LIPASE, BLOOD  URINALYSIS, ROUTINE W REFLEX MICROSCOPIC  TROPONIN I (HIGH SENSITIVITY)    EKG: EKG Interpretation Date/Time:  Monday January 20 2024 11:34:10 EDT Ventricular Rate:  108 PR Interval:    QRS Duration:  105 QT Interval:  390 QTC Calculation: 464 R Axis:   31  Text Interpretation: Atrial fibrillation Paired ventricular premature complexes Anterior infarct, old Confirmed by Melvenia,  Estus Krakowski [phone]) on 01/20/2024 12:07:16 PM  Radiology: CT CHEST ABDOMEN PELVIS WO CONTRAST Result Date: 01/20/2024 CLINICAL DATA:  The unintended weight loss EXAM: CT CHEST, ABDOMEN AND PELVIS WITHOUT CONTRAST TECHNIQUE: Multidetector CT imaging of the chest, abdomen and pelvis was performed following the standard protocol without IV contrast. RADIATION DOSE REDUCTION: This exam was performed according to the departmental dose-optimization program which includes automated exposure control, adjustment of the mA and/or kV according to patient size and/or use of iterative reconstruction technique. COMPARISON:  Same day chest radiograph and prior studies FINDINGS: CT CHEST FINDINGS Cardiovascular: Normal caliber aorta. Scattered aortic calcifications. No pericardial effusion. Mediastinum/Nodes: No lymphadenopathy. Lungs/Pleura: Small to moderate bilateral pleural effusions with associated bibasilar atelectasis/consolidation. Interlobular septal thickening, suggestive of mild pulmonary edema. Tiny 0.5 cm right middle lobe pulmonary nodule, unchanged from prior. Musculoskeletal: No acute osseous findings. CT ABDOMEN PELVIS FINDINGS Hepatobiliary: Unremarkable. Pancreas: Unremarkable. Spleen: Unremarkable. Adrenals/Urinary Tract: Adrenal glands are unremarkable. Symmetric nephrograms. No hydronephrosis or nephrolithiasis. 8.2 x 5.4 cm left renal cyst. Stomach/Bowel: No evidence of bowel obstruction or  inflammation. The visualized appendix is unremarkable. Vascular/Lymphatic: Aortic atherosclerosis. No enlarged abdominal or pelvic lymph nodes. Reproductive: Enlarged prostate, measuring approximately 5.7 by 5.4 cm. Scattered prostatic calcifications, similar to prior. Other: No free air free fluid. Fat containing bilateral inguinal and umbilical hernias. Musculoskeletal: No acute osseous findings. IMPRESSION: 1. Small to moderate bilateral pleural effusions with likely mild interstitial pulmonary edema. 2. Stable additional chronic and incidental findings as noted above. Electronically Signed   By: Michaeline Blanch M.D.   On: 01/20/2024 14:41   DG Shoulder Left Result Date: 01/20/2024 EXAM: 1 VIEW XRAY OF THE LEFT SHOULDER 01/20/2024 01:23:00 PM COMPARISON: None available. CLINICAL HISTORY: L shoulder pain. FINDINGS: BONES AND JOINTS: No fracture. Moderate osteoarthritis of the acromioclavicular joint. Superior subluxation of humeral head and remodeling of the undersurface of the acromion . SOFT TISSUES: No abnormal calcifications. Visualized lung is unremarkable. IMPRESSION: 1. No fracture. 2. Superior subluxation of the humeral head and remodeling of the undersurface of the acromion, likely reflecting chronic rotator cuff pathology. Electronically signed by: Selinda Blue MD 01/20/2024 02:14 PM EDT RP Workstation: [id number]   DG Chest Portable 1 View Result Date: 01/20/2024 EXAM: 1 VIEW XRAY OF THE CHEST 01/20/2024 12:01:00 PM COMPARISON: 05/25/2023 CLINICAL HISTORY: SOB, atrial fibrillation FINDINGS: LUNGS AND PLEURA: Small bilateral pleural effusions. Mild pulmonary edema. Bibasilar atelectasis. HEART AND MEDIASTINUM: Borderline mild cardiomegaly. BONES AND SOFT TISSUES: No acute osseous abnormality. IMPRESSION: 1. Mild CHF with small bilateral pleural effusions and bibasilar atelectasis. Electronically signed by: Selinda Blue MD 01/20/2024 12:05 PM EDT RP Workstation: [id number]     Procedures   Medications  Ordered in the ED  furosemide  (LASIX ) injection 40 mg (40 mg Intravenous Given 01/20/24 1303)                                    Medical Decision Making Amount and/or Complexity of Data Reviewed Labs: ordered. Radiology: ordered.  Risk Prescription drug management.   This patient presents to the ED for concern of generalized weakness, shortness of breath, this involves an extensive number of treatment options, and is a complaint that carries with it a high risk of complications and morbidity.  The differential diagnosis includes CHF, ACS, arrhythmia, deconditioning, metabolic derangements, infection   Co morbidities / Chronic conditions that complicate the patient evaluation  HTN, CHF, CKD, DVT, GERD   Additional history  obtained:  Additional history obtained from EMR External records from outside source obtained and reviewed including patient's grandson   Lab Tests:  I Ordered, and personally interpreted labs.  The pertinent results include: Markedly elevated BNP consistent with CHF exacerbation.   Imaging Studies ordered:  I ordered imaging studies including x-ray of chest and left shoulder; CT of chest, abdomen, pelvis I independently visualized and interpreted imaging which showed edema with small bilateral pleural effusions consistent with volume overload. I agree with the radiologist interpretation   Cardiac Monitoring: / EKG:  The patient was maintained on a cardiac monitor.  I personally viewed and interpreted the cardiac monitored which showed an underlying rhythm of: Atrial fibrillation   Problem List / ED Course / Critical interventions / Medication management  Patient presenting for neurolyse weakness, transient abdominal pain, and shortness of breath.  Recent diagnosis of atrial fibrillation with questionable adherence to his Eliquis .  On arrival in the ED, he is in atrial fibrillation.  Heart rate is in the range of 100s to 110s.  It does not increase when  he sits up.  His current breathing is unlabored.  Lungs are clear to auscultation.  He endorses a left shoulder pain which he states has been ongoing for the past 3 weeks following a recent fall.  He denies any other areas of discomfort.  Abdomen is soft and nontender.  Patient was placed on monitor.  Workup was initiated.  Lab work was notable for markedly elevated BNP.  Imaging studies also consistent with pulmonary edema and bilateral pleural effusions consistent with volume overload.  I spoke with cardiologist on-call, Dr. Kate, who will see the patient in consult.  He does recommend hospitalist admission for diuresis.  Cardiology to determine further management of atrial fibrillation following discussion with patient. I ordered medication including Lasix  for diuresis Reevaluation of the patient after these medicines showed that the patient stayed the same I have reviewed the patients home medicines and have made adjustments as needed   Consultations Obtained:  I requested consultation with the allergist, Dr. Kate,  and discussed lab and imaging findings as well as pertinent plan - they recommend: Hospitalist admission.  Cardiology will see in consult   Social Determinants of Health:  Lives at home with family     Final diagnoses:  Acute on chronic congestive heart failure, unspecified heart failure type [facility])  Atrial fibrillation with [facility])    ED Discharge Orders     None          Melvenia Motto, MD 01/20/24 1503

## 2024-01-20 NOTE — H&P (Signed)
 History and Physical   Keith [name] FMW:[id number] DOB: 1941/01/20 DOA: 01/20/2024  PCP: Gerome Brunet, DO   Patient coming from: Home  Chief Complaint: SOB  HPI: Keith [name] is a 83 y.o. male with medical history significant of hypertension, hyperlipidemia, GERD, CKD 3, atrial fibrillation, CAD, chronic diastolic CHF, new Parkinson disease, obesity, DVT, BPH, gout presenting with shortness of breath and weakness.  Patient presenting with worsening weakness and shortness of breath.  Patient was recently diagnosed with atrial fibrillation and saw cardiology on 7/31.  He was started on anticoagulation for at least 3 weeks with plan for cardioversion tomorrow.  Family has become concerned about this and have reportedly canceled that.  Patient a fall few weeks ago and hurt his shoulder.  Patient has missed at least 1 dose of Bumex  in the past week.  Also missed a couple doses of Eliquis  recently. Has had some abdominal pain and shortness of breath today as well as some weakness.  Denies fevers, chills, chest pain, constipation, diarrhea, nausea, vomiting.  ED Course: Vital signs in the ED notable for blood pressure in the 100s-160s systolic, heart rate in the 60s-100s.  Lab workup included CMP with BUN 24, creatinine stable 1.67, glucose 110, protein 6.4, albumin 1.4.  Magnesium normal.  CBC within normal limits.  Troponin trend 18, 19.  Lipase normal.  BNP elevated to 2227.  Shoulder x-ray showed no acute normality.  Chest x-ray showed mild CHF changes with bilateral effusions.  CT chest abdomen pelvis showed small to moderate bilateral effusions with mild pulmonary edema.  Patient is a 40 mg IV Lasix  in the ED.  Cardiology consulted and will see the patient.  Left shoulder x-ray showed no acute Sharol.  Review of Systems: As per HPI otherwise all other systems reviewed and are negative.  Past Medical History:  Diagnosis Date   Abnormal nuclear stress test 02/24/2010    Nuclear stress 02/24/10 Infero lateral ischemia     Allergic rhinitis, cause unspecified    skin test pos 02-23-2008   Angina pectoris (HCC)    CAD (coronary artery disease)    CKD (chronic kidney disease) stage 3, GFR 30-59 ml/min (HCC) 07/23/2018   Dyspnea on exertion 07/23/2018   GERD (gastroesophageal reflux disease)    Hypercholesteremia 07/23/2018   Kidney stone    Migraine    Nephrolithiasis    Parkinson's disease (HCC)    Resistant hypertension 07/23/2018   TIA (transient ischemic attack)     Past Surgical History:  Procedure Laterality Date   bilateral tkr     EXTRACORPOREAL SHOCK WAVE LITHOTRIPSY Right 03/02/2019   Procedure: EXTRACORPOREAL SHOCK WAVE LITHOTRIPSY (ESWL);  Surgeon: Carolee Sherwood JONETTA DOUGLAS, MD;  Location: [facility];  Service: Urology;  Laterality: Right;   HERNIA REPAIR     reset nasal fracture     TONSILLECTOMY      Social History  reports that he has never smoked. He has never used smokeless tobacco. He reports that he does not drink alcohol and does not use drugs.  Allergies  Allergen Reactions   Amlodipine Swelling   Amoxicillin-Pot Clavulanate Swelling    Other reaction(s): swelling of the neck ad throat   Cefuroxime Swelling and Rash    Other reaction(s): facial swelling   Labetalol Swelling    Swollen lips    Lisinopril Cough   Penicillins Rash   Procaine Hcl Hypertension   Valsartan Other (See Comments)    Other reaction(s): cough   Azithromycin  Other reaction(s): Unknown   Benzonatate     Other reaction(s): Unknown   Allopurinol Rash   Codeine Itching and Rash   Epinephrine Palpitations   Prednisone Rash    Family History  Problem Relation Age of Onset   Allergy (severe) Mother    Heart attack Father    Stroke Father    COPD Brother    Heart attack Brother    Tuberculosis Other        grandmother  Reviewed on admission  Prior to Admission medications   Medication Sig Start Date End Date Taking? Authorizing Provider   allopurinol (ZYLOPRIM) 100 MG tablet Take 100 mg by mouth as needed. 06/20/23   [provider]  apixaban  (ELIQUIS ) 2.5 MG TABS tablet Take 1 tablet (2.5 mg total) by mouth 2 (two) times daily. 12/20/23   Ladona Heinz, MD  atorvastatin  (LIPITOR) 10 MG tablet TAKE 1 TABLET(10 MG) BY MOUTH DAILY 10/16/23   Ladona Heinz, MD  bumetanide  (BUMEX ) 0.5 MG tablet Take 1 tablet (0.5 mg total) by mouth every morning. 01/17/24   Ladona Heinz, MD  carbidopa -levodopa  (SINEMET  IR) 25-100 MG tablet Take 1 tablet by mouth 3 (three) times daily before meals. 01/02/24   Penumalli, Vikram R, MD  cloNIDine  (CATAPRES  - DOSED IN MG/24 HR) 0.1 mg/24hr patch APPLY 1 PATCH ONTO THE SKIN EVERY WEEK 08/27/23   Ladona Heinz, MD  finasteride  (PROSCAR ) 5 MG tablet Take 5 mg by mouth daily. 04/18/22   [provider]  HYDROcodone -acetaminophen  (NORCO) 10-325 MG tablet Take 1 tablet by mouth every 6 (six) hours as needed.    [provider]  meclizine (ANTIVERT) 25 MG tablet Take 25 mg by mouth daily as needed for dizziness.    [provider]  Nebivolol  HCl (BYSTOLIC ) 20 MG TABS Take 20 mg by mouth daily.    [provider]  pantoprazole  (PROTONIX ) 40 MG tablet Take 40 mg by mouth daily.    [provider]  tamsulosin  (FLOMAX ) 0.4 MG CAPS capsule Take 0.4 mg by mouth.    [provider]    Physical Exam: Vitals:   01/20/24 1245 01/20/24 1315 01/20/24 1400 01/20/24 1445  BP: (!) 142/92 104/63 (!) 156/88 (!) 164/85  Pulse: 60 (!) 101 64 88  Resp: 20 (!) 22 (!) 23 18  Temp:      TempSrc:      SpO2: 93% 93% 97% 92%  Weight:      Height:        Physical Exam Constitutional:      General: He is not in acute distress.    Appearance: Normal appearance.  HENT:     Head: Normocephalic and atraumatic.     Mouth/Throat:     Mouth: Mucous membranes are moist.     Pharynx: Oropharynx is clear.  Eyes:     Extraocular Movements: Extraocular movements intact.     Pupils: Pupils  are equal, round, and reactive to light.  Cardiovascular:     Rate and Rhythm: Normal rate. Rhythm irregular.     Pulses: Normal pulses.     Heart sounds: Normal heart sounds.  Pulmonary:     Effort: Pulmonary effort is normal. No respiratory distress.     Breath sounds: Rales present.  Abdominal:     General: Bowel sounds are normal. There is no distension.     Palpations: Abdomen is soft.     Tenderness: There is no abdominal tenderness.  Musculoskeletal:  General: No swelling or deformity.     Right lower leg: Edema present.     Left lower leg: Edema present.  Skin:    General: Skin is warm and dry.  Neurological:     General: No focal deficit present.     Mental Status: Mental status is at baseline.    Labs on Admission: I have personally reviewed following labs and imaging studies  CBC: Recent Labs  Lab 01/20/24 1149  WBC 10.5  NEUTROABS 8.2*  HGB 13.3  HCT 42.0  MCV 93.1  PLT 159    Basic Metabolic Panel: Recent Labs  Lab 01/20/24 1149  NA 140  K 4.0  CL 106  CO2 25  GLUCOSE 110*  BUN 24*  CREATININE 1.67*  CALCIUM  9.3  MG 1.9    GFR: Estimated Creatinine Clearance: 34.5 mL/min (A) (by C-G formula based on SCr of 1.67 mg/dL (H)).  Liver Function Tests: Recent Labs  Lab 01/20/24 1149  AST 20  ALT 7  ALKPHOS 51  BILITOT 1.4*  PROT 6.4*  ALBUMIN 3.5    Urine analysis:    Component Value Date/Time   COLORURINE YELLOW 01/20/2024 1141   APPEARANCEUR CLEAR 01/20/2024 1141   LABSPEC 1.015 01/20/2024 1141   PHURINE 5.0 01/20/2024 1141   GLUCOSEU NEGATIVE 01/20/2024 1141   HGBUR NEGATIVE 01/20/2024 1141   BILIRUBINUR NEGATIVE 01/20/2024 1141   KETONESUR NEGATIVE 01/20/2024 1141   PROTEINUR NEGATIVE 01/20/2024 1141   NITRITE NEGATIVE 01/20/2024 1141   LEUKOCYTESUR NEGATIVE 01/20/2024 1141    Radiological Exams on Admission: CT CHEST ABDOMEN PELVIS WO CONTRAST Result Date: 01/20/2024 CLINICAL DATA:  The unintended weight loss EXAM:  CT CHEST, ABDOMEN AND PELVIS WITHOUT CONTRAST TECHNIQUE: Multidetector CT imaging of the chest, abdomen and pelvis was performed following the standard protocol without IV contrast. RADIATION DOSE REDUCTION: This exam was performed according to the departmental dose-optimization program which includes automated exposure control, adjustment of the mA and/or kV according to patient size and/or use of iterative reconstruction technique. COMPARISON:  Same day chest radiograph and prior studies FINDINGS: CT CHEST FINDINGS Cardiovascular: Normal caliber aorta. Scattered aortic calcifications. No pericardial effusion. Mediastinum/Nodes: No lymphadenopathy. Lungs/Pleura: Small to moderate bilateral pleural effusions with associated bibasilar atelectasis/consolidation. Interlobular septal thickening, suggestive of mild pulmonary edema. Tiny 0.5 cm right middle lobe pulmonary nodule, unchanged from prior. Musculoskeletal: No acute osseous findings. CT ABDOMEN PELVIS FINDINGS Hepatobiliary: Unremarkable. Pancreas: Unremarkable. Spleen: Unremarkable. Adrenals/Urinary Tract: Adrenal glands are unremarkable. Symmetric nephrograms. No hydronephrosis or nephrolithiasis. 8.2 x 5.4 cm left renal cyst. Stomach/Bowel: No evidence of bowel obstruction or inflammation. The visualized appendix is unremarkable. Vascular/Lymphatic: Aortic atherosclerosis. No enlarged abdominal or pelvic lymph nodes. Reproductive: Enlarged prostate, measuring approximately 5.7 by 5.4 cm. Scattered prostatic calcifications, similar to prior. Other: No free air free fluid. Fat containing bilateral inguinal and umbilical hernias. Musculoskeletal: No acute osseous findings. IMPRESSION: 1. Small to moderate bilateral pleural effusions with likely mild interstitial pulmonary edema. 2. Stable additional chronic and incidental findings as noted above. Electronically Signed   By: Michaeline Blanch M.D.   On: 01/20/2024 14:41   DG Shoulder Left Result Date:  01/20/2024 EXAM: 1 VIEW XRAY OF THE LEFT SHOULDER 01/20/2024 01:23:00 PM COMPARISON: None available. CLINICAL HISTORY: L shoulder pain. FINDINGS: BONES AND JOINTS: No fracture. Moderate osteoarthritis of the acromioclavicular joint. Superior subluxation of humeral head and remodeling of the undersurface of the acromion . SOFT TISSUES: No abnormal calcifications. Visualized lung is unremarkable. IMPRESSION: 1. No fracture. 2. Superior subluxation  of the humeral head and remodeling of the undersurface of the acromion, likely reflecting chronic rotator cuff pathology. Electronically signed by: Selinda Blue MD 01/20/2024 02:14 PM EDT RP Workstation: [id number]   DG Chest Portable 1 View Result Date: 01/20/2024 EXAM: 1 VIEW XRAY OF THE CHEST 01/20/2024 12:01:00 PM COMPARISON: 05/25/2023 CLINICAL HISTORY: SOB, atrial fibrillation FINDINGS: LUNGS AND PLEURA: Small bilateral pleural effusions. Mild pulmonary edema. Bibasilar atelectasis. HEART AND MEDIASTINUM: Borderline mild cardiomegaly. BONES AND SOFT TISSUES: No acute osseous abnormality. IMPRESSION: 1. Mild CHF with small bilateral pleural effusions and bibasilar atelectasis. Electronically signed by: Selinda Blue MD 01/20/2024 12:05 PM EDT RP Workstation: [id number]    EKG: Independently reviewed.  Atrial fibrillation at 108 bpm.  PVCs.  Nonspecific T wave changes.  Assessment/Plan Principal Problem:   Acute on chronic diastolic (congestive) heart failure (HCC) Active Problems:   Essential hypertension   GERD   Obesity (BMI 30.0-34.9)   Moderate obesity   CKD (chronic kidney disease) stage 3, GFR 30-59 ml/min (HCC)   Atherosclerotic heart disease of native coronary artery without angina pectoris   Benign prostatic hyperplasia   Chronic diastolic heart failure (HCC)   Gout   High cholesterol   Acute on chronic diastolic CHF > Patient presenting with some shortness of breath. Missed at least 1 dose of Bumex  in the past week. > Changes of CHF on chest  x-ray.  Exam significant for edema and rales. > BNP elevated to 2227.  Troponin flat at 18, 19.  Magnesium normal. > Received Lasix  40 mg IV in the ED. - Monitor on telemetry overnight - Cardiology consulted in the ED, appreciate recommendations,-continue with Lasix  40 mg IV twice daily - Strict I's and O's, daily weights - Echocardiogram - Trend renal function and electrolytes - Continue home Nebivolol   Atrial fibrillation > Diagnosed recently.  Saw cardiology 7/31.  Started on anticoagulation at that time for planned cardioversion.  Missed a few doses of Eliquis  recently. > Family are concerned about cardioversion and this was canceled, originally due to be done tomorrow. > EDP has consulted cardiology concerning this for further plan. - Appreciate cardiology recommendations and assistance - On telemetry as above - Continue home Eliquis  - Continue home Nebivolol   Hypertension - Diuresis as above - Continue clonidine  patch - Continue home Nebivolol   Hyperlipidemia - Continue home atorvastatin   GERD - Continue PPI  CKD 3 > Creatinine stable 1.67 in the ED - Trend renal function and electrolytes  CAD - Continue home Eliquis , Nebivolol , atorvastatin   Parkinson's disease > Recently diagnosed. - Continue home carbidopa /levodopa   Obesity - Noted  History of DVT in 2024 - Noted  BPH - Continue tamsulosin , finasteride   Gout - Takes allopurinol as needed  DVT prophylaxis: Eliquis  Code Status:   Full Family Communication:  Updated at bedside Disposition Plan:   Patient is from:  Home  Anticipated DC to:  Home  Anticipated DC date:  To 5 days  Anticipated DC barriers: None  Consults called:  Cardiology Admission status:  Inpatient, telemetry  Severity of Illness: The appropriate patient status for this patient is INPATIENT. Inpatient status is judged to be reasonable and necessary in order to provide the required intensity of service to ensure the patient's  safety. The patient's presenting symptoms, physical exam findings, and initial radiographic and laboratory data in the context of their chronic comorbidities is felt to place them at high risk for further clinical deterioration. Furthermore, it is not anticipated that the patient will be medically stable for discharge  from the hospital within 2 midnights of admission.   * I certify that at the point of admission it is my clinical judgment that the patient will require inpatient hospital care spanning beyond 2 midnights from the point of admission due to high intensity of service, high risk for further deterioration and high frequency of surveillance required.DEWAINE Marsa KATHEE Seena MD [facility]  How to contact the [facility] Attending or Consulting provider 7A - 7P or covering provider during after hours 7P -7A, for this patient?   Check the care team in [facility] and look for a) attending/consulting [facility] provider listed and b) the [facility] team listed Log into [email or web address] and use [facility] universal password to access. If you do not have the password, please contact the hospital operator. Locate the [facility] provider you are looking for under [facility] and page to a number that you can be directly reached. If you still have difficulty reaching the provider, please page the [facility] (Director on Call) for the Hospitalists listed on amion for assistance.  01/20/2024, 3:42 PM

## 2024-01-20 NOTE — ED Triage Notes (Addendum)
 BIB EMS for SOB and abdominal pain. Pt recently diagnosed with Afib but not consistently taking medication according to family. Pt has hx of Parkinson's. EMS noted frequent PVC's and afib. Pt received 500 NS bolus en route.

## 2024-01-21 ENCOUNTER — Inpatient Hospital Stay (HOSPITAL_COMMUNITY)

## 2024-01-21 ENCOUNTER — Ambulatory Visit (HOSPITAL_COMMUNITY): Admission: RE | Admit: 2024-01-21 | Source: Home / Self Care | Admitting: Cardiology

## 2024-01-21 ENCOUNTER — Encounter (HOSPITAL_COMMUNITY): Admission: RE | Payer: Self-pay | Source: Home / Self Care

## 2024-01-21 DIAGNOSIS — I509 Heart failure, unspecified: Secondary | ICD-10-CM

## 2024-01-21 DIAGNOSIS — I5031 Acute diastolic (congestive) heart failure: Secondary | ICD-10-CM

## 2024-01-21 DIAGNOSIS — I10 Essential (primary) hypertension: Secondary | ICD-10-CM | POA: Diagnosis not present

## 2024-01-21 DIAGNOSIS — I4891 Unspecified atrial fibrillation: Secondary | ICD-10-CM | POA: Diagnosis not present

## 2024-01-21 DIAGNOSIS — I5033 Acute on chronic diastolic (congestive) heart failure: Secondary | ICD-10-CM | POA: Diagnosis not present

## 2024-01-21 LAB — ECHOCARDIOGRAM COMPLETE
AR max vel: 1.68 cm2
AV Area VTI: 1.63 cm2
AV Area mean vel: 1.66 cm2
AV Mean grad: 4.3 mmHg
AV Peak grad: 6.7 mmHg
Ao pk vel: 1.3 m/s
Height: 66 in
S' Lateral: 4.9 cm
Weight: 3015.89 [oz_av]

## 2024-01-21 LAB — COMPREHENSIVE METABOLIC PANEL WITH GFR
ALT: <5 U/L (ref 0–44)
AST: 10 U/L — ABNORMAL LOW (ref 15–41)
Albumin: 3.3 g/dL — ABNORMAL LOW (ref 3.5–5.0)
Alkaline Phosphatase: 47 U/L (ref 38–126)
Anion gap: 12 (ref 5–15)
BUN: 22 mg/dL (ref 8–23)
CO2: 24 mmol/L (ref 22–32)
Calcium: 8.8 mg/dL — ABNORMAL LOW (ref 8.9–10.3)
Chloride: 105 mmol/L (ref 98–111)
Creatinine, Ser: 1.7 mg/dL — ABNORMAL HIGH (ref 0.61–1.24)
GFR, Estimated: 39 mL/min — ABNORMAL LOW (ref >60)
Glucose, Bld: 94 mg/dL (ref 70–99)
Potassium: 3.6 mmol/L (ref 3.5–5.1)
Sodium: 141 mmol/L (ref 135–145)
Total Bilirubin: 1.3 mg/dL — ABNORMAL HIGH (ref 0.0–1.2)
Total Protein: 4.8 g/dL — ABNORMAL LOW (ref 6.5–8.1)

## 2024-01-21 LAB — CBC
HCT: 40.6 % (ref 39.0–52.0)
Hemoglobin: 13.1 g/dL (ref 13.0–17.0)
MCH: 29.2 pg (ref 26.0–34.0)
MCHC: 32.3 g/dL (ref 30.0–36.0)
MCV: 90.6 fL (ref 80.0–100.0)
Platelets: 159 K/uL (ref 150–400)
RBC: 4.48 MIL/uL (ref 4.22–5.81)
RDW: 15.4 % (ref 11.5–15.5)
WBC: 11.2 K/uL — ABNORMAL HIGH (ref 4.0–10.5)
nRBC: 0 % (ref 0.0–0.2)

## 2024-01-21 LAB — MAGNESIUM: Magnesium: 1.9 mg/dL (ref 1.7–2.4)

## 2024-01-21 SURGERY — CARDIOVERSION (CATH LAB)
Anesthesia: General

## 2024-01-21 MED ORDER — LACTULOSE 10 GM/15ML PO SOLN
20.0000 g | Freq: Three times a day (TID) | ORAL | Status: DC
  Administered 2024-01-21 – 2024-01-23 (×4): 20 g via ORAL
  Filled 2024-01-21 (×7): qty 30

## 2024-01-21 MED ORDER — PERFLUTREN LIPID MICROSPHERE
1.0000 mL | INTRAVENOUS | Status: AC | PRN
  Administered 2024-01-21: 3 mL via INTRAVENOUS

## 2024-01-21 NOTE — Progress Notes (Signed)
 Rounding Note   Patient Name: Keith [name] Date of Encounter: 01/21/2024  [facility] Cardiologist: Gordy Bergamo, MD   Subjective Still short of breath.    Scheduled Meds:  apixaban   2.5 mg Oral BID   atorvastatin   10 mg Oral Daily   carbidopa -levodopa   1 tablet Oral TID AC   cloNIDine   0.1 mg Transdermal Weekly   finasteride   5 mg Oral Daily   furosemide   40 mg Intravenous BID   nebivolol   20 mg Oral Daily   pantoprazole   40 mg Oral Daily   sodium chloride  flush  3 mL Intravenous Q12H   tamsulosin   0.4 mg Oral Daily   Continuous Infusions:  PRN Meds: acetaminophen  **OR** acetaminophen , polyethylene glycol   Vital Signs  Vitals:   01/20/24 2349 01/21/24 0334 01/21/24 0354 01/21/24 0715  BP: (!) 133/97 (!) 143/94  (!) 153/97  Pulse: 85 90  86  Resp: 20 19  20   Temp: 97.7 F (36.5 C) 97.9 F (36.6 C)  97.6 F (36.4 C)  TempSrc: Oral Oral  Oral  SpO2: 97% 95%  95%  Weight:   85.5 kg   Height:        Intake/Output Summary (Last 24 hours) at 01/21/2024 1020 Last data filed at 01/21/2024 0930 Gross per 24 hour  Intake 358 ml  Output 2920 ml  Net -2562 ml      01/21/2024    3:54 AM 01/20/2024    6:44 PM 01/20/2024   11:39 AM  Last 3 Weights  Weight (lbs) 188 lb 7.9 oz 195 lb 5.2 oz 197 lb  Weight (kg) 85.5 kg 88.6 kg 89.359 kg      Telemetry Atrial fibrillation. PVCs. Rate <100 bpm. - Personally Reviewed  ECG  01/20/24:  Atrial fibrillation.  Rate 108 bpm.  PVCs.  Cannot rule out prior anterior MI.  - Personally Reviewed  Physical Exam  VS:  BP (!) 153/97 (BP Location: Right Arm)   Pulse 86   Temp 97.6 F (36.4 C) (Oral)   Resp 20   Ht 5' 6 (1.676 m)   Wt 85.5 kg   SpO2 95%   BMI 30.42 kg/m  , BMI Body mass index is 30.42 kg/m. GENERAL:  Well appearing HEENT: Pupils equal round and reactive, fundi not visualized, oral mucosa unremarkable NECK:  No jugular venous distention, waveform within normal limits, carotid upstroke brisk and symmetric,  no bruits, no thyromegaly LUNGS:  Diminished at bases  HEART:  Irregularly irregular.  PMI not displaced or sustained,S1 and S2 within normal limits, no S3, no S4, no clicks, no rubs, no murmurs ABD:  Flat, positive bowel sounds normal in frequency in pitch, no bruits, no rebound, no guarding, no midline pulsatile mass, no hepatomegaly, no splenomegaly EXT:  2 plus pulses throughout, 1+ LE edema, no cyanosis no clubbing SKIN:  No rashes no nodules NEURO:  Cranial nerves II through XII grossly intact, motor grossly intact throughout PSYCH:  Cognitively intact, oriented to person place and time   Labs High Sensitivity Troponin:   Recent Labs  Lab 01/20/24 1149 01/20/24 1412  TROPONINIHS 18* 19*     Chemistry Recent Labs  Lab 01/20/24 1149 01/21/24 0320  NA 140 141  K 4.0 3.6  CL 106 105  CO2 25 24  GLUCOSE 110* 94  BUN 24* 22  CREATININE 1.67* 1.70*  CALCIUM  9.3 8.8*  MG 1.9 1.9  PROT 6.4* 4.8*  ALBUMIN 3.5 3.3*  AST 20 10*  ALT  7 <5  ALKPHOS 51 47  BILITOT 1.4* 1.3*  GFRNONAA 40* 39*  ANIONGAP 9 12    Lipids No results for input(s): CHOL, TRIG, HDL, LABVLDL, LDLCALC, CHOLHDL in the last 168 hours.  Hematology Recent Labs  Lab 01/20/24 1149 01/21/24 0320  WBC 10.5 11.2*  RBC 4.51 4.48  HGB 13.3 13.1  HCT 42.0 40.6  MCV 93.1 90.6  MCH 29.5 29.2  MCHC 31.7 32.3  RDW 15.7* 15.4  PLT 159 159   Thyroid  No results for input(s): TSH, FREET4 in the last 168 hours.  BNP Recent Labs  Lab 01/20/24 1149  BNP 2,227.1*    DDimer No results for input(s): DDIMER in the last 168 hours.   Radiology  CT CHEST ABDOMEN PELVIS WO CONTRAST Result Date: 01/20/2024 CLINICAL DATA:  The unintended weight loss EXAM: CT CHEST, ABDOMEN AND PELVIS WITHOUT CONTRAST TECHNIQUE: Multidetector CT imaging of the chest, abdomen and pelvis was performed following the standard protocol without IV contrast. RADIATION DOSE REDUCTION: This exam was performed according to the  departmental dose-optimization program which includes automated exposure control, adjustment of the mA and/or kV according to patient size and/or use of iterative reconstruction technique. COMPARISON:  Same day chest radiograph and prior studies FINDINGS: CT CHEST FINDINGS Cardiovascular: Normal caliber aorta. Scattered aortic calcifications. No pericardial effusion. Mediastinum/Nodes: No lymphadenopathy. Lungs/Pleura: Small to moderate bilateral pleural effusions with associated bibasilar atelectasis/consolidation. Interlobular septal thickening, suggestive of mild pulmonary edema. Tiny 0.5 cm right middle lobe pulmonary nodule, unchanged from prior. Musculoskeletal: No acute osseous findings. CT ABDOMEN PELVIS FINDINGS Hepatobiliary: Unremarkable. Pancreas: Unremarkable. Spleen: Unremarkable. Adrenals/Urinary Tract: Adrenal glands are unremarkable. Symmetric nephrograms. No hydronephrosis or nephrolithiasis. 8.2 x 5.4 cm left renal cyst. Stomach/Bowel: No evidence of bowel obstruction or inflammation. The visualized appendix is unremarkable. Vascular/Lymphatic: Aortic atherosclerosis. No enlarged abdominal or pelvic lymph nodes. Reproductive: Enlarged prostate, measuring approximately 5.7 by 5.4 cm. Scattered prostatic calcifications, similar to prior. Other: No free air free fluid. Fat containing bilateral inguinal and umbilical hernias. Musculoskeletal: No acute osseous findings. IMPRESSION: 1. Small to moderate bilateral pleural effusions with likely mild interstitial pulmonary edema. 2. Stable additional chronic and incidental findings as noted above. Electronically Signed   By: Michaeline Blanch M.D.   On: 01/20/2024 14:41   DG Shoulder Left Result Date: 01/20/2024 EXAM: 1 VIEW XRAY OF THE LEFT SHOULDER 01/20/2024 01:23:00 PM COMPARISON: None available. CLINICAL HISTORY: L shoulder pain. FINDINGS: BONES AND JOINTS: No fracture. Moderate osteoarthritis of the acromioclavicular joint. Superior subluxation of  humeral head and remodeling of the undersurface of the acromion . SOFT TISSUES: No abnormal calcifications. Visualized lung is unremarkable. IMPRESSION: 1. No fracture. 2. Superior subluxation of the humeral head and remodeling of the undersurface of the acromion, likely reflecting chronic rotator cuff pathology. Electronically signed by: Selinda Blue MD 01/20/2024 02:14 PM EDT RP Workstation: [id number]   DG Chest Portable 1 View Result Date: 01/20/2024 EXAM: 1 VIEW XRAY OF THE CHEST 01/20/2024 12:01:00 PM COMPARISON: 05/25/2023 CLINICAL HISTORY: SOB, atrial fibrillation FINDINGS: LUNGS AND PLEURA: Small bilateral pleural effusions. Mild pulmonary edema. Bibasilar atelectasis. HEART AND MEDIASTINUM: Borderline mild cardiomegaly. BONES AND SOFT TISSUES: No acute osseous abnormality. IMPRESSION: 1. Mild CHF with small bilateral pleural effusions and bibasilar atelectasis. Electronically signed by: Selinda Blue MD 01/20/2024 12:05 PM EDT RP Workstation: [id number]    Cardiac Studies Echo pending  Patient Profile   83 y.o. male with hypertension, HFimpEF, CKD 3, DVT, atrial fibrillation,   Assessment & Plan   # HFimpEF:  #  Hypertension:  LVEF was previously reduced to 35-40%.  Subsequently improved to 50-55% on his most recent echocardiogram 06/2021.  He is now here with heart failure symptoms and atrial fibrillation.  Concerned that his EF may be reduced again.  Echo this admission is pending.  Yesterday he was a -2.5 L.  However intake is not recorded.  Will update the orders accordingly.  His weight is improving down from 89 kg on admission to 85.5 kg today.  Continue blood pressure has been quite labile ranging from the 90s systolic to most recently 153/97.  With Lasix  40 mg IV twice daily.  I suspect he may be ready to transition to oral tomorrow.  Current blood pressure regimen is clonidine  patch and Nebivolol .  We will await his echocardiogram before devising a GDMT heart failure and hypertension  regimen.  # Atrial fibrillation:  Rate is controlled on nebivolol  agree with prior recommendation to pursue TEE/DCCV given his heart failure presentation and new A-fib diagnosis.  Discussed with the patient and he is okay with this.  Attempted to call his daughter-in-law with no answer on the phone.  Continue Eliquis .  N.p.o. for TEE/DCCV tomorrow.  Informed Consent   Shared Decision Making/Informed Consent   The risks [stroke, cardiac arrhythmias rarely resulting in the need for a temporary or permanent pacemaker, skin irritation or burns, esophageal damage, perforation (1:10,000 risk), bleeding, pharyngeal hematoma as well as other potential complications associated with conscious sedation including aspiration, arrhythmia, respiratory failure and death], benefits (treatment guidance, restoration of normal sinus rhythm, diagnostic support) and alternatives of a transesophageal echocardiogram guided cardioversion were discussed in detail with Mr. Raska and he is willing to proceed.     # CKD 3a:  Renal function stable with diuresis.   For questions or updates, please contact [facility] Please consult [email or web address] for contact info under     Signed, Annabella Scarce, MD  01/21/2024, 10:20 AM

## 2024-01-21 NOTE — H&P (View-Only) (Signed)
 Rounding Note   Patient Name: Keith [name] Date of Encounter: 01/21/2024  [facility] Cardiologist: Gordy Bergamo, MD   Subjective Still short of breath.    Scheduled Meds:  apixaban   2.5 mg Oral BID   atorvastatin   10 mg Oral Daily   carbidopa -levodopa   1 tablet Oral TID AC   cloNIDine   0.1 mg Transdermal Weekly   finasteride   5 mg Oral Daily   furosemide   40 mg Intravenous BID   nebivolol   20 mg Oral Daily   pantoprazole   40 mg Oral Daily   sodium chloride  flush  3 mL Intravenous Q12H   tamsulosin   0.4 mg Oral Daily   Continuous Infusions:  PRN Meds: acetaminophen  **OR** acetaminophen , polyethylene glycol   Vital Signs  Vitals:   01/20/24 2349 01/21/24 0334 01/21/24 0354 01/21/24 0715  BP: (!) 133/97 (!) 143/94  (!) 153/97  Pulse: 85 90  86  Resp: 20 19  20   Temp: 97.7 F (36.5 C) 97.9 F (36.6 C)  97.6 F (36.4 C)  TempSrc: Oral Oral  Oral  SpO2: 97% 95%  95%  Weight:   85.5 kg   Height:        Intake/Output Summary (Last 24 hours) at 01/21/2024 1020 Last data filed at 01/21/2024 0930 Gross per 24 hour  Intake 358 ml  Output 2920 ml  Net -2562 ml      01/21/2024    3:54 AM 01/20/2024    6:44 PM 01/20/2024   11:39 AM  Last 3 Weights  Weight (lbs) 188 lb 7.9 oz 195 lb 5.2 oz 197 lb  Weight (kg) 85.5 kg 88.6 kg 89.359 kg      Telemetry Atrial fibrillation. PVCs. Rate <100 bpm. - Personally Reviewed  ECG  01/20/24:  Atrial fibrillation.  Rate 108 bpm.  PVCs.  Cannot rule out prior anterior MI.  - Personally Reviewed  Physical Exam  VS:  BP (!) 153/97 (BP Location: Right Arm)   Pulse 86   Temp 97.6 F (36.4 C) (Oral)   Resp 20   Ht 5' 6 (1.676 m)   Wt 85.5 kg   SpO2 95%   BMI 30.42 kg/m  , BMI Body mass index is 30.42 kg/m. GENERAL:  Well appearing HEENT: Pupils equal round and reactive, fundi not visualized, oral mucosa unremarkable NECK:  No jugular venous distention, waveform within normal limits, carotid upstroke brisk and symmetric,  no bruits, no thyromegaly LUNGS:  Diminished at bases  HEART:  Irregularly irregular.  PMI not displaced or sustained,S1 and S2 within normal limits, no S3, no S4, no clicks, no rubs, no murmurs ABD:  Flat, positive bowel sounds normal in frequency in pitch, no bruits, no rebound, no guarding, no midline pulsatile mass, no hepatomegaly, no splenomegaly EXT:  2 plus pulses throughout, 1+ LE edema, no cyanosis no clubbing SKIN:  No rashes no nodules NEURO:  Cranial nerves II through XII grossly intact, motor grossly intact throughout PSYCH:  Cognitively intact, oriented to person place and time   Labs High Sensitivity Troponin:   Recent Labs  Lab 01/20/24 1149 01/20/24 1412  TROPONINIHS 18* 19*     Chemistry Recent Labs  Lab 01/20/24 1149 01/21/24 0320  NA 140 141  K 4.0 3.6  CL 106 105  CO2 25 24  GLUCOSE 110* 94  BUN 24* 22  CREATININE 1.67* 1.70*  CALCIUM  9.3 8.8*  MG 1.9 1.9  PROT 6.4* 4.8*  ALBUMIN 3.5 3.3*  AST 20 10*  ALT  7 <5  ALKPHOS 51 47  BILITOT 1.4* 1.3*  GFRNONAA 40* 39*  ANIONGAP 9 12    Lipids No results for input(s): CHOL, TRIG, HDL, LABVLDL, LDLCALC, CHOLHDL in the last 168 hours.  Hematology Recent Labs  Lab 01/20/24 1149 01/21/24 0320  WBC 10.5 11.2*  RBC 4.51 4.48  HGB 13.3 13.1  HCT 42.0 40.6  MCV 93.1 90.6  MCH 29.5 29.2  MCHC 31.7 32.3  RDW 15.7* 15.4  PLT 159 159   Thyroid  No results for input(s): TSH, FREET4 in the last 168 hours.  BNP Recent Labs  Lab 01/20/24 1149  BNP 2,227.1*    DDimer No results for input(s): DDIMER in the last 168 hours.   Radiology  CT CHEST ABDOMEN PELVIS WO CONTRAST Result Date: 01/20/2024 CLINICAL DATA:  The unintended weight loss EXAM: CT CHEST, ABDOMEN AND PELVIS WITHOUT CONTRAST TECHNIQUE: Multidetector CT imaging of the chest, abdomen and pelvis was performed following the standard protocol without IV contrast. RADIATION DOSE REDUCTION: This exam was performed according to the  departmental dose-optimization program which includes automated exposure control, adjustment of the mA and/or kV according to patient size and/or use of iterative reconstruction technique. COMPARISON:  Same day chest radiograph and prior studies FINDINGS: CT CHEST FINDINGS Cardiovascular: Normal caliber aorta. Scattered aortic calcifications. No pericardial effusion. Mediastinum/Nodes: No lymphadenopathy. Lungs/Pleura: Small to moderate bilateral pleural effusions with associated bibasilar atelectasis/consolidation. Interlobular septal thickening, suggestive of mild pulmonary edema. Tiny 0.5 cm right middle lobe pulmonary nodule, unchanged from prior. Musculoskeletal: No acute osseous findings. CT ABDOMEN PELVIS FINDINGS Hepatobiliary: Unremarkable. Pancreas: Unremarkable. Spleen: Unremarkable. Adrenals/Urinary Tract: Adrenal glands are unremarkable. Symmetric nephrograms. No hydronephrosis or nephrolithiasis. 8.2 x 5.4 cm left renal cyst. Stomach/Bowel: No evidence of bowel obstruction or inflammation. The visualized appendix is unremarkable. Vascular/Lymphatic: Aortic atherosclerosis. No enlarged abdominal or pelvic lymph nodes. Reproductive: Enlarged prostate, measuring approximately 5.7 by 5.4 cm. Scattered prostatic calcifications, similar to prior. Other: No free air free fluid. Fat containing bilateral inguinal and umbilical hernias. Musculoskeletal: No acute osseous findings. IMPRESSION: 1. Small to moderate bilateral pleural effusions with likely mild interstitial pulmonary edema. 2. Stable additional chronic and incidental findings as noted above. Electronically Signed   By: Michaeline Blanch M.D.   On: 01/20/2024 14:41   DG Shoulder Left Result Date: 01/20/2024 EXAM: 1 VIEW XRAY OF THE LEFT SHOULDER 01/20/2024 01:23:00 PM COMPARISON: None available. CLINICAL HISTORY: L shoulder pain. FINDINGS: BONES AND JOINTS: No fracture. Moderate osteoarthritis of the acromioclavicular joint. Superior subluxation of  humeral head and remodeling of the undersurface of the acromion . SOFT TISSUES: No abnormal calcifications. Visualized lung is unremarkable. IMPRESSION: 1. No fracture. 2. Superior subluxation of the humeral head and remodeling of the undersurface of the acromion, likely reflecting chronic rotator cuff pathology. Electronically signed by: Selinda Blue MD 01/20/2024 02:14 PM EDT RP Workstation: [id number]   DG Chest Portable 1 View Result Date: 01/20/2024 EXAM: 1 VIEW XRAY OF THE CHEST 01/20/2024 12:01:00 PM COMPARISON: 05/25/2023 CLINICAL HISTORY: SOB, atrial fibrillation FINDINGS: LUNGS AND PLEURA: Small bilateral pleural effusions. Mild pulmonary edema. Bibasilar atelectasis. HEART AND MEDIASTINUM: Borderline mild cardiomegaly. BONES AND SOFT TISSUES: No acute osseous abnormality. IMPRESSION: 1. Mild CHF with small bilateral pleural effusions and bibasilar atelectasis. Electronically signed by: Selinda Blue MD 01/20/2024 12:05 PM EDT RP Workstation: [id number]    Cardiac Studies Echo pending  Patient Profile   83 y.o. male with hypertension, HFimpEF, CKD 3, DVT, atrial fibrillation,   Assessment & Plan   # HFimpEF:  #  Hypertension:  LVEF was previously reduced to 35-40%.  Subsequently improved to 50-55% on his most recent echocardiogram 06/2021.  He is now here with heart failure symptoms and atrial fibrillation.  Concerned that his EF may be reduced again.  Echo this admission is pending.  Yesterday he was a -2.5 L.  However intake is not recorded.  Will update the orders accordingly.  His weight is improving down from 89 kg on admission to 85.5 kg today.  Continue blood pressure has been quite labile ranging from the 90s systolic to most recently 153/97.  With Lasix  40 mg IV twice daily.  I suspect he may be ready to transition to oral tomorrow.  Current blood pressure regimen is clonidine  patch and Nebivolol .  We will await his echocardiogram before devising a GDMT heart failure and hypertension  regimen.  # Atrial fibrillation:  Rate is controlled on nebivolol  agree with prior recommendation to pursue TEE/DCCV given his heart failure presentation and new A-fib diagnosis.  Discussed with the patient and he is okay with this.  Attempted to call his daughter-in-law with no answer on the phone.  Continue Eliquis .  N.p.o. for TEE/DCCV tomorrow.  Informed Consent   Shared Decision Making/Informed Consent   The risks [stroke, cardiac arrhythmias rarely resulting in the need for a temporary or permanent pacemaker, skin irritation or burns, esophageal damage, perforation (1:10,000 risk), bleeding, pharyngeal hematoma as well as other potential complications associated with conscious sedation including aspiration, arrhythmia, respiratory failure and death], benefits (treatment guidance, restoration of normal sinus rhythm, diagnostic support) and alternatives of a transesophageal echocardiogram guided cardioversion were discussed in detail with Mr. Raska and he is willing to proceed.     # CKD 3a:  Renal function stable with diuresis.   For questions or updates, please contact [facility] Please consult [email or web address] for contact info under     Signed, Annabella Scarce, MD  01/21/2024, 10:20 AM

## 2024-01-21 NOTE — Progress Notes (Signed)
 Echocardiogram 2D Echocardiogram has been performed.  Keith [name] Gauri Galvao RDCS 01/21/2024, 12:20 PM

## 2024-01-21 NOTE — Progress Notes (Addendum)
 PROGRESS NOTE    Keith [name]  FMW:[id number] DOB: 1940/11/17 DOA: 01/20/2024 PCP: Gerome Brunet, DO  E/M with chronic diastolic CHF, CKD 3a, DVT, was diagnosed with A-fib over a month ago, prescribed Eliquis  and recommended to have outpatient cardioversion, family canceled this and wanted a second opinion.  Presented to the ED 9/1 with weakness, shortness of breath, some left-sided abdominal pain.  History of poor compliance with medications.  In the ED A-fib heart rate 60s to 100s, blood pressure stable, creatinine 1.6, BNP 2227, chest x-ray with mild CHF and bilateral pleural effusions,    Subjective: -Breathing better, has some mild left-sided upper abdomen/lower chest pain  Assessment and Plan:  Acute on chronic diastolic CHF - Limited compliance -Last echo 2/23 with EF 50-55%, normal RV -Likely CHF triggered by new A-fib -Continue IV Lasix  today, Nebivolol  -Cards following, consider SGLT2i -Increase activity, PT OT eval   Persistent atrial fibrillation -Recent diagnosis, at the time of cards eval 7/31  - Poor compliance with Eliquis  - Continue Nebivolol , Eliquis , cards following, plan for TEE/DCCV once volume status has improved  Abdominal discomfort -Exam unremarkable, could be related to above, also check KUB   Hypertension - On home regimen of clonidine  patch, Nebivolol    Hyperlipidemia - Continue atorvastatin    GERD - Continue PPI   CKD 3 -Stable, monitor with diuresis   CAD - Continue home Eliquis , Nebivolol , atorvastatin    Parkinson's disease -Recently diagnosed. - Continue home carbidopa /levodopa    Obesity - Noted   History of DVT in 2024 - Noted, currently on Eliquis  for A-fib   BPH - Continue tamsulosin , finasteride    Gout - Takes allopurinol as needed   DVT prophylaxis:      Eliquis  Code Status:              Full Family Communication: No family at bedside Disposition Plan: Home pending above workup  Consultants:    Procedures:    Antimicrobials:    Objective: Vitals:   01/20/24 2349 01/21/24 0334 01/21/24 0354 01/21/24 0715  BP: (!) 133/97 (!) 143/94  (!) 153/97  Pulse: 85 90  86  Resp: 20 19  20   Temp: 97.7 F (36.5 C) 97.9 F (36.6 C)  97.6 F (36.4 C)  TempSrc: Oral Oral  Oral  SpO2: 97% 95%  95%  Weight:   85.5 kg   Height:        Intake/Output Summary (Last 24 hours) at 01/21/2024 0953 Last data filed at 01/21/2024 0930 Gross per 24 hour  Intake 358 ml  Output 2920 ml  Net -2562 ml   Filed Weights   01/20/24 1139 01/20/24 1844 01/21/24 0354  Weight: 89.4 kg 88.6 kg 85.5 kg    Examination:  General exam: Appears calm and comfortable  HEENT: + JVD Respiratory system: Clear to auscultation Cardiovascular system: S1 & S2 heard, irregular Abd: nondistended, soft and nontender.Normal bowel sounds heard. Central nervous system: Alert and oriented. No focal neurological deficits. Extremities: 1 plus edema Skin: No rashes Psychiatry: Flat affect    Data Reviewed:   CBC: Recent Labs  Lab 01/20/24 1149 01/21/24 0320  WBC 10.5 11.2*  NEUTROABS 8.2*  --   HGB 13.3 13.1  HCT 42.0 40.6  MCV 93.1 90.6  PLT 159 159   Basic Metabolic Panel: Recent Labs  Lab 01/20/24 1149 01/21/24 0320  NA 140 141  K 4.0 3.6  CL 106 105  CO2 25 24  GLUCOSE 110* 94  BUN 24* 22  CREATININE 1.67* 1.70*  CALCIUM  9.3 8.8*  MG 1.9 1.9   GFR: Estimated Creatinine Clearance: 33.2 mL/min (A) (by C-G formula based on SCr of 1.7 mg/dL (H)). Liver Function Tests: Recent Labs  Lab 01/20/24 1149 01/21/24 0320  AST 20 10*  ALT 7 <5  ALKPHOS 51 47  BILITOT 1.4* 1.3*  PROT 6.4* 4.8*  ALBUMIN 3.5 3.3*   Recent Labs  Lab 01/20/24 1149  LIPASE 28   No results for input(s): AMMONIA in the last 168 hours. Coagulation Profile: No results for input(s): INR, PROTIME in the last 168 hours. Cardiac Enzymes: No results for input(s): CKTOTAL, CKMB, CKMBINDEX, TROPONINI in the last 168  hours. BNP (last 3 results) Recent Labs    08/01/23 1254  PROBNP 4,712*   HbA1C: No results for input(s): HGBA1C in the last 72 hours. CBG: Recent Labs  Lab 01/20/24 1143  GLUCAP 109*   Lipid Profile: No results for input(s): CHOL, HDL, LDLCALC, TRIG, CHOLHDL, LDLDIRECT in the last 72 hours. Thyroid  Function Tests: No results for input(s): TSH, T4TOTAL, FREET4, T3FREE, THYROIDAB in the last 72 hours. Anemia Panel: No results for input(s): VITAMINB12, FOLATE, FERRITIN, TIBC, IRON, RETICCTPCT in the last 72 hours. Urine analysis:    Component Value Date/Time   COLORURINE YELLOW 01/20/2024 1141   APPEARANCEUR CLEAR 01/20/2024 1141   LABSPEC 1.015 01/20/2024 1141   PHURINE 5.0 01/20/2024 1141   GLUCOSEU NEGATIVE 01/20/2024 1141   HGBUR NEGATIVE 01/20/2024 1141   BILIRUBINUR NEGATIVE 01/20/2024 1141   KETONESUR NEGATIVE 01/20/2024 1141   PROTEINUR NEGATIVE 01/20/2024 1141   NITRITE NEGATIVE 01/20/2024 1141   LEUKOCYTESUR NEGATIVE 01/20/2024 1141   Sepsis Labs: @LABRCNTIP (procalcitonin:4,lacticidven:4)  )No results found for this or any previous visit (from the past 240 hours).   Radiology Studies: CT CHEST ABDOMEN PELVIS WO CONTRAST Result Date: 01/20/2024 CLINICAL DATA:  The unintended weight loss EXAM: CT CHEST, ABDOMEN AND PELVIS WITHOUT CONTRAST TECHNIQUE: Multidetector CT imaging of the chest, abdomen and pelvis was performed following the standard protocol without IV contrast. RADIATION DOSE REDUCTION: This exam was performed according to the departmental dose-optimization program which includes automated exposure control, adjustment of the mA and/or kV according to patient size and/or use of iterative reconstruction technique. COMPARISON:  Same day chest radiograph and prior studies FINDINGS: CT CHEST FINDINGS Cardiovascular: Normal caliber aorta. Scattered aortic calcifications. No pericardial effusion. Mediastinum/Nodes: No  lymphadenopathy. Lungs/Pleura: Small to moderate bilateral pleural effusions with associated bibasilar atelectasis/consolidation. Interlobular septal thickening, suggestive of mild pulmonary edema. Tiny 0.5 cm right middle lobe pulmonary nodule, unchanged from prior. Musculoskeletal: No acute osseous findings. CT ABDOMEN PELVIS FINDINGS Hepatobiliary: Unremarkable. Pancreas: Unremarkable. Spleen: Unremarkable. Adrenals/Urinary Tract: Adrenal glands are unremarkable. Symmetric nephrograms. No hydronephrosis or nephrolithiasis. 8.2 x 5.4 cm left renal cyst. Stomach/Bowel: No evidence of bowel obstruction or inflammation. The visualized appendix is unremarkable. Vascular/Lymphatic: Aortic atherosclerosis. No enlarged abdominal or pelvic lymph nodes. Reproductive: Enlarged prostate, measuring approximately 5.7 by 5.4 cm. Scattered prostatic calcifications, similar to prior. Other: No free air free fluid. Fat containing bilateral inguinal and umbilical hernias. Musculoskeletal: No acute osseous findings. IMPRESSION: 1. Small to moderate bilateral pleural effusions with likely mild interstitial pulmonary edema. 2. Stable additional chronic and incidental findings as noted above. Electronically Signed   By: Michaeline Blanch M.D.   On: 01/20/2024 14:41   DG Shoulder Left Result Date: 01/20/2024 EXAM: 1 VIEW XRAY OF THE LEFT SHOULDER 01/20/2024 01:23:00 PM COMPARISON: None available. CLINICAL HISTORY: L shoulder pain. FINDINGS: BONES AND JOINTS: No fracture. Moderate osteoarthritis of the  acromioclavicular joint. Superior subluxation of humeral head and remodeling of the undersurface of the acromion . SOFT TISSUES: No abnormal calcifications. Visualized lung is unremarkable. IMPRESSION: 1. No fracture. 2. Superior subluxation of the humeral head and remodeling of the undersurface of the acromion, likely reflecting chronic rotator cuff pathology. Electronically signed by: Selinda Blue MD 01/20/2024 02:14 PM EDT RP Workstation:  [id number]   DG Chest Portable 1 View Result Date: 01/20/2024 EXAM: 1 VIEW XRAY OF THE CHEST 01/20/2024 12:01:00 PM COMPARISON: 05/25/2023 CLINICAL HISTORY: SOB, atrial fibrillation FINDINGS: LUNGS AND PLEURA: Small bilateral pleural effusions. Mild pulmonary edema. Bibasilar atelectasis. HEART AND MEDIASTINUM: Borderline mild cardiomegaly. BONES AND SOFT TISSUES: No acute osseous abnormality. IMPRESSION: 1. Mild CHF with small bilateral pleural effusions and bibasilar atelectasis. Electronically signed by: Jason Poff MD 01/20/2024 12:05 PM EDT RP Workstation: [id number]     Scheduled Meds:  apixaban   2.5 mg Oral BID   atorvastatin   10 mg Oral Daily   carbidopa -levodopa   1 tablet Oral TID AC   cloNIDine   0.1 mg Transdermal Weekly   finasteride   5 mg Oral Daily   furosemide   40 mg Intravenous BID   nebivolol   20 mg Oral Daily   pantoprazole   40 mg Oral Daily   sodium chloride  flush  3 mL Intravenous Q12H   tamsulosin   0.4 mg Oral Daily   Continuous Infusions:   LOS: 1 day    Time spent:    Sigurd Pac, MD [facility]   01/21/2024, 9:53 AM

## 2024-01-21 NOTE — Plan of Care (Signed)

## 2024-01-22 ENCOUNTER — Encounter (HOSPITAL_COMMUNITY)
Admission: EM | Disposition: A | Discharge status: Home / Self Care | Payer: Self-pay | Source: Home / Self Care | Attending: Internal Medicine

## 2024-01-22 ENCOUNTER — Inpatient Hospital Stay (HOSPITAL_COMMUNITY)

## 2024-01-22 ENCOUNTER — Encounter (HOSPITAL_COMMUNITY): Payer: Self-pay | Admitting: Cardiology

## 2024-01-22 DIAGNOSIS — I5023 Acute on chronic systolic (congestive) heart failure: Secondary | ICD-10-CM

## 2024-01-22 DIAGNOSIS — I4891 Unspecified atrial fibrillation: Secondary | ICD-10-CM | POA: Diagnosis not present

## 2024-01-22 DIAGNOSIS — N183 Chronic kidney disease, stage 3 unspecified: Secondary | ICD-10-CM | POA: Diagnosis not present

## 2024-01-22 DIAGNOSIS — I5033 Acute on chronic diastolic (congestive) heart failure: Secondary | ICD-10-CM | POA: Diagnosis not present

## 2024-01-22 DIAGNOSIS — G20C Parkinsonism, unspecified: Secondary | ICD-10-CM

## 2024-01-22 DIAGNOSIS — I130 Hypertensive heart and chronic kidney disease with heart failure and stage 1 through stage 4 chronic kidney disease, or unspecified chronic kidney disease: Secondary | ICD-10-CM | POA: Diagnosis not present

## 2024-01-22 DIAGNOSIS — N1831 Chronic kidney disease, stage 3a: Secondary | ICD-10-CM | POA: Diagnosis not present

## 2024-01-22 DIAGNOSIS — I361 Nonrheumatic tricuspid (valve) insufficiency: Secondary | ICD-10-CM

## 2024-01-22 DIAGNOSIS — I480 Paroxysmal atrial fibrillation: Secondary | ICD-10-CM | POA: Diagnosis not present

## 2024-01-22 DIAGNOSIS — I340 Nonrheumatic mitral (valve) insufficiency: Secondary | ICD-10-CM

## 2024-01-22 DIAGNOSIS — N1832 Chronic kidney disease, stage 3b: Secondary | ICD-10-CM | POA: Diagnosis not present

## 2024-01-22 DIAGNOSIS — I10 Essential (primary) hypertension: Secondary | ICD-10-CM | POA: Diagnosis not present

## 2024-01-22 DIAGNOSIS — I509 Heart failure, unspecified: Secondary | ICD-10-CM | POA: Diagnosis not present

## 2024-01-22 HISTORY — PX: TRANSESOPHAGEAL ECHOCARDIOGRAM (CATH LAB): EP1270

## 2024-01-22 HISTORY — PX: CARDIOVERSION: EP1203

## 2024-01-22 LAB — BASIC METABOLIC PANEL WITH GFR
Anion gap: 13 (ref 5–15)
BUN: 33 mg/dL — ABNORMAL HIGH (ref 8–23)
CO2: 25 mmol/L (ref 22–32)
Calcium: 8.5 mg/dL — ABNORMAL LOW (ref 8.9–10.3)
Chloride: 103 mmol/L (ref 98–111)
Creatinine, Ser: 2.03 mg/dL — ABNORMAL HIGH (ref 0.61–1.24)
GFR, Estimated: 32 mL/min — ABNORMAL LOW (ref >60)
Glucose, Bld: 105 mg/dL — ABNORMAL HIGH (ref 70–99)
Potassium: 3.5 mmol/L (ref 3.5–5.1)
Sodium: 141 mmol/L (ref 135–145)

## 2024-01-22 LAB — ECHO TEE

## 2024-01-22 LAB — CBC
HCT: 43 % (ref 39.0–52.0)
Hemoglobin: 13.9 g/dL (ref 13.0–17.0)
MCH: 29.1 pg (ref 26.0–34.0)
MCHC: 32.3 g/dL (ref 30.0–36.0)
MCV: 90.1 fL (ref 80.0–100.0)
Platelets: 163 K/uL (ref 150–400)
RBC: 4.77 MIL/uL (ref 4.22–5.81)
RDW: 15.4 % (ref 11.5–15.5)
WBC: 11.5 K/uL — ABNORMAL HIGH (ref 4.0–10.5)
nRBC: 0 % (ref 0.0–0.2)

## 2024-01-22 LAB — GLUCOSE, CAPILLARY: Glucose-Capillary: 98 mg/dL (ref 70–99)

## 2024-01-22 SURGERY — TRANSESOPHAGEAL ECHOCARDIOGRAM (TEE) (CATHLAB)
Anesthesia: General

## 2024-01-22 MED ORDER — SODIUM CHLORIDE 0.9 % IV SOLN: INTRAVENOUS | Status: DC

## 2024-01-22 MED ORDER — PROPOFOL 10 MG/ML IV BOLUS
INTRAVENOUS | Status: DC | PRN
  Administered 2024-01-22 (×3): 20 mg via INTRAVENOUS
  Administered 2024-01-22: 25 ug/kg/min via INTRAVENOUS

## 2024-01-22 MED ORDER — PHENYLEPHRINE HCL-NACL 20-0.9 MG/250ML-% IV SOLN
INTRAVENOUS | Status: DC | PRN
  Administered 2024-01-22 (×2): 80 ug via INTRAVENOUS

## 2024-01-22 MED ORDER — LIDOCAINE 2% (20 MG/ML) 5 ML SYRINGE
INTRAMUSCULAR | Status: DC | PRN
  Administered 2024-01-22: 60 mg via INTRAVENOUS

## 2024-01-22 MED ORDER — POTASSIUM CHLORIDE CRYS ER 20 MEQ PO TBCR: 40.0000 meq | EXTENDED_RELEASE_TABLET | Freq: Once | ORAL | Status: DC

## 2024-01-22 SURGICAL SUPPLY — 1 items: PAD DEFIB RADIO PHYSIO CONN (PAD) ×1 IMPLANT

## 2024-01-22 NOTE — Hospital Course (Addendum)
 [name] was admitted to the hospital with the working diagnosis of heart failure exacerbation.   83 yo male with the past medical history of chronic diastolic CHF, CKD 3a, DVT, was diagnosed with A-fib over a month ago, prescribed Eliquis  and recommended to have outpatient cardioversion, family canceled this and wanted a second opinion.  Presented to the ED 9/1 with weakness, shortness of breath, some left-sided abdominal pain.  History of poor compliance with medications.  In the ED A-fib heart rate 60s to 100s, blood pressure stable, creatinine 1.6, BNP 2227, chest x-ray with mild CHF and bilateral pleural effusions,   09/03 successful direct current cardioversion.

## 2024-01-22 NOTE — Assessment & Plan Note (Addendum)
 Continue blood pressure monitoring On nebivolol  and clonidine  (patch).

## 2024-01-22 NOTE — Assessment & Plan Note (Signed)
No signs of urinary retention.  ?Continue with tamsulosin and finesteride.  ? ?

## 2024-01-22 NOTE — Anesthesia Postprocedure Evaluation (Signed)
 Anesthesia Post Note  Patient: Keith [name]  Procedure(s) Performed: TRANSESOPHAGEAL ECHOCARDIOGRAM CARDIOVERSION     Patient location during evaluation: Cath Lab Anesthesia Type: General Level of consciousness: awake and alert Pain management: pain level controlled Vital Signs Assessment: post-procedure vital signs reviewed and stable Respiratory status: spontaneous breathing, nonlabored ventilation, respiratory function stable and patient connected to nasal cannula oxygen Cardiovascular status: blood pressure returned to baseline and stable Postop Assessment: no apparent nausea or vomiting Anesthetic complications: no   No notable events documented.  Last Vitals:  Vitals:   01/22/24 0925 01/22/24 1143  BP: (!) 126/102 111/61  Pulse: (!) 209 (!) 51  Resp: (!) 30 17  Temp: 36.8 C   SpO2: 95% 95%    Last Pain:  Vitals:   01/22/24 0934  TempSrc:   PainSc: 0-No pain                 Garnette FORBES Skillern

## 2024-01-22 NOTE — Evaluation (Signed)
 Physical Therapy Evaluation Patient Details Name: Keith [name] MRN: [id number] DOB: [birth year] Today's Date: 01/22/2024  History of Present Illness  Pt is a 83 y.o. male admitted 9/1 for SOB and abdominal pain. Pt found to be in afib, and chest x-ray showed small bilateral effusions and bibasilar atelectasis. Admitted for management of CHF exacerbation and afib. S/p cardioversion 9/3. PMH: HTN, CHF, CKD, DVT, GERD, afib (new onset 12/19/23, had originally scheduled cardioversion but family canceled), and Parkinson's.   Clinical Impression  Pt in bed upon arrival of PT, agreeable to evaluation at this time. Prior to admission the pt was ambulating short distances in the home with RW and assist from grandson, pt needed assist for sit-stand transfers and stairs in the home. The pt presents today with general weakness in LE, poor endurance, power, and dynamic stability that will benefit from continued skilled PT. He required minA to complete sit-stand transfers and up to modA to maintain upright with 35 ft of gait due to knee buckling x2. Pt given HEP for LE strengthening, will continue to benefit from skilled PT acutely and HHPT to further progress strength and endurance. Recommend WC due to high fall risk and limited endurance to manage getting into his house from the car when he returns home.   HR 58bpm-98bpm with monitor alarming afib rhythm SpO2 and BP stable     If plan is discharge home, recommend the following: A little help with walking and/or transfers;A little help with bathing/dressing/bathroom;Assistance with cooking/housework;Assist for transportation;Help with stairs or ramp for entrance;Direct supervision/assist for medications management   Can travel by private vehicle        Equipment Recommendations Wheelchair (measurements PT);Wheelchair cushion (measurements PT);BSC/3in1  Recommendations for Other Services       Functional Status Assessment Patient has had a recent decline  in their functional status and demonstrates the ability to make significant improvements in function in a reasonable and predictable amount of time.     Precautions / Restrictions Precautions Precautions: Fall Recall of Precautions/Restrictions: Intact Restrictions Weight Bearing Restrictions Per Provider Order: No      Mobility  Bed Mobility Overal bed mobility: Needs Assistance             General bed mobility comments: pt OOB in recliner at start and end of session    Transfers Overall transfer level: Needs assistance Equipment used: Rolling walker (2 wheels) Transfers: Sit to/from Stand Sit to Stand: Min assist, Contact guard assist           General transfer comment: minA initially, progressed to CGA with reps. dependent on BUE support    Ambulation/Gait Ambulation/Gait assistance: Min assist, Mod assist Gait Distance (Feet): 35 Feet Assistive device: Rolling walker (2 wheels) Gait Pattern/deviations: Knees buckling, Step-through pattern, Decreased stride length Gait velocity: decreased     General Gait Details: slowed gait with progressive knee flexion and x2 buckling. pt reports fatigue and weakness in LE    Balance Overall balance assessment: Needs assistance Sitting-balance support: No upper extremity supported, Feet supported Sitting balance-Leahy Scale: Fair     Standing balance support: Bilateral upper extremity supported, During functional activity, Reliant on assistive device for balance Standing balance-Leahy Scale: Poor Standing balance comment: Reliant on RW                             Pertinent Vitals/Pain Pain Assessment Pain Assessment: Faces Faces Pain Scale: Hurts little more Pain Location: L shoulder Pain  Descriptors / Indicators: Discomfort, Grimacing Pain Intervention(s): Limited activity within patient's tolerance, Monitored during session, Repositioned    Home Living Family/patient expects to be discharged to::  Private residence Living Arrangements: Spouse/significant other;Other relatives (Grandson) Available Help at Discharge: Family;Available 24 hours/day Type of Home: House Home Access: Stairs to enter Entrance Stairs-Rails: None Entrance Stairs-Number of Steps: 1   Home Layout: One level Home Equipment: Agricultural consultant (2 wheels);Shower seat;Grab bars - tub/shower Additional Comments: Grandson recently moved in d/t fall hx of pt and wife. Works from home available 24/7    Prior Function Prior Level of Function : Needs assist             Mobility Comments: Pt using RW. Reports hx of 2 falls within last 6 months. grandson has been living with him, assisting with sit-stand transfers and stairs. no exercise. largely sedentary on couch all day with LE down ADLs Comments: Assist for buttons, grandson completing IADLs     Extremity/Trunk Assessment   Upper Extremity Assessment Upper Extremity Assessment: Defer to OT evaluation LUE Deficits / Details: DG showed Superior subluxation of the humeral head. ~80 degrees of shoulder flex and abd AROM, internal rotation WFL LUE Sensation: WNL LUE Coordination: decreased gross motor;decreased fine motor    Lower Extremity Assessment Lower Extremity Assessment: Generalized weakness;LLE deficits/detail;RLE deficits/detail RLE Deficits / Details: limited knee flexion from past knee surgery, grossly 4+/5 to MMT but poor endurance and power (unable to stand without UE support) RLE Sensation: WNL RLE Coordination: WNL LLE Deficits / Details: grossly 4/5 to MMT with buckling with gait, poor endurance and power (unable to stand without UE support) LLE Sensation: WNL LLE Coordination: WNL    Cervical / Trunk Assessment Cervical / Trunk Assessment: Normal  Communication   Communication Communication: No apparent difficulties    Cognition Arousal: Alert Behavior During Therapy: WFL for tasks assessed/performed   PT - Cognitive impairments:  Memory, Attention, Awareness, Problem solving, Safety/Judgement                       PT - Cognition Comments: pt able to answer questions, but often going off on tangents off topic of conversation, poor awareness of deficits and limitations. giving different information to PT than OT Following commands: Intact       Cueing Cueing Techniques: Verbal cues     General Comments General comments (skin integrity, edema, etc.): HR from 58-98 in session, monitor alarming afib rhythm    Exercises General Exercises - Lower Extremity Long Arc Quad: AROM, Both, 10 reps, Seated Heel Raises: AROM, Both, 15 reps, Seated Other Exercises Other Exercises: sit-stand x5 in 45 sec   Assessment/Plan    PT Assessment Patient needs continued PT services  PT Problem List Decreased strength;Decreased activity tolerance;Decreased balance;Decreased mobility;Decreased safety awareness       PT Treatment Interventions DME instruction;Gait training;Stair training;Functional mobility training;Therapeutic activities;Therapeutic exercise;Balance training;Neuromuscular re-education;Patient/family education    PT Goals (Current goals can be found in the Care Plan section)  Acute Rehab PT Goals Patient Stated Goal: to get stronger PT Goal Formulation: With patient Time For Goal Achievement: 02/05/24 Potential to Achieve Goals: Good    Frequency Min 2X/week        AM-PAC PT 6 Clicks Mobility  Outcome Measure Help needed turning from your back to your side while in a flat bed without using bedrails?: A Little Help needed moving from lying on your back to sitting on the side of a flat bed without  using bedrails?: A Little Help needed moving to and from a bed to a chair (including a wheelchair)?: A Little Help needed standing up from a chair using your arms (e.g., wheelchair or bedside chair)?: A Little Help needed to walk in hospital room?: A Lot Help needed climbing 3-5 steps with a railing? :  Total 6 Click Score: 15    End of Session Equipment Utilized During Treatment: Gait belt Activity Tolerance: Patient limited by fatigue Patient left: in chair;with call bell/phone within reach;with chair alarm set;with family/visitor present Nurse Communication: Mobility status PT Visit Diagnosis: Unsteadiness on feet (R26.81);Muscle weakness (generalized) (M62.81)    Time: 8662-8584 PT Time Calculation (min) (ACUTE ONLY): 38 min   Charges:   PT Evaluation $PT Eval Moderate Complexity: 1 Mod PT Treatments $Gait Training: 8-22 mins $Therapeutic Exercise: 8-22 mins PT General Charges $$ ACUTE PT VISIT: 1 Visit         Izetta Call, PT, DPT   Acute Rehabilitation Department Office [phone] Secure Chat Communication Preferred  Izetta JULIANNA Call 01/22/2024, 3:46 PM

## 2024-01-22 NOTE — Assessment & Plan Note (Signed)
>>  ASSESSMENT AND PLAN FOR ACUTE ON CHRONIC SYSTOLIC CHF (CONGESTIVE HEART FAILURE) (HCC) WRITTEN ON 01/24/2024 12:12 PM BY ARRIEN, MAURICIO DANIEL, MD  Echocardiogram with reduced LV systolic function with EF 30 to 35%, global hypokinesis, RV systolic function preserved, with mild increased wall thickness, LA with moderate dilatation and RA with severe dilatation, small pericardial effusion, moderate mitral regurgitation   Patient was placed on IV furosemide  for diuresis, negative fluid balance was achieved, - 6,228 ml, with significant improvement in his symptoms.   Plan to continue medical therapy with nebivolol  20 mg daily and SGLT 2 inh.  Irbersartan 75 mg daily for RAAS inhibition. Possible addition of mineralocorticoid receptor blocker as outpatient if renal function more stable.  Resume loop diuretic with bumetanide  0,5 mg po daily.

## 2024-01-22 NOTE — Progress Notes (Signed)
 Rounding Note   Patient Name: Keith [name] Date of Encounter: 01/22/2024  [facility] Cardiologist: Gordy Bergamo, MD   Subjective Still short of breath.  Tired and didn't sleep well.   Scheduled Meds:  apixaban   2.5 mg Oral BID   atorvastatin   10 mg Oral Daily   carbidopa -levodopa   1 tablet Oral TID AC   cloNIDine   0.1 mg Transdermal Weekly   finasteride   5 mg Oral Daily   lactulose   20 g Oral TID   nebivolol   20 mg Oral Daily   pantoprazole   40 mg Oral Daily   potassium chloride   40 mEq Oral Once   sodium chloride  flush  3 mL Intravenous Q12H   tamsulosin   0.4 mg Oral Daily   Continuous Infusions:  sodium chloride  20 mL/hr at 01/22/24 0805   PRN Meds: acetaminophen  **OR** acetaminophen , polyethylene glycol   Vital Signs  Vitals:   01/21/24 2346 01/22/24 0329 01/22/24 0713 01/22/24 0925  BP: 125/76 (!) 132/99 (!) 143/86 (!) 126/102  Pulse: 85 94 (!) 48 (!) 209  Resp: 20 19 13  (!) 30  Temp: 98.1 F (36.7 C) 98.6 F (37 C) 97.9 F (36.6 C) 98.2 F (36.8 C)  TempSrc: Oral Oral Oral   SpO2: 93% 94% 93% 95%  Weight:  83.8 kg    Height:        Intake/Output Summary (Last 24 hours) at 01/22/2024 0926 Last data filed at 01/22/2024 0329 Gross per 24 hour  Intake 600 ml  Output 3670 ml  Net -3070 ml      01/22/2024    3:29 AM 01/21/2024    3:54 AM 01/20/2024    6:44 PM  Last 3 Weights  Weight (lbs) 184 lb 11.2 oz 188 lb 7.9 oz 195 lb 5.2 oz  Weight (kg) 83.779 kg 85.5 kg 88.6 kg      Telemetry Atrial fibrillation. PVCs. Rate <100 bpm. - Personally Reviewed  ECG  01/20/24:  Atrial fibrillation.  Rate 108 bpm.  PVCs.  Cannot rule out prior anterior MI.  - Personally Reviewed  Physical Exam  VS:  BP (!) 126/102   Pulse (!) 209   Temp 98.2 F (36.8 C)   Resp (!) 30   Ht 5' 6 (1.676 m)   Wt 83.8 kg   SpO2 95%   BMI 29.81 kg/m  , BMI Body mass index is 29.81 kg/m. GENERAL:  Well appearing HEENT: Pupils equal round and reactive, fundi not visualized,  oral mucosa unremarkable NECK:  No jugular venous distention, waveform within normal limits, carotid upstroke brisk and symmetric, no bruits, no thyromegaly LUNGS:  Diminished at bases  HEART:  Irregularly irregular.  PMI not displaced or sustained,S1 and S2 within normal limits, no S3, no S4, no clicks, no rubs, no murmurs ABD:  Flat, positive bowel sounds normal in frequency in pitch, no bruits, no rebound, no guarding, no midline pulsatile mass, no hepatomegaly, no splenomegaly EXT:  2 plus pulses throughout, 1+ LE edema, no cyanosis no clubbing SKIN:  No rashes no nodules NEURO:  Cranial nerves II through XII grossly intact, motor grossly intact throughout PSYCH:  Cognitively intact, oriented to person place and time   Labs High Sensitivity Troponin:   Recent Labs  Lab 01/20/24 1149 01/20/24 1412  TROPONINIHS 18* 19*     Chemistry Recent Labs  Lab 01/20/24 1149 01/21/24 0320 01/22/24 0233  NA 140 141 141  K 4.0 3.6 3.5  CL 106 105 103  CO2 25 24  25  GLUCOSE 110* 94 105*  BUN 24* 22 33*  CREATININE 1.67* 1.70* 2.03*  CALCIUM  9.3 8.8* 8.5*  MG 1.9 1.9  --   PROT 6.4* 4.8*  --   ALBUMIN 3.5 3.3*  --   AST 20 10*  --   ALT 7 <5  --   ALKPHOS 51 47  --   BILITOT 1.4* 1.3*  --   GFRNONAA 40* 39* 32*  ANIONGAP 9 12 13     Lipids No results for input(s): CHOL, TRIG, HDL, LABVLDL, LDLCALC, CHOLHDL in the last 168 hours.  Hematology Recent Labs  Lab 01/20/24 1149 01/21/24 0320 01/22/24 0233  WBC 10.5 11.2* 11.5*  RBC 4.51 4.48 4.77  HGB 13.3 13.1 13.9  HCT 42.0 40.6 43.0  MCV 93.1 90.6 90.1  MCH 29.5 29.2 29.1  MCHC 31.7 32.3 32.3  RDW 15.7* 15.4 15.4  PLT 159 159 163   Thyroid  No results for input(s): TSH, FREET4 in the last 168 hours.  BNP Recent Labs  Lab 01/20/24 1149  BNP 2,227.1*    DDimer No results for input(s): DDIMER in the last 168 hours.   Radiology  DG Abd 1 View Result Date: 01/21/2024 CLINICAL DATA:  Abdominal pain EXAM:  ABDOMEN - 1 VIEW COMPARISON:  Abdominal x-ray 03/27/2019 FINDINGS: The bowel gas pattern is normal. There is a large amount of stool throughout the colon. There are atherosclerotic calcifications of the splenic artery. Degenerative changes affect the spine and hips. No suspicious calcifications are seen. IMPRESSION: Nonobstructive bowel gas pattern. Large amount of stool throughout the colon. Electronically Signed   By: Greig Pique M.D.   On: 01/21/2024 17:50   ECHOCARDIOGRAM COMPLETE Result Date: 01/21/2024    ECHOCARDIOGRAM REPORT   Patient Name:   Keith [name] Date of Exam: 01/21/2024 Medical Rec #:  [id number]       Height:       66.0 in Accession #:    [id number]      Weight:       188.5 lb Date of Birth:  1940/08/28        BSA:          1.950 m Patient Age:    83 years        BP:           153/97 mmHg Patient Gender: M               HR:           82 bpm. Exam Location:  Inpatient Procedure: 2D Echo, Cardiac Doppler and Color Doppler (Both Spectral and Color            Flow Doppler were utilized during procedure). Indications:    I50.31 Acute diastolic (congestive) heart failure  History:        Patient has prior history of Echocardiogram examinations, most                 recent 07/02/2021. CAD, Arrythmias:Atrial Fibrillation,                 Signs/Symptoms:Parkinson's; Risk Factors:Hypertension and                 Dyslipidemia.  Sonographer:    Damien Senior RDCS Referring Phys: [id number] MARSA NOVAK MELVIN IMPRESSIONS  1. Left ventricular ejection fraction, by estimation, is 30 to 35%. The left ventricle has moderately decreased function. The left ventricle demonstrates global hypokinesis. Left ventricular diastolic parameters are consistent with Grade I diastolic dysfunction (  impaired relaxation).  2. Right ventricular systolic function is normal. The right ventricular size is normal. Mildly increased right ventricular wall thickness.  3. Left atrial size was moderately dilated.  4. Right atrial size was  severely dilated.  5. A small pericardial effusion is present. Large pleural effusion in both left and right lateral regions.  6. The mitral valve is normal in structure. Moderate mitral valve regurgitation. No evidence of mitral stenosis.  7. The aortic valve is calcified. There is mild calcification of the aortic valve. There is mild thickening of the aortic valve. Aortic valve regurgitation is not visualized. Aortic valve sclerosis is present, with no evidence of aortic valve stenosis. Aortic valve mean gradient measures 4.3 mmHg. Aortic valve Vmax measures 1.30 m/s.  8. The inferior vena cava is normal in size with greater than 50% respiratory variability, suggesting right atrial pressure of 3 mmHg. FINDINGS  Left Ventricle: Left ventricular ejection fraction, by estimation, is 30 to 35%. The left ventricle has moderately decreased function. The left ventricle demonstrates global hypokinesis. The left ventricular internal cavity size was normal in size. There is no left ventricular hypertrophy. Left ventricular diastolic parameters are consistent with Grade I diastolic dysfunction (impaired relaxation). Right Ventricle: The right ventricular size is normal. Mildly increased right ventricular wall thickness. Right ventricular systolic function is normal. Left Atrium: Left atrial size was moderately dilated. Right Atrium: Right atrial size was severely dilated. Pericardium: A small pericardial effusion is present. Mitral Valve: The mitral valve is normal in structure. Mild mitral annular calcification. Moderate mitral valve regurgitation. No evidence of mitral valve stenosis. Tricuspid Valve: The tricuspid valve is normal in structure. Tricuspid valve regurgitation is mild . No evidence of tricuspid stenosis. Aortic Valve: The aortic valve is calcified. There is mild calcification of the aortic valve. There is mild thickening of the aortic valve. Aortic valve regurgitation is not visualized. Aortic valve sclerosis  is present, with no evidence of aortic valve stenosis. Aortic valve mean gradient measures 4.3 mmHg. Aortic valve peak gradient measures 6.7 mmHg. Aortic valve area, by VTI measures 1.63 cm. Pulmonic Valve: The pulmonic valve was normal in structure. Pulmonic valve regurgitation is trivial. No evidence of pulmonic stenosis. Aorta: The aortic root is normal in size and structure. Venous: The inferior vena cava is normal in size with greater than 50% respiratory variability, suggesting right atrial pressure of 3 mmHg. IAS/Shunts: No atrial level shunt detected by color flow Doppler. Additional Comments: There is a large pleural effusion in both left and right lateral regions.  LEFT VENTRICLE PLAX 2D LVIDd:         5.70 cm LVIDs:         4.90 cm LV PW:         1.10 cm LV IVS:        1.00 cm LVOT diam:     2.30 cm LV SV:         41 LV SV Index:   21 LVOT Area:     4.15 cm  RIGHT VENTRICLE RV S prime:     9.62 cm/s TAPSE (M-mode): 2.0 cm LEFT ATRIUM              Index        RIGHT ATRIUM           Index LA diam:        4.90 cm  2.51 cm/m   RA Area:     29.70 cm LA Vol (A2C):   110.1 ml  56.46 ml/m  RA Volume:   117.00 ml 60.00 ml/m LA Vol (A4C):   80.7 ml  41.38 ml/m LA Biplane Vol: 90.7 ml  46.51 ml/m  AORTIC VALVE AV Area (Vmax):    1.68 cm AV Area (Vmean):   1.66 cm AV Area (VTI):     1.63 cm AV Vmax:           129.67 cm/s AV Vmean:          96.767 cm/s AV VTI:            0.253 m AV Peak Grad:      6.7 mmHg AV Mean Grad:      4.3 mmHg LVOT Vmax:         52.48 cm/s LVOT Vmean:        38.750 cm/s LVOT VTI:          0.099 m LVOT/AV VTI ratio: 0.39  AORTA Ao Root diam: 3.40 cm Ao Asc diam:  3.60 cm  SHUNTS Systemic VTI:  0.10 m Systemic Diam: 2.30 cm Oneil Parchment MD Electronically signed by Oneil Parchment MD Signature Date/Time: 01/21/2024/2:23:04 PM    Final    CT CHEST ABDOMEN PELVIS WO CONTRAST Result Date: 01/20/2024 CLINICAL DATA:  The unintended weight loss EXAM: CT CHEST, ABDOMEN AND PELVIS WITHOUT CONTRAST  TECHNIQUE: Multidetector CT imaging of the chest, abdomen and pelvis was performed following the standard protocol without IV contrast. RADIATION DOSE REDUCTION: This exam was performed according to the departmental dose-optimization program which includes automated exposure control, adjustment of the mA and/or kV according to patient size and/or use of iterative reconstruction technique. COMPARISON:  Same day chest radiograph and prior studies FINDINGS: CT CHEST FINDINGS Cardiovascular: Normal caliber aorta. Scattered aortic calcifications. No pericardial effusion. Mediastinum/Nodes: No lymphadenopathy. Lungs/Pleura: Small to moderate bilateral pleural effusions with associated bibasilar atelectasis/consolidation. Interlobular septal thickening, suggestive of mild pulmonary edema. Tiny 0.5 cm right middle lobe pulmonary nodule, unchanged from prior. Musculoskeletal: No acute osseous findings. CT ABDOMEN PELVIS FINDINGS Hepatobiliary: Unremarkable. Pancreas: Unremarkable. Spleen: Unremarkable. Adrenals/Urinary Tract: Adrenal glands are unremarkable. Symmetric nephrograms. No hydronephrosis or nephrolithiasis. 8.2 x 5.4 cm left renal cyst. Stomach/Bowel: No evidence of bowel obstruction or inflammation. The visualized appendix is unremarkable. Vascular/Lymphatic: Aortic atherosclerosis. No enlarged abdominal or pelvic lymph nodes. Reproductive: Enlarged prostate, measuring approximately 5.7 by 5.4 cm. Scattered prostatic calcifications, similar to prior. Other: No free air free fluid. Fat containing bilateral inguinal and umbilical hernias. Musculoskeletal: No acute osseous findings. IMPRESSION: 1. Small to moderate bilateral pleural effusions with likely mild interstitial pulmonary edema. 2. Stable additional chronic and incidental findings as noted above. Electronically Signed   By: Michaeline Blanch M.D.   On: 01/20/2024 14:41   DG Shoulder Left Result Date: 01/20/2024 EXAM: 1 VIEW XRAY OF THE LEFT SHOULDER  01/20/2024 01:23:00 PM COMPARISON: None available. CLINICAL HISTORY: L shoulder pain. FINDINGS: BONES AND JOINTS: No fracture. Moderate osteoarthritis of the acromioclavicular joint. Superior subluxation of humeral head and remodeling of the undersurface of the acromion . SOFT TISSUES: No abnormal calcifications. Visualized lung is unremarkable. IMPRESSION: 1. No fracture. 2. Superior subluxation of the humeral head and remodeling of the undersurface of the acromion, likely reflecting chronic rotator cuff pathology. Electronically signed by: Selinda Blue MD 01/20/2024 02:14 PM EDT RP Workstation: [id number]   DG Chest Portable 1 View Result Date: 01/20/2024 EXAM: 1 VIEW XRAY OF THE CHEST 01/20/2024 12:01:00 PM COMPARISON: 05/25/2023 CLINICAL HISTORY: SOB, atrial fibrillation FINDINGS: LUNGS AND PLEURA: Small bilateral pleural effusions.  Mild pulmonary edema. Bibasilar atelectasis. HEART AND MEDIASTINUM: Borderline mild cardiomegaly. BONES AND SOFT TISSUES: No acute osseous abnormality. IMPRESSION: 1. Mild CHF with small bilateral pleural effusions and bibasilar atelectasis. Electronically signed by: Selinda Blue MD 01/20/2024 12:05 PM EDT RP Workstation: [id number]    Cardiac Studies Echo 01/21/24: 1. Left ventricular ejection fraction, by estimation, is 30 to 35%. The  left ventricle has moderately decreased function. The left ventricle  demonstrates global hypokinesis. Left ventricular diastolic parameters are  consistent with Grade I diastolic  dysfunction (impaired relaxation).   2. Right ventricular systolic function is normal. The right ventricular  size is normal. Mildly increased right ventricular wall thickness.   3. Left atrial size was moderately dilated.   4. Right atrial size was severely dilated.   5. A small pericardial effusion is present. Large pleural effusion in  both left and right lateral regions.   6. The mitral valve is normal in structure. Moderate mitral valve  regurgitation. No  evidence of mitral stenosis.   7. The aortic valve is calcified. There is mild calcification of the  aortic valve. There is mild thickening of the aortic valve. Aortic valve  regurgitation is not visualized. Aortic valve sclerosis is present, with  no evidence of aortic valve stenosis.  Aortic valve mean gradient measures 4.3 mmHg. Aortic valve Vmax measures  1.30 m/s.   8. The inferior vena cava is normal in size with greater than 50%  respiratory variability, suggesting right atrial pressure of 3 mmHg.    Patient Profile   83 y.o. male with hypertension, HFimpEF, CKD 3, DVT, atrial fibrillation,   Assessment & Plan   # HFrEF:  # Hypertension:  LVEF was previously reduced to 35-40%.  Subsequently improved to 50-55% on his most recent echocardiogram 06/2021.  This admission it is back to 30-35%.  He is now here with heart failure symptoms and atrial fibrillation. Yesterday he was a -2.7 L.  -5.2L this admission.   His weight is improving down from 89 kg on admission to 83. kg today.  Continue blood pressure has been quite labile ranging from the 90s systolic to most recently 153/97. Renal function is declining.  Hold IV lasix .  Given his reduced systolic function recommend changing his HTN regimen.  He had cough with lisinopril and valsartan.  Swelling with labetalol.  Will need to discuss with him.  # Atrial fibrillation:  Rate is controlled on nebivolol  agree with prior recommendation to pursue TEE/DCCV given his heart failure presentation and new A-fib diagnosis.  Discussed with the patient and he is okay with this.  Attempted to call his daughter-in-law with no answer on the phone x2  Continue Eliquis .  N.p.o. for TEE/DCCV today.  Informed Consent   Shared Decision Making/Informed Consent   The risks [stroke, cardiac arrhythmias rarely resulting in the need for a temporary or permanent pacemaker, skin irritation or burns, esophageal damage, perforation (1:10,000 risk), bleeding,  pharyngeal hematoma as well as other potential complications associated with conscious sedation including aspiration, arrhythmia, respiratory failure and death], benefits (treatment guidance, restoration of normal sinus rhythm, diagnostic support) and alternatives of a transesophageal echocardiogram guided cardioversion were discussed in detail with Mr. Virts and he is willing to proceed.     # CKD 3a:  Renal function stable worse today.  Hold lasix .   For questions or updates, please contact [facility] Please consult [email or web address] for contact info under     Signed, Annabella Scarce, MD  01/22/2024, 9:26 AM

## 2024-01-22 NOTE — Assessment & Plan Note (Signed)
No acute flare.  

## 2024-01-22 NOTE — Assessment & Plan Note (Addendum)
 Renal function today with serum cr at 2,0 with K at 3,5 and serum bicarbonate at 25  Na 141   Continue to hold on loop diuretic today Follow up renal function and electrolytes in am.  Consider adding SGLT 2 inh  Add KCl 40 meq to prevent hypokalemia.

## 2024-01-22 NOTE — Progress Notes (Signed)
  Progress Note  Patient Name: Keith [name] Date of Encounter: 01/22/2024 [facility] Cardiologist: Gordy Bergamo, MD   Interval Summary   Patient was downstairs for TEE/DCCV  Will check back after procedure complete   Vital Signs Vitals:   01/21/24 2346 01/22/24 0329 01/22/24 0713 01/22/24 0925  BP: 125/76 (!) 132/99 (!) 143/86 (!) 126/102  Pulse: 85 94 (!) 48 (!) 209  Resp: 20 19 13  (!) 30  Temp: 98.1 F (36.7 C) 98.6 F (37 C) 97.9 F (36.6 C) 98.2 F (36.8 C)  TempSrc: Oral Oral Oral   SpO2: 93% 94% 93% 95%  Weight:  83.8 kg    Height:        Intake/Output Summary (Last 24 hours) at 01/22/2024 9062 Last data filed at 01/22/2024 9670 Gross per 24 hour  Intake 600 ml  Output 3250 ml  Net -2650 ml      01/22/2024    3:29 AM 01/21/2024    3:54 AM 01/20/2024    6:44 PM  Last 3 Weights  Weight (lbs) 184 lb 11.2 oz 188 lb 7.9 oz 195 lb 5.2 oz  Weight (kg) 83.779 kg 85.5 kg 88.6 kg     Telemetry/ECG  A. Fib, HR 90s - Personally Reviewed  Physical Exam   Unable to complete physical exam Patient was downstairs getting TEE/DCCV  Assessment & Plan   Persistent atrial fibrillation Frequent PVCs Diagnosed July 2025 Set up for outpatient DCCV, family canceled  Presented in A. Fib with RVR with frequent PVCs Started on Eliquis , but had some missed doses  NPO since midnight Plan for TEE/DCCV today 9/3 Continue Eliquis  2.5 mg daily (appropriate given age, renal function) Continue Nebivolol  20 mg daily   Acute on chronic HFpEF Echo this admission: LVEF 30-35%, global hypokinesis, G1DD, normal RV function, BAE, small pericardial effusion, large bilateral pleural effusions, moderate MR, normal IVC Creatinine rising today  Weight 184 lb (down from 195 lb on admission)  Hypertension Home meds: Nebivolol  20 mg daily, clonidine  patch 0.1 mg   Hyperlipidemia Continue Lipitor 10 mg daily   Per primary GERD CKD Parkinson's disease History of DVT BPH Gout     For questions or updates, please contact [facility] Please consult [email or web address] for contact info under       Signed, Waddell DELENA Donath, PA-C

## 2024-01-22 NOTE — Assessment & Plan Note (Signed)
 Continue pantoprazole .

## 2024-01-22 NOTE — Transfer of Care (Signed)
 Immediate Anesthesia Transfer of Care Note  Patient: Keith [name]  Procedure(s) Performed: TRANSESOPHAGEAL ECHOCARDIOGRAM CARDIOVERSION  Patient Location: PACU and Cath Lab  Anesthesia Type:General  Level of Consciousness: sedated  Airway & Oxygen Therapy: Patient Spontanous Breathing and Patient connected to nasal cannula oxygen  Post-op Assessment: Report given to RN and Post -op Vital signs reviewed and stable  Post vital signs: stable  Last Vitals:  Vitals Value Taken Time  BP    Temp    Pulse 51 01/22/24 11:43  Resp 17 01/22/24 11:43  SpO2 95 % 01/22/24 11:43    Last Pain:  Vitals:   01/22/24 0934  TempSrc:   PainSc: 0-No pain         Complications: No notable events documented.

## 2024-01-22 NOTE — CV Procedure (Signed)
   Transesophageal Echocardiogram  Indications: Atrial fibrillation  Time out performed  During this procedure the patient was administered propofol  under anesthesiology supervision to achieve and maintain moderate sedation.  The patient's heart rate, blood pressure, and oxygen saturation are monitored continuously during the procedure.   Findings:  Left Ventricle: Severely reduced ejection fraction EF 20 to 25% global hypokinesis  Mitral Valve: Mild to moderate mitral valve regurgitation  Aortic Valve: Trileaflet, mildly calcified, no stenosis no AI  Tricuspid Valve: Mild tricuspid digitation  Left Atrium: Smoke in the left atrium however no left atrial appendage thrombus, dilated  Right Atrium: Dilated  Intraatrial septum: PFO noted by color-flow Doppler   Keith Parchment, MD     Electrical Cardioversion Procedure Note Keith [name] [id number] Feb 18, 1941  Procedure: Electrical Cardioversion Indications:  Atrial Fibrillation  Time Out: Verified patient identification, verified procedure,medications/allergies/relevent history reviewed, required imaging and test results available.  Performed  Procedure Details  The patient was NPO after midnight. Anesthesia was administered at the beside  by Dr.Turk with propofol .  Cardioversion was performed with synchronized biphasic defibrillation via AP pads with 200 joules.  1 attempt(s) were performed.  The patient converted to normal sinus rhythm. The patient tolerated the procedure well   IMPRESSION:  Successful cardioversion of atrial fibrillation Sinus rhythm with PACs noted, occasional PVCs.    Keith [name] 01/22/2024, 11:40 AM

## 2024-01-22 NOTE — Assessment & Plan Note (Signed)
>>  ASSESSMENT AND PLAN FOR PAROXYSMAL ATRIAL FIBRILLATION (HCC) WRITTEN ON 01/24/2024 12:00 PM BY ARRIEN, MAURICIO DANIEL, MD  09/03 direct current cardioversion. On Nebivolol  for B blockade.   Telemetry has been sinus bradycardia with PAC and PVC.  Continue anticoagulation with apixaban .

## 2024-01-22 NOTE — Anesthesia Preprocedure Evaluation (Signed)
 Anesthesia Evaluation  Patient identified by MRN, date of birth, ID band Patient awake    Reviewed: Allergy & Precautions, NPO status , Patient's Chart, lab work & pertinent test results  Airway Mallampati: II  TM Distance: >3 FB Neck ROM: Full    Dental  (+) Teeth Intact, Dental Advisory Given   Pulmonary asthma    Pulmonary exam normal breath sounds clear to auscultation       Cardiovascular hypertension, Pt. on home beta blockers and Pt. on medications (-) angina + CAD, +CHF and + DVT  + dysrhythmias Atrial Fibrillation + Valvular Problems/Murmurs MR  Rhythm:Regular Rate:Normal + Systolic murmurs Echo 01/21/24: 1. Left ventricular ejection fraction, by estimation, is 30 to 35%. The  left ventricle has moderately decreased function. The left ventricle  demonstrates global hypokinesis. Left ventricular diastolic parameters are  consistent with Grade I diastolic  dysfunction (impaired relaxation).   2. Right ventricular systolic function is normal. The right ventricular  size is normal. Mildly increased right ventricular wall thickness.   3. Left atrial size was moderately dilated.   4. Right atrial size was severely dilated.   5. A small pericardial effusion is present. Large pleural effusion in  both left and right lateral regions.   6. The mitral valve is normal in structure. Moderate mitral valve  regurgitation. No evidence of mitral stenosis.   7. The aortic valve is calcified. There is mild calcification of the  aortic valve. There is mild thickening of the aortic valve. Aortic valve  regurgitation is not visualized. Aortic valve sclerosis is present, with  no evidence of aortic valve stenosis.  Aortic valve mean gradient measures 4.3 mmHg. Aortic valve Vmax measures  1.30 m/s.   8. The inferior vena cava is normal in size with greater than 50%  respiratory variability, suggesting right atrial pressure of 3 mmHg.      Neuro/Psych  Headaches PD TIA   GI/Hepatic Neg liver ROS,GERD  ,,  Endo/Other  negative endocrine ROS    Renal/GU Renal InsufficiencyRenal disease     Musculoskeletal negative musculoskeletal ROS (+)    Abdominal   Peds  Hematology negative hematology ROS (+)   Anesthesia Other Findings Day of surgery medications reviewed with the patient.  Reproductive/Obstetrics                              Anesthesia Physical Anesthesia Plan  ASA: 4  Anesthesia Plan: General   Post-op Pain Management: Minimal or no pain anticipated   Induction: Intravenous  PONV Risk Score and Plan: 2 and TIVA  Airway Management Planned: Natural Airway and Simple Face Mask  Additional Equipment:   Intra-op Plan:   Post-operative Plan:   Informed Consent: I have reviewed the patients History and Physical, chart, labs and discussed the procedure including the risks, benefits and alternatives for the proposed anesthesia with the patient or authorized representative who has indicated his/her understanding and acceptance.   Patient has DNR.  Discussed DNR with patient and Suspend DNR.   Dental advisory given  Plan Discussed with: CRNA  Anesthesia Plan Comments:          Anesthesia Quick Evaluation

## 2024-01-22 NOTE — Progress Notes (Signed)
 Progress Note   Patient: Keith [name] FMW:[id number] DOB: [birth year] DOA: 01/20/2024     2 DOS: the patient was seen and examined on 01/22/2024   Brief hospital course: Keith [name] was admitted to the hospital with the working diagnosis of heart failure exacerbation.   83 yo male with the past medical history of chronic diastolic CHF, CKD 3a, DVT, was diagnosed with A-fib over a month ago, prescribed Eliquis  and recommended to have outpatient cardioversion, family canceled this and wanted a second opinion.  Presented to the ED 9/1 with weakness, shortness of breath, some left-sided abdominal pain.  History of poor compliance with medications.  In the ED A-fib heart rate 60s to 100s, blood pressure stable, creatinine 1.6, BNP 2227, chest x-ray with mild CHF and bilateral pleural effusions,   09/03 successful direct current cardioversion.   Assessment and Plan: * Acute on chronic systolic CHF (congestive heart failure) (HCC) Echocardiogram with reduced LV systolic function with EF 30 to 35%, global hypokinesis, RV systolic function preserved, with mild increased wall thickness, LA with moderate dilatation and RA with severe dilatation, small pericardial effusion, moderate mitral regurgitation   Urine output is 3,670 ml Systolic blood pressure 120 mmHg range.   Plan to continue medical therapy with nebivolol  20 mg daily  To consider adding SGLT 2 inh.  Holding further RAAS inhibition until blood pressure and renal function more stable.   Paroxysmal atrial fibrillation (HCC) 09/03 direct current cardioversion. Continue telemetry monitoring Nebivolol  for B blockade.  Continue anticoagulation with apixaban .   Essential hypertension Continue blood pressure monitoring On nebivolol  and clonidine  (patch).   Chronic kidney disease, stage 3b (HCC) Renal function today with serum cr at 2,0 with K at 3,5 and serum bicarbonate at 25  Na 141   Continue to hold on loop diuretic today Follow up  renal function and electrolytes in am.  Consider adding SGLT 2 inh  Add KCl 40 meq to prevent hypokalemia.   Atherosclerotic heart disease of native coronary artery without angina pectoris Dyslipidemia.  No chest pain, no acute coronary syndrome.  Continue statin    GERD Continue pantoprazole .   Gout No acute flare.   Benign prostatic hyperplasia No signs of urinary retention  Continue with tamsulosin  and finesteride.   Parkinsonism (HCC) Continue with sinemet .  PT and OT         Subjective: Patient is feeling well after direct current cardioversion, no chest pain, no dyspnea, no PND or lower extremity edema   Physical Exam: Vitals:   01/22/24 1220 01/22/24 1225 01/22/24 1230 01/22/24 1250  BP: (!) 121/99 105/68 (!) 114/90 (!) 120/101  Pulse: 92 (!) 115 99 62  Resp: 20 17 17 20   Temp:    97.7 F (36.5 C)  TempSrc:    Oral  SpO2: 93% 91% 92% 96%  Weight:      Height:       Neurology awake and alert ENT with mild pallor Cardiovascular with S1 and S2 present and regular with no gallops or rubs, positive systolic murmur at the apex Respiratory with mild rales at the right base with no wheezing or rhonchi  Abdomen with no distention, non tender No lower extremity edema   Data Reviewed:    Family Communication: no family at the bedside   Disposition: Status is: Inpatient Remains inpatient appropriate because: recovering heart failure exacerbation   Planned Discharge Destination: Home    Author: Elidia Toribio Furnace, MD 01/22/2024 2:33 PM  For on call review [email or web address].

## 2024-01-22 NOTE — Plan of Care (Signed)

## 2024-01-22 NOTE — Assessment & Plan Note (Addendum)
 Dyslipidemia.  No chest pain, no acute coronary syndrome.  Continue statin

## 2024-01-22 NOTE — Assessment & Plan Note (Addendum)
 Echocardiogram with reduced LV systolic function with EF 30 to 35%, global hypokinesis, RV systolic function preserved, with mild increased wall thickness, LA with moderate dilatation and RA with severe dilatation, small pericardial effusion, moderate mitral regurgitation   Urine output is 3,670 ml Systolic blood pressure 120 mmHg range.   Plan to continue medical therapy with nebivolol  20 mg daily  To consider adding SGLT 2 inh.  Holding further RAAS inhibition until blood pressure and renal function more stable.

## 2024-01-22 NOTE — Assessment & Plan Note (Signed)
 Continue with sinemet .  PT and OT

## 2024-01-22 NOTE — Evaluation (Signed)
 Occupational Therapy Evaluation Patient Details Name: Keith [name] MRN: [id number] DOB: 10-16-1940 Today's Date: 01/22/2024   History of Present Illness   Pt is a 83 y.o. male admitted 9/1 for SOB and abdominal pain. Chest x-ray showed  small bilateral effusions and bibasilar atelectasis.  PMH:  HTN, CHF, CKD, DVT, GERD, afib, Parkinson's     Clinical Impressions Pt admitted based on above, and was seen based on problem list below. PTA pt was living with his wife and grandson. Pt reports x2 falls recently, so grandson has moved in to assist with IADLs and supervise mobility. Today pt is requiring set up  to CGA for ADLs. Bed mobility was mod assist and functional transfers are  min assist with RW. Pt noted hx of L rotator cuff injury causing decreased shoulder flexion and abduction AROM. Pt would benefit from [facility] for continued strengthening and AROM for LUE. DME needs below. OT will continue to follow acutely to maximize functional independence.        If plan is discharge home, recommend the following:   A little help with walking and/or transfers;A little help with bathing/dressing/bathroom;Assistance with cooking/housework     Functional Status Assessment   Patient has had a recent decline in their functional status and demonstrates the ability to make significant improvements in function in a reasonable and predictable amount of time.     Equipment Recommendations   BSC/3in1      Precautions/Restrictions   Precautions Precautions: Fall Recall of Precautions/Restrictions: Intact Restrictions Weight Bearing Restrictions Per Provider Order: No     Mobility Bed Mobility Overal bed mobility: Needs Assistance Bed Mobility: Supine to Sit     Supine to sit: Mod assist     General bed mobility comments: Mod assist to manage trunk    Transfers Overall transfer level: Needs assistance Equipment used: Rolling walker (2 wheels) Transfers: Sit to/from Stand Sit  to Stand: Min assist     General transfer comment: Min assist to come to stand, once in standing CGA for balance with mobility      Balance Overall balance assessment: Needs assistance Sitting-balance support: No upper extremity supported, Feet supported Sitting balance-Leahy Scale: Fair     Standing balance support: Bilateral upper extremity supported, During functional activity, Reliant on assistive device for balance Standing balance-Leahy Scale: Poor Standing balance comment: Reliant on RW         ADL either performed or assessed with clinical judgement   ADL Overall ADL's : Needs assistance/impaired Eating/Feeding: Set up;Sitting   Grooming: Wash/dry face;Contact guard assist;Standing Grooming Details (indicate cue type and reason): CGA for standing balance         Upper Body Dressing : Set up;Sitting   Lower Body Dressing: Contact guard assist;Sit to/from stand Lower Body Dressing Details (indicate cue type and reason): CGA for balance in standing. Increased time and effort for socks Toilet Transfer: Contact guard assist;Rolling walker (2 wheels);Ambulation Toilet Transfer Details (indicate cue type and reason): CGA simulated in room Toileting- Clothing Manipulation and Hygiene: Contact guard assist;Sit to/from stand Toileting - Clothing Manipulation Details (indicate cue type and reason): CGA for balance in standing     Functional mobility during ADLs: Contact guard assist;Rolling walker (2 wheels) General ADL Comments: Increased time and effort, decreased balance     Vision Baseline Vision/History: 1 Wears glasses Patient Visual Report: No change from baseline Vision Assessment?: No apparent visual deficits            Pertinent Vitals/Pain Pain Assessment Pain  Assessment: Faces Faces Pain Scale: Hurts little more Pain Location: L shoulder Pain Descriptors / Indicators: Discomfort, Grimacing Pain Intervention(s): Monitored during session      Extremity/Trunk Assessment Upper Extremity Assessment Upper Extremity Assessment: LUE deficits/detail;Generalized weakness LUE Deficits / Details: DG showed Superior subluxation of the humeral head. ~80 degrees of shoulder flex and abd AROM, internal rotation WFL LUE: Shoulder pain with ROM LUE Sensation: WNL LUE Coordination: decreased gross motor;decreased fine motor   Lower Extremity Assessment Lower Extremity Assessment: Defer to PT evaluation   Cervical / Trunk Assessment Cervical / Trunk Assessment: Normal   Communication Communication Communication: No apparent difficulties   Cognition Arousal: Alert Behavior During Therapy: WFL for tasks assessed/performed Cognition: No apparent impairments         Following commands: Intact       Cueing  General Comments   Cueing Techniques: Verbal cues  BP slightly elevated during session, max HR 101           Home Living Family/patient expects to be discharged to:: Private residence Living Arrangements: Spouse/significant other;Other relatives (Grandson) Available Help at Discharge: Family;Available 24 hours/day Type of Home: House Home Access: Stairs to enter [employer] of Steps: 1 Entrance Stairs-Rails: None Home Layout: One level     Bathroom Shower/Tub: Producer, television/film/video: Handicapped height Bathroom Accessibility: No   Home Equipment: Agricultural consultant (2 wheels);Shower seat;Grab bars - tub/shower   Additional Comments: Grandson recently moved in d/t fall hx of pt and wife. Works from home available 24/7      Prior Functioning/Environment Prior Level of Function : Needs assist             Mobility Comments: Pt using RW. Reports hx of 2 falls within last 6 months. ADLs Comments: Assist for buttons, grandson completing IADLs    OT Problem List: Decreased strength;Decreased range of motion;Decreased activity tolerance;Impaired balance (sitting and/or standing);Decreased  safety awareness;Impaired UE functional use;Decreased knowledge of use of DME or AE;Cardiopulmonary status limiting activity   OT Treatment/Interventions: Self-care/ADL training;Therapeutic exercise;Energy conservation;DME and/or AE instruction;Therapeutic activities;Patient/family education;Balance training      OT Goals(Current goals can be found in the care plan section)   Acute Rehab OT Goals Patient Stated Goal: To go home OT Goal Formulation: With patient Time For Goal Achievement: 02/05/24 Potential to Achieve Goals: Good   OT Frequency:  Min 2X/week       AM-PAC OT 6 Clicks Daily Activity     Outcome Measure Help from another person eating meals?: None Help from another person taking care of personal grooming?: A Little Help from another person toileting, which includes using toliet, bedpan, or urinal?: A Little Help from another person bathing (including washing, rinsing, drying)?: A Little Help from another person to put on and taking off regular upper body clothing?: A Little Help from another person to put on and taking off regular lower body clothing?: A Little 6 Click Score: 19   End of Session Equipment Utilized During Treatment: Gait belt;Rolling walker (2 wheels) Nurse Communication: Mobility status  Activity Tolerance: Patient tolerated treatment well Patient left: in bed;with call bell/phone within reach;with nursing/sitter in room  OT Visit Diagnosis: Unsteadiness on feet (R26.81);Other abnormalities of gait and mobility (R26.89);Repeated falls (R29.6);Muscle weakness (generalized) (M62.81);Pain Pain - Right/Left: Left Pain - part of body: Shoulder                Time: 9265-9244 OT Time Calculation (min): 21 min Charges:  OT General Charges $OT Visit:  1 Visit OT Evaluation $OT Eval Moderate Complexity: 1 Mod  Adrianne BROCKS, OT  Acute Rehabilitation Services Office [phone] Secure chat preferred   Adrianne GORMAN Savers 01/22/2024, 8:14 AM

## 2024-01-22 NOTE — Interval H&P Note (Signed)
 History and Physical Interval Note:  01/22/2024 10:21 AM  Tanda LABOR Kocher  has presented today for surgery, with the diagnosis of aifb.  The various methods of treatment have been discussed with the patient and family. After consideration of risks, benefits and other options for treatment, the patient has consented to  Procedure(s): TRANSESOPHAGEAL ECHOCARDIOGRAM (N/A) CARDIOVERSION (N/A) as a surgical intervention.  The patient's history has been reviewed, patient examined, no change in status, stable for surgery.  I have reviewed the patient's chart and labs.  Questions were answered to the patient's satisfaction.     [employer]

## 2024-01-22 NOTE — Assessment & Plan Note (Signed)
 09/03 direct current cardioversion. On Nebivolol  for B blockade.   Telemetry has been sinus bradycardia with PAC and PVC.  Continue telemetry monitoring  Continue anticoagulation with apixaban .

## 2024-01-23 DIAGNOSIS — I10 Essential (primary) hypertension: Secondary | ICD-10-CM | POA: Diagnosis not present

## 2024-01-23 DIAGNOSIS — I4891 Unspecified atrial fibrillation: Secondary | ICD-10-CM

## 2024-01-23 DIAGNOSIS — I480 Paroxysmal atrial fibrillation: Secondary | ICD-10-CM | POA: Diagnosis not present

## 2024-01-23 DIAGNOSIS — N1831 Chronic kidney disease, stage 3a: Secondary | ICD-10-CM | POA: Diagnosis not present

## 2024-01-23 DIAGNOSIS — I509 Heart failure, unspecified: Secondary | ICD-10-CM

## 2024-01-23 DIAGNOSIS — I5023 Acute on chronic systolic (congestive) heart failure: Secondary | ICD-10-CM | POA: Diagnosis not present

## 2024-01-23 DIAGNOSIS — N1832 Chronic kidney disease, stage 3b: Secondary | ICD-10-CM | POA: Diagnosis not present

## 2024-01-23 LAB — BASIC METABOLIC PANEL WITH GFR
Anion gap: 13 (ref 5–15)
BUN: 30 mg/dL — ABNORMAL HIGH (ref 8–23)
CO2: 26 mmol/L (ref 22–32)
Calcium: 8.8 mg/dL — ABNORMAL LOW (ref 8.9–10.3)
Chloride: 102 mmol/L (ref 98–111)
Creatinine, Ser: 1.87 mg/dL — ABNORMAL HIGH (ref 0.61–1.24)
GFR, Estimated: 35 mL/min — ABNORMAL LOW (ref >60)
Glucose, Bld: 100 mg/dL — ABNORMAL HIGH (ref 70–99)
Potassium: 3.4 mmol/L — ABNORMAL LOW (ref 3.5–5.1)
Sodium: 141 mmol/L (ref 135–145)

## 2024-01-23 LAB — MAGNESIUM: Magnesium: 2.1 mg/dL (ref 1.7–2.4)

## 2024-01-23 MED ORDER — PHENOL 1.4 % MT LIQD
1.0000 | OROMUCOSAL | Status: DC | PRN
Start: 2024-01-23 — End: 2024-01-24
  Administered 2024-01-23: 1 via OROMUCOSAL
  Filled 2024-01-23: qty 177

## 2024-01-23 MED ORDER — POTASSIUM CHLORIDE CRYS ER 20 MEQ PO TBCR
40.0000 meq | EXTENDED_RELEASE_TABLET | ORAL | Status: AC
  Administered 2024-01-23 (×2): 40 meq via ORAL
  Filled 2024-01-23 (×2): qty 2

## 2024-01-23 MED ORDER — IRBESARTAN 75 MG PO TABS
75.0000 mg | ORAL_TABLET | Freq: Every day | ORAL | Status: DC
  Administered 2024-01-23 – 2024-01-24 (×2): 75 mg via ORAL
  Filled 2024-01-23 (×2): qty 1

## 2024-01-23 NOTE — Progress Notes (Signed)
 Progress Note   Patient: Keith [name] FMW:[id number] DOB: 1941-03-05 DOA: 01/20/2024     3 DOS: the patient was seen and examined on 01/23/2024   Brief hospital course: Keith [name] was admitted to the hospital with the working diagnosis of heart failure exacerbation.   83 yo male with the past medical history of chronic diastolic CHF, CKD 3a, DVT, was diagnosed with A-fib over a month ago, prescribed Eliquis  and recommended to have outpatient cardioversion, family canceled this and wanted a second opinion.  Presented to the ED 9/1 with weakness, shortness of breath, some left-sided abdominal pain.  History of poor compliance with medications.  In the ED A-fib heart rate 60s to 100s, blood pressure stable, creatinine 1.6, BNP 2227, chest x-ray with mild CHF and bilateral pleural effusions,   09/03 successful direct current cardioversion.   Assessment and Plan: * Acute on chronic systolic CHF (congestive heart failure) (HCC) Echocardiogram with reduced LV systolic function with EF 30 to 35%, global hypokinesis, RV systolic function preserved, with mild increased wall thickness, LA with moderate dilatation and RA with severe dilatation, small pericardial effusion, moderate mitral regurgitation   Urine output is 1,300 ml Systolic blood pressure 120 mmHg range.   Plan to continue medical therapy with nebivolol  20 mg daily  Continue with SGLT 2 inh.  Holding further RAAS inhibition until blood pressure and renal function more stable.   Paroxysmal atrial fibrillation (HCC) 09/03 direct current cardioversion. On Nebivolol  for B blockade.   Telemetry has been sinus bradycardia with PAC and PVC.  Continue telemetry monitoring  Continue anticoagulation with apixaban .   Essential hypertension Continue blood pressure monitoring On nebivolol  and clonidine  (patch).   Chronic kidney disease, stage 3b (HCC) Hypokalemia AKI  Today renal function with serum cr at 1,87 with K at 3,4 and serum  bicarbonate at 26  Na 141 Mg 2.1   Continue to hold on loop diuretic today Add KCl 40 meq x2 Follow up renal function and electrolytes.  Holding loop diuretic for now.   Atherosclerotic heart disease of native coronary artery without angina pectoris Dyslipidemia.  No chest pain, no acute coronary syndrome.  Continue statin    GERD Continue pantoprazole .   Gout No acute flare.   Benign prostatic hyperplasia No signs of urinary retention  Continue with tamsulosin  and finesteride.   Parkinsonism (HCC) Continue with sinemet .  PT and OT         Subjective: Patient with improvement in symptoms, no chest pain and no dyspnea. No PND or orthopnea, feeling week and deconditioned not back to his baseline   Physical Exam: Vitals:   01/23/24 0415 01/23/24 0736 01/23/24 1121 01/23/24 1605  BP: (!) 117/97 119/74 122/86 111/82  Pulse: 96 (!) 36 60 60  Resp: 20 18 16 14   Temp: 98 F (36.7 C) 97.7 F (36.5 C) (!) 97.4 F (36.3 C) (!) 97.5 F (36.4 C)  TempSrc: Oral Oral Oral Oral  SpO2: 96% 96% 100% 98%  Weight: 83.1 kg     Height:       Neurology awake and alert ENT with mild pallor Cardiovascular with S1 and S2 present, regular and bradycardia, occasional extra beat, systolic murmur at the apex.  No JVD Respiratory with no rales or wheezing, no rhonchi  Abdomen with no distention  No lower extremity edema  Data Reviewed:    Family Communication: no family at the bedside   Disposition: Status is: Inpatient Remains inpatient appropriate because: recovering heart failure   Planned  Discharge Destination: Home     Author: Tahlia Deamer Daniel Daksh Coates, MD 01/23/2024 4:38 PM  For on call review [email or web address].

## 2024-01-23 NOTE — Progress Notes (Signed)
 Mobility Specialist Progress Note:   01/23/24 1020  Mobility  Activity Ambulated with assistance  Level of Assistance Contact guard assist, steadying assist  Assistive Device Front wheel walker  Distance Ambulated (ft) 150 ft  Activity Response Tolerated well  Mobility Referral Yes  Mobility visit 1 Mobility  Mobility Specialist Start Time (ACUTE ONLY) 1020  Mobility Specialist Stop Time (ACUTE ONLY) 1032  Mobility Specialist Time Calculation (min) (ACUTE ONLY) 12 min   Pt agreeable to mobility session. Required contact assist throughout ambulation with RW. No knee buckling noted. Pt back sitting EOB with all needs met.   Therisa Rana Mobility Specialist Please contact via SecureChat or  Rehab office at [phone]

## 2024-01-23 NOTE — Progress Notes (Signed)
 Rounding Note   Patient Name: Keith [name] Date of Encounter: 01/23/2024  [facility] Cardiologist: Gordy Bergamo, MD   Subjective Feeling well.  Denies any shortness of breath.  Ambulated without difficulty.  Scheduled Meds:  apixaban   2.5 mg Oral BID   atorvastatin   10 mg Oral Daily   carbidopa -levodopa   1 tablet Oral TID AC   cloNIDine   0.1 mg Transdermal Weekly   finasteride   5 mg Oral Daily   lactulose   20 g Oral TID   nebivolol   20 mg Oral Daily   pantoprazole   40 mg Oral Daily   potassium chloride   40 mEq Oral Q4H   sodium chloride  flush  3 mL Intravenous Q12H   tamsulosin   0.4 mg Oral Daily   Continuous Infusions:   PRN Meds: acetaminophen  **OR** acetaminophen , phenol, polyethylene glycol   Vital Signs  Vitals:   01/22/24 1948 01/23/24 0015 01/23/24 0415 01/23/24 0736  BP: 96/64 105/62 (!) 117/97 119/74  Pulse: 68 68 96 (!) 36  Resp: 20 16 20 18   Temp: 97.8 F (36.6 C) 98.1 F (36.7 C) 98 F (36.7 C) 97.7 F (36.5 C)  TempSrc: Oral Oral Oral Oral  SpO2: 96% 93% 96% 96%  Weight:   83.1 kg   Height:        Intake/Output Summary (Last 24 hours) at 01/23/2024 0905 Last data filed at 01/23/2024 0854 Gross per 24 hour  Intake 477 ml  Output 1300 ml  Net -823 ml      01/23/2024    4:15 AM 01/22/2024    3:29 AM 01/21/2024    3:54 AM  Last 3 Weights  Weight (lbs) 183 lb 4.8 oz 184 lb 11.2 oz 188 lb 7.9 oz  Weight (kg) 83.144 kg 83.779 kg 85.5 kg      Telemetry Sinus rhythm.  Frequent PVCs.- Personally Reviewed  ECG  01/20/24:  Atrial fibrillation.  Rate 108 bpm.  PVCs.  Cannot rule out prior anterior MI.  - Personally Reviewed 01/22/2024: Sinus rhythm.  Rate 97 bpm.  Frequent PVCs.  Incomplete left bundle branch block.  Physical Exam  VS:  BP 119/74 (BP Location: Right Arm)   Pulse (!) 36   Temp 97.7 F (36.5 C) (Oral)   Resp 18   Ht 5' 6 (1.676 m)   Wt 83.1 kg   SpO2 96%   BMI 29.59 kg/m  , BMI Body mass index is 29.59 kg/m. GENERAL:   Well appearing HEENT: Pupils equal round and reactive, fundi not visualized, oral mucosa unremarkable NECK:  No jugular venous distention, waveform within normal limits, carotid upstroke brisk and symmetric, no bruits, no thyromegaly LUNGS: Clear to auscultation bilaterally HEART: Mostly regular with occasional ectopy PMI not displaced or sustained,S1 and S2 within normal limits, no S3, no S4, no clicks, no rubs, no murmurs ABD:  Flat, positive bowel sounds normal in frequency in pitch, no bruits, no rebound, no guarding, no midline pulsatile mass, no hepatomegaly, no splenomegaly EXT:  2 plus pulses throughout, trace LE edema, no cyanosis no clubbing SKIN:  No rashes no nodules NEURO:  Cranial nerves II through XII grossly intact, motor grossly intact throughout PSYCH:  Cognitively intact, oriented to person place and time   Labs High Sensitivity Troponin:   Recent Labs  Lab 01/20/24 1149 01/20/24 1412  TROPONINIHS 18* 19*     Chemistry Recent Labs  Lab 01/20/24 1149 01/21/24 0320 01/22/24 0233 01/23/24 0229  NA 140 141 141 141  K 4.0 3.6  3.5 3.4*  CL 106 105 103 102  CO2 25 24 25 26   GLUCOSE 110* 94 105* 100*  BUN 24* 22 33* 30*  CREATININE 1.67* 1.70* 2.03* 1.87*  CALCIUM  9.3 8.8* 8.5* 8.8*  MG 1.9 1.9  --  2.1  PROT 6.4* 4.8*  --   --   ALBUMIN 3.5 3.3*  --   --   AST 20 10*  --   --   ALT 7 <5  --   --   ALKPHOS 51 47  --   --   BILITOT 1.4* 1.3*  --   --   GFRNONAA 40* 39* 32* 35*  ANIONGAP 9 12 13 13     Lipids No results for input(s): CHOL, TRIG, HDL, LABVLDL, LDLCALC, CHOLHDL in the last 168 hours.  Hematology Recent Labs  Lab 01/20/24 1149 01/21/24 0320 01/22/24 0233  WBC 10.5 11.2* 11.5*  RBC 4.51 4.48 4.77  HGB 13.3 13.1 13.9  HCT 42.0 40.6 43.0  MCV 93.1 90.6 90.1  MCH 29.5 29.2 29.1  MCHC 31.7 32.3 32.3  RDW 15.7* 15.4 15.4  PLT 159 159 163   Thyroid  No results for input(s): TSH, FREET4 in the last 168 hours.  BNP Recent  Labs  Lab 01/20/24 1149  BNP 2,227.1*    DDimer No results for input(s): DDIMER in the last 168 hours.   Radiology  ECHO TEE Result Date: 01/22/2024    TRANSESOPHOGEAL ECHO REPORT   Patient Name:   TEJON GRACIE [facility] Date of Exam: 01/22/2024 Medical Rec #:  [id number]       Height:       66.0 in Accession #:    [id number]      Weight:       184.7 lb Date of Birth:  1940-09-20        BSA:          1.933 m Patient Age:    83 years        BP:           118/91 mmHg Patient Gender: M               HR:           86 bpm. Exam Location:  Inpatient Procedure: Transesophageal Echo, Color Doppler and Cardiac Doppler (Both            Spectral and Color Flow Doppler were utilized during procedure). Indications:    Afib, cardioversion  History:        Patient has prior history of Echocardiogram examinations, most                 recent 01/21/2024.  Sonographer:    Tinnie Gosling RDCS Referring Phys: [phone] MARK C SKAINS PROCEDURE: The transesophogeal probe was passed without difficulty through the esophogus of the patient. Sedation performed by different physician. The patient was monitored while under deep sedation. The patient's vital signs; including heart rate, blood pressure, and oxygen saturation; remained stable throughout the procedure. The patient developed no complications during the procedure. A successful direct current cardioversion was performed at 200 joules with 1 attempt.  IMPRESSIONS  1. Left ventricular ejection fraction, by estimation, is 20 to 25%. The left ventricle has severely decreased function. The left ventricle has no regional wall motion abnormalities.  2. Right ventricular systolic function is normal. The right ventricular size is normal.  3. Left atrial size was mildly dilated. No left atrial/left atrial appendage thrombus was detected.  4. Right atrial  size was mildly dilated.  5. The mitral valve is normal in structure. Mild mitral valve regurgitation. No evidence of mitral stenosis.  6. The aortic  valve is tricuspid. There is mild calcification of the aortic valve. There is mild thickening of the aortic valve. Aortic valve regurgitation is not visualized. No aortic stenosis is present.  7. The inferior vena cava is normal in size with greater than 50% respiratory variability, suggesting right atrial pressure of 3 mmHg.  8. There is a small patent foramen ovale with predominantly left to right shunting across the atrial septum. FINDINGS  Left Ventricle: Left ventricular ejection fraction, by estimation, is 20 to 25%. The left ventricle has severely decreased function. The left ventricle has no regional wall motion abnormalities. The left ventricular internal cavity size was normal in size. There is no left ventricular hypertrophy. Right Ventricle: The right ventricular size is normal. No increase in right ventricular wall thickness. Right ventricular systolic function is normal. Left Atrium: Left atrial size was mildly dilated. Spontaneous echo contrast was present. No left atrial/left atrial appendage thrombus was detected. Right Atrium: Right atrial size was mildly dilated. Pericardium: There is no evidence of pericardial effusion. Mitral Valve: The mitral valve is normal in structure. Mild mitral valve regurgitation. No evidence of mitral valve stenosis. Tricuspid Valve: The tricuspid valve is normal in structure. Tricuspid valve regurgitation is mild . No evidence of tricuspid stenosis. Aortic Valve: The aortic valve is tricuspid. There is mild calcification of the aortic valve. There is mild thickening of the aortic valve. Aortic valve regurgitation is not visualized. No aortic stenosis is present. Pulmonic Valve: The pulmonic valve was normal in structure. Pulmonic valve regurgitation is not visualized. No evidence of pulmonic stenosis. Aorta: The aortic root is normal in size and structure. Venous: The inferior vena cava is normal in size with greater than 50% respiratory variability, suggesting right  atrial pressure of 3 mmHg. IAS/Shunts: No atrial level shunt detected by color flow Doppler. A small patent foramen ovale is detected with predominantly left to right shunting across the atrial septum. Oneil Parchment MD Electronically signed by Oneil Parchment MD Signature Date/Time: 01/22/2024/2:34:31 PM    Final    EP STUDY Result Date: 01/22/2024 See surgical note for result.  DG Abd 1 View Result Date: 01/21/2024 CLINICAL DATA:  Abdominal pain EXAM: ABDOMEN - 1 VIEW COMPARISON:  Abdominal x-ray 03/27/2019 FINDINGS: The bowel gas pattern is normal. There is a large amount of stool throughout the colon. There are atherosclerotic calcifications of the splenic artery. Degenerative changes affect the spine and hips. No suspicious calcifications are seen. IMPRESSION: Nonobstructive bowel gas pattern. Large amount of stool throughout the colon. Electronically Signed   By: Greig Pique M.D.   On: 01/21/2024 17:50   ECHOCARDIOGRAM COMPLETE Result Date: 01/21/2024    ECHOCARDIOGRAM REPORT   Patient Name:   KYRELL RUACHO [facility] Date of Exam: 01/21/2024 Medical Rec #:  [id number]       Height:       66.0 in Accession #:    [id number]      Weight:       188.5 lb Date of Birth:  Oct 08, 1940        BSA:          1.950 m Patient Age:    83 years        BP:           153/97 mmHg Patient Gender: M  HR:           82 bpm. Exam Location:  Inpatient Procedure: 2D Echo, Cardiac Doppler and Color Doppler (Both Spectral and Color            Flow Doppler were utilized during procedure). Indications:    I50.31 Acute diastolic (congestive) heart failure  History:        Patient has prior history of Echocardiogram examinations, most                 recent 07/02/2021. CAD, Arrythmias:Atrial Fibrillation,                 Signs/Symptoms:Parkinson's; Risk Factors:Hypertension and                 Dyslipidemia.  Sonographer:    Damien Senior RDCS Referring Phys: [id number] MARSA NOVAK MELVIN IMPRESSIONS  1. Left ventricular ejection fraction, by  estimation, is 30 to 35%. The left ventricle has moderately decreased function. The left ventricle demonstrates global hypokinesis. Left ventricular diastolic parameters are consistent with Grade I diastolic dysfunction (impaired relaxation).  2. Right ventricular systolic function is normal. The right ventricular size is normal. Mildly increased right ventricular wall thickness.  3. Left atrial size was moderately dilated.  4. Right atrial size was severely dilated.  5. A small pericardial effusion is present. Large pleural effusion in both left and right lateral regions.  6. The mitral valve is normal in structure. Moderate mitral valve regurgitation. No evidence of mitral stenosis.  7. The aortic valve is calcified. There is mild calcification of the aortic valve. There is mild thickening of the aortic valve. Aortic valve regurgitation is not visualized. Aortic valve sclerosis is present, with no evidence of aortic valve stenosis. Aortic valve mean gradient measures 4.3 mmHg. Aortic valve Vmax measures 1.30 m/s.  8. The inferior vena cava is normal in size with greater than 50% respiratory variability, suggesting right atrial pressure of 3 mmHg. FINDINGS  Left Ventricle: Left ventricular ejection fraction, by estimation, is 30 to 35%. The left ventricle has moderately decreased function. The left ventricle demonstrates global hypokinesis. The left ventricular internal cavity size was normal in size. There is no left ventricular hypertrophy. Left ventricular diastolic parameters are consistent with Grade I diastolic dysfunction (impaired relaxation). Right Ventricle: The right ventricular size is normal. Mildly increased right ventricular wall thickness. Right ventricular systolic function is normal. Left Atrium: Left atrial size was moderately dilated. Right Atrium: Right atrial size was severely dilated. Pericardium: A small pericardial effusion is present. Mitral Valve: The mitral valve is normal in structure.  Mild mitral annular calcification. Moderate mitral valve regurgitation. No evidence of mitral valve stenosis. Tricuspid Valve: The tricuspid valve is normal in structure. Tricuspid valve regurgitation is mild . No evidence of tricuspid stenosis. Aortic Valve: The aortic valve is calcified. There is mild calcification of the aortic valve. There is mild thickening of the aortic valve. Aortic valve regurgitation is not visualized. Aortic valve sclerosis is present, with no evidence of aortic valve stenosis. Aortic valve mean gradient measures 4.3 mmHg. Aortic valve peak gradient measures 6.7 mmHg. Aortic valve area, by VTI measures 1.63 cm. Pulmonic Valve: The pulmonic valve was normal in structure. Pulmonic valve regurgitation is trivial. No evidence of pulmonic stenosis. Aorta: The aortic root is normal in size and structure. Venous: The inferior vena cava is normal in size with greater than 50% respiratory variability, suggesting right atrial pressure of 3 mmHg. IAS/Shunts: No atrial level shunt detected by color flow  Doppler. Additional Comments: There is a large pleural effusion in both left and right lateral regions.  LEFT VENTRICLE PLAX 2D LVIDd:         5.70 cm LVIDs:         4.90 cm LV PW:         1.10 cm LV IVS:        1.00 cm LVOT diam:     2.30 cm LV SV:         41 LV SV Index:   21 LVOT Area:     4.15 cm  RIGHT VENTRICLE RV S prime:     9.62 cm/s TAPSE (M-mode): 2.0 cm LEFT ATRIUM              Index        RIGHT ATRIUM           Index LA diam:        4.90 cm  2.51 cm/m   RA Area:     29.70 cm LA Vol (A2C):   110.1 ml 56.46 ml/m  RA Volume:   117.00 ml 60.00 ml/m LA Vol (A4C):   80.7 ml  41.38 ml/m LA Biplane Vol: 90.7 ml  46.51 ml/m  AORTIC VALVE AV Area (Vmax):    1.68 cm AV Area (Vmean):   1.66 cm AV Area (VTI):     1.63 cm AV Vmax:           129.67 cm/s AV Vmean:          96.767 cm/s AV VTI:            0.253 m AV Peak Grad:      6.7 mmHg AV Mean Grad:      4.3 mmHg LVOT Vmax:         52.48  cm/s LVOT Vmean:        38.750 cm/s LVOT VTI:          0.099 m LVOT/AV VTI ratio: 0.39  AORTA Ao Root diam: 3.40 cm Ao Asc diam:  3.60 cm  SHUNTS Systemic VTI:  0.10 m Systemic Diam: 2.30 cm Oneil Parchment MD Electronically signed by Oneil Parchment MD Signature Date/Time: 01/21/2024/2:23:04 PM    Final     Cardiac Studies Echo 01/21/24: 1. Left ventricular ejection fraction, by estimation, is 30 to 35%. The  left ventricle has moderately decreased function. The left ventricle  demonstrates global hypokinesis. Left ventricular diastolic parameters are  consistent with Grade I diastolic  dysfunction (impaired relaxation).   2. Right ventricular systolic function is normal. The right ventricular  size is normal. Mildly increased right ventricular wall thickness.   3. Left atrial size was moderately dilated.   4. Right atrial size was severely dilated.   5. A small pericardial effusion is present. Large pleural effusion in  both left and right lateral regions.   6. The mitral valve is normal in structure. Moderate mitral valve  regurgitation. No evidence of mitral stenosis.   7. The aortic valve is calcified. There is mild calcification of the  aortic valve. There is mild thickening of the aortic valve. Aortic valve  regurgitation is not visualized. Aortic valve sclerosis is present, with  no evidence of aortic valve stenosis.  Aortic valve mean gradient measures 4.3 mmHg. Aortic valve Vmax measures  1.30 m/s.   8. The inferior vena cava is normal in size with greater than 50%  respiratory variability, suggesting right atrial pressure of 3 mmHg.    TEE  01/22/24: 1. Left ventricular ejection fraction, by estimation, is 20 to 25%. The  left ventricle has severely decreased function. The left ventricle has no  regional wall motion abnormalities.   2. Right ventricular systolic function is normal. The right ventricular  size is normal.   3. Left atrial size was mildly dilated. No left atrial/left atrial   appendage thrombus was detected.   4. Right atrial size was mildly dilated.   5. The mitral valve is normal in structure. Mild mitral valve  regurgitation. No evidence of mitral stenosis.   6. The aortic valve is tricuspid. There is mild calcification of the  aortic valve. There is mild thickening of the aortic valve. Aortic valve  regurgitation is not visualized. No aortic stenosis is present.   7. The inferior vena cava is normal in size with greater than 50%  respiratory variability, suggesting right atrial pressure of 3 mmHg.   8. There is a small patent foramen ovale with predominantly left to right  shunting across the atrial septum.   Patient Profile   83 y.o. male with hypertension, HFimpEF, CKD 3, DVT, atrial fibrillation,   Assessment & Plan   # HFrEF:  # Hypertension:  LVEF was previously reduced to 35-40%.  Subsequently improved to 50-55% on his most recent echocardiogram 06/2021.  This admission it is back to 30-35% on transthoracic echo and 20-25% on TEE.  He was successfully cardioverted, though he has a lot of ectopy and I am concerned that he has at high risk of recurrent atrial fibrillation.  We need to optimize his GDMT.  Will add Farxiga  10 mg daily.  Continue Nebivolol .  He struggles with intolerance to multiple medications including lisinopril and valsartan.  Will try low-dose irbesartan .  Discontinue clonidine  patch.  If renal function improves we will try to add an MRA.  # Atrial fibrillation:  Rate was controlled on nebivolol .  He underwent successful TEE/DCCV on 01/22/2024.  Continue Eliquis  and Nebivolol .   # CKD 3a:  Renal function improving.  Likely start oral diuretics tomorrow.  For questions or updates, please contact [facility] Please consult [email or web address] for contact info under     Signed, Annabella Scarce, MD  01/23/2024, 9:05 AM

## 2024-01-23 NOTE — Progress Notes (Signed)
 Heart Failure Navigator Progress Note  Assessed for Heart & Vascular TOC clinic readiness.  Patient does not meet criteria due to New Atrial Fibrillation, No HF TOC per Dr. Noralee. Patient to follow up with Dr. Ladona with [facility]. .   Navigator will sign off at this time.   Stephane Haddock, BSN, Scientist, clinical (histocompatibility and immunogenetics) Only

## 2024-01-23 NOTE — Progress Notes (Signed)
 Physical Therapy Treatment Patient Details Name: Keith [name] MRN: [id number] DOB: 1940/09/27 Today's Date: 01/23/2024   History of Present Illness Pt is a 83 y.o. male admitted 9/1 for SOB and abdominal pain. Pt found to be in afib, and chest x-ray showed small bilateral effusions and bibasilar atelectasis. Admitted for management of CHF exacerbation and afib. S/p cardioversion 9/3. PMH: HTN, CHF, CKD, DVT, GERD, afib (new onset 12/19/23, had originally scheduled cardioversion but family canceled), chronic L shoulder injury, and Parkinson's.    PT Comments  Pt received in supine, agreeable to therapy session with encouragement, pt self-limiting due to desire to watch TV although denies fatigue. Pt needing up to minA for sit<>stand wtihout use of arms and for stepping up/down smaller 4 platform step with RW support for BLE strengthening. Pt without LE buckling today but requiring seated break after standing exercise sets. Decreased insight into deficits and pt remains high fall risk due to this and reliant on RW. Pt continues to benefit from PT services to progress toward functional mobility goals.    If plan is discharge home, recommend the following: A little help with walking and/or transfers;A little help with bathing/dressing/bathroom;Assistance with cooking/housework;Assist for transportation;Help with stairs or ramp for entrance;Direct supervision/assist for medications management   Can travel by private vehicle        Equipment Recommendations  Wheelchair (measurements PT);Wheelchair cushion (measurements PT);BSC/3in1    Recommendations for Other Services       Precautions / Restrictions Precautions Precautions: Fall Recall of Precautions/Restrictions: Intact Restrictions Weight Bearing Restrictions Per Provider Order: No     Mobility  Bed Mobility Overal bed mobility: Needs Assistance Bed Mobility: Rolling, Sidelying to Sit, Sit to Sidelying Rolling: Contact guard assist,  Used rails Sidelying to sit: Min assist, HOB elevated, Used rails     Sit to sidelying: Min assist General bed mobility comments: cues for sequencing/safety as pt requesting assist from PTA before trying on his own; log roll for ease of technique to build more independence. LE assist returning to supine    Transfers Overall transfer level: Needs assistance Equipment used: Rolling walker (2 wheels), None Transfers: Sit to/from Stand Sit to Stand: Min assist, Contact guard assist           General transfer comment: minA initially, progressed to CGA with reps. STS x5 wtih arms crossed at chest wtih CGA to minA and at times BLE resting heavily on bed frame for posterior stability.    Ambulation/Gait Ambulation/Gait assistance: Min assist, Contact guard assist Gait Distance (Feet): 10 Feet Assistive device: Rolling walker (2 wheels) Gait Pattern/deviations: Decreased stride length, Step-to pattern, Shuffle Gait velocity: decreased     General Gait Details: pt self-limiting to stepping at bedside to/from platform step with stair training, seated break, then a few feet forward/backward and lateral toward HOB, then requesting return to supine.   Stairs Stairs: Yes Stairs assistance: Min assist Stair Management: Step to pattern, Forwards, Backwards, Two rails (RW to simulate rails) Number of Stairs: 10 (4 platform step in room x10 reps) General stair comments: pt ascended/descended small 4 platform step in room x10 reps x 2 sets with seated rest break between. Pt states only a little fatigue after, then states no fatigue but refusing gait trial in hallway or around room after these stairs   Wheelchair Mobility     Tilt Bed    Modified Rankin (Stroke Patients Only)       Balance Overall balance assessment: Needs assistance Sitting-balance support: No upper  extremity supported, Feet supported Sitting balance-Leahy Scale: Fair     Standing balance support: Bilateral  upper extremity supported, During functional activity, Reliant on assistive device for balance Standing balance-Leahy Scale: Poor Standing balance comment: Reliant on RW                            Communication Communication Communication: No apparent difficulties  Cognition Arousal: Alert Behavior During Therapy: WFL for tasks assessed/performed   PT - Cognitive impairments: Memory, Attention, Awareness, Problem solving, Safety/Judgement                       PT - Cognition Comments: Pt reports no fatigue although appears more fatigued than this. Pt with poor awareness of deficits and limitations, states I've been in bed for a week, I need to move around then when working with PTA, pt keeps asking to return to bed to watch TV. Following commands: Intact      Cueing Cueing Techniques: Verbal cues, Gestural cues  Exercises Other Exercises Other Exercises: sit-stand x5 with arms crossed at chest from EOB Other Exercises: platform stepping up/down x10 reps (small step) x2 sets with seated break; BUE supported    General Comments General comments (skin integrity, edema, etc.): HR 50's-61 bpm; BP 111/82 seated EOB; SpO2 99% on RA      Pertinent Vitals/Pain Pain Assessment Pain Assessment: Faces Faces Pain Scale: Hurts a little bit Pain Location: L shoulder, pt reports it is chronic Pain Descriptors / Indicators: Discomfort, Guarding Pain Intervention(s): Monitored during session, Repositioned    Home Living                          Prior Function            PT Goals (current goals can now be found in the care plan section) Acute Rehab PT Goals Patient Stated Goal: to get stronger PT Goal Formulation: With patient Time For Goal Achievement: 02/05/24 Progress towards PT goals: Progressing toward goals    Frequency    Min 2X/week      PT Plan      Co-evaluation              AM-PAC PT 6 Clicks Mobility   Outcome  Measure  Help needed turning from your back to your side while in a flat bed without using bedrails?: A Little Help needed moving from lying on your back to sitting on the side of a flat bed without using bedrails?: A Lot (w/o rail) Help needed moving to and from a bed to a chair (including a wheelchair)?: A Little Help needed standing up from a chair using your arms (e.g., wheelchair or bedside chair)?: A Little Help needed to walk in hospital room?: A Lot (based on progress today; rec chair follow) Help needed climbing 3-5 steps with a railing? : A Little 6 Click Score: 16    End of Session Equipment Utilized During Treatment: Gait belt Activity Tolerance: Patient limited by fatigue Patient left: with call bell/phone within reach;in bed;with bed alarm set Nurse Communication: Mobility status PT Visit Diagnosis: Unsteadiness on feet (R26.81);Muscle weakness (generalized) (M62.81)     Time: 8443-8386 PT Time Calculation (min) (ACUTE ONLY): 17 min  Charges:    $Therapeutic Exercise: 8-22 mins PT General Charges $$ ACUTE PT VISIT: 1 Visit  Connell SQUIBB., PTA Acute Rehabilitation Services Secure Chat Preferred 9a-5:30pm Office: [phone]    Connell HERO [facility] 01/23/2024, 4:47 PM

## 2024-01-23 NOTE — Plan of Care (Signed)

## 2024-01-24 ENCOUNTER — Other Ambulatory Visit (HOSPITAL_COMMUNITY): Payer: Self-pay

## 2024-01-24 ENCOUNTER — Telehealth (HOSPITAL_COMMUNITY): Payer: Self-pay | Admitting: Pharmacy Technician

## 2024-01-24 DIAGNOSIS — I4891 Unspecified atrial fibrillation: Secondary | ICD-10-CM | POA: Diagnosis not present

## 2024-01-24 DIAGNOSIS — I5023 Acute on chronic systolic (congestive) heart failure: Secondary | ICD-10-CM | POA: Diagnosis not present

## 2024-01-24 DIAGNOSIS — I480 Paroxysmal atrial fibrillation: Secondary | ICD-10-CM | POA: Diagnosis not present

## 2024-01-24 DIAGNOSIS — I10 Essential (primary) hypertension: Secondary | ICD-10-CM | POA: Diagnosis not present

## 2024-01-24 DIAGNOSIS — N1832 Chronic kidney disease, stage 3b: Secondary | ICD-10-CM | POA: Diagnosis not present

## 2024-01-24 LAB — BASIC METABOLIC PANEL WITH GFR
Anion gap: 10 (ref 5–15)
BUN: 28 mg/dL — ABNORMAL HIGH (ref 8–23)
CO2: 26 mmol/L (ref 22–32)
Calcium: 8.6 mg/dL — ABNORMAL LOW (ref 8.9–10.3)
Chloride: 106 mmol/L (ref 98–111)
Creatinine, Ser: 1.79 mg/dL — ABNORMAL HIGH (ref 0.61–1.24)
GFR, Estimated: 37 mL/min — ABNORMAL LOW (ref >60)
Glucose, Bld: 94 mg/dL (ref 70–99)
Potassium: 4.3 mmol/L (ref 3.5–5.1)
Sodium: 142 mmol/L (ref 135–145)

## 2024-01-24 LAB — MAGNESIUM: Magnesium: 2.1 mg/dL (ref 1.7–2.4)

## 2024-01-24 MED ORDER — EMPAGLIFLOZIN 10 MG PO TABS
10.0000 mg | ORAL_TABLET | Freq: Every day | ORAL | 0 refills | Status: DC
  Filled 2024-01-24: qty 30, 30d supply, fill #0

## 2024-01-24 MED ORDER — BUMETANIDE 0.5 MG PO TABS: 0.5000 mg | ORAL_TABLET | ORAL | Status: DC

## 2024-01-24 MED ORDER — NEBIVOLOL HCL 20 MG PO TABS
20.0000 mg | ORAL_TABLET | Freq: Every day | ORAL | 0 refills | Status: DC
  Filled 2024-01-24: qty 30, 30d supply, fill #0

## 2024-01-24 MED ORDER — DAPAGLIFLOZIN PROPANEDIOL 10 MG PO TABS
10.0000 mg | ORAL_TABLET | Freq: Every day | ORAL | Status: DC
  Administered 2024-01-24: 10 mg via ORAL
  Filled 2024-01-24: qty 1

## 2024-01-24 MED ORDER — POLYETHYLENE GLYCOL 3350 17 GM/SCOOP PO POWD
17.0000 g | Freq: Every day | ORAL | 0 refills | Status: AC
  Filled 2024-01-24: qty 238, 14d supply, fill #0

## 2024-01-24 MED ORDER — BUMETANIDE 0.5 MG PO TABS: 0.5000 mg | ORAL_TABLET | Freq: Every day | ORAL | Status: DC

## 2024-01-24 MED ORDER — IRBESARTAN 75 MG PO TABS
75.0000 mg | ORAL_TABLET | Freq: Every day | ORAL | 0 refills | Status: DC
  Filled 2024-01-24: qty 30, 30d supply, fill #0

## 2024-01-24 MED ORDER — EMPAGLIFLOZIN 10 MG PO TABS: 10.0000 mg | ORAL_TABLET | Freq: Every day | ORAL | Status: DC

## 2024-01-24 MED ORDER — APIXABAN 2.5 MG PO TABS
2.5000 mg | ORAL_TABLET | Freq: Two times a day (BID) | ORAL | 0 refills | Status: DC
Start: 2024-01-24 — End: 2024-04-03
  Filled 2024-01-24: qty 60, 30d supply, fill #0

## 2024-01-24 NOTE — Progress Notes (Signed)
 Rounding Note   Patient Name: Keith [name] Date of Encounter: 01/24/2024  [facility] Cardiologist: Gordy Bergamo, MD   Subjective Feeling well.  Denies any shortness of breath.  Ambulated without difficulty.  Scheduled Meds:  apixaban   2.5 mg Oral BID   atorvastatin   10 mg Oral Daily   carbidopa -levodopa   1 tablet Oral TID AC   dapagliflozin  propanediol  10 mg Oral Daily   finasteride   5 mg Oral Daily   irbesartan   75 mg Oral Daily   lactulose   20 g Oral TID   nebivolol   20 mg Oral Daily   pantoprazole   40 mg Oral Daily   sodium chloride  flush  3 mL Intravenous Q12H   tamsulosin   0.4 mg Oral Daily   Continuous Infusions:   PRN Meds: acetaminophen  **OR** acetaminophen , phenol, polyethylene glycol   Vital Signs  Vitals:   01/23/24 2353 01/24/24 0343 01/24/24 0720 01/24/24 0903  BP: 121/82 122/69 121/63   Pulse: 61 60 (!) 59   Resp: 20 20 18 18   Temp: 98 F (36.7 C) 98.1 F (36.7 C) 97.6 F (36.4 C)   TempSrc: Oral Oral Oral   SpO2: 94% 93% 96%   Weight:  83.2 kg    Height:        Intake/Output Summary (Last 24 hours) at 01/24/2024 0928 Last data filed at 01/24/2024 0828 Gross per 24 hour  Intake 957 ml  Output 1150 ml  Net -193 ml      01/24/2024    3:43 AM 01/23/2024    4:15 AM 01/22/2024    3:29 AM  Last 3 Weights  Weight (lbs) 183 lb 6.8 oz 183 lb 4.8 oz 184 lb 11.2 oz  Weight (kg) 83.2 kg 83.144 kg 83.779 kg      Telemetry Sinus rhythm.  Frequent PVCs.- Personally Reviewed  ECG  01/20/24:  Atrial fibrillation.  Rate 108 bpm.  PVCs.  Cannot rule out prior anterior MI.  - Personally Reviewed 01/22/2024: Sinus rhythm.  Rate 97 bpm.  Frequent PVCs.  Incomplete left bundle branch block.  Physical Exam  VS:  BP 121/63 (BP Location: Right Arm)   Pulse (!) 59   Temp 97.6 F (36.4 C) (Oral)   Resp 18   Ht 5' 6 (1.676 m)   Wt 83.2 kg   SpO2 96%   BMI 29.61 kg/m  , BMI Body mass index is 29.61 kg/m. GENERAL:  Well appearing HEENT: Pupils equal  round and reactive, fundi not visualized, oral mucosa unremarkable NECK:  No jugular venous distention, waveform within normal limits, carotid upstroke brisk and symmetric, no bruits, no thyromegaly LUNGS: Clear to auscultation bilaterally HEART: RRR.  PMI not displaced or sustained,S1 and S2 within normal limits, no S3, no S4, no clicks, no rubs, no murmurs ABD:  Flat, positive bowel sounds normal in frequency in pitch, no bruits, no rebound, no guarding, no midline pulsatile mass, no hepatomegaly, no splenomegaly EXT:  2 plus pulses throughout, trace LE edema, no cyanosis no clubbing SKIN:  No rashes no nodules NEURO:  Cranial nerves II through XII grossly intact, motor grossly intact throughout PSYCH:  Cognitively intact, oriented to person place and time   Labs High Sensitivity Troponin:   Recent Labs  Lab 01/20/24 1149 01/20/24 1412  TROPONINIHS 18* 19*     Chemistry Recent Labs  Lab 01/20/24 1149 01/21/24 0320 01/22/24 0233 01/23/24 0229 01/24/24 0643  NA 140 141 141 141 142  K 4.0 3.6 3.5 3.4* 4.3  CL 106 105 103 102 106  CO2 25 24 25 26 26   GLUCOSE 110* 94 105* 100* 94  BUN 24* 22 33* 30* 28*  CREATININE 1.67* 1.70* 2.03* 1.87* 1.79*  CALCIUM  9.3 8.8* 8.5* 8.8* 8.6*  MG 1.9 1.9  --  2.1 2.1  PROT 6.4* 4.8*  --   --   --   ALBUMIN 3.5 3.3*  --   --   --   AST 20 10*  --   --   --   ALT 7 <5  --   --   --   ALKPHOS 51 47  --   --   --   BILITOT 1.4* 1.3*  --   --   --   GFRNONAA 40* 39* 32* 35* 37*  ANIONGAP 9 12 13 13 10     Lipids No results for input(s): CHOL, TRIG, HDL, LABVLDL, LDLCALC, CHOLHDL in the last 168 hours.  Hematology Recent Labs  Lab 01/20/24 1149 01/21/24 0320 01/22/24 0233  WBC 10.5 11.2* 11.5*  RBC 4.51 4.48 4.77  HGB 13.3 13.1 13.9  HCT 42.0 40.6 43.0  MCV 93.1 90.6 90.1  MCH 29.5 29.2 29.1  MCHC 31.7 32.3 32.3  RDW 15.7* 15.4 15.4  PLT 159 159 163   Thyroid  No results for input(s): TSH, FREET4 in the last 168  hours.  BNP Recent Labs  Lab 01/20/24 1149  BNP 2,227.1*    DDimer No results for input(s): DDIMER in the last 168 hours.   Radiology  ECHO TEE Result Date: 01/22/2024    TRANSESOPHOGEAL ECHO REPORT   Patient Name:   Keith [name] Keith [name] Date of Exam: 01/22/2024 Medical Rec #:  [id number]       Height:       66.0 in Accession #:    [id number]      Weight:       184.7 lb Date of Birth:  [birth year]        BSA:          1.933 m Patient Age:    83 years        BP:           118/91 mmHg Patient Gender: M               HR:           86 bpm. Exam Location:  Inpatient Procedure: Transesophageal Echo, Color Doppler and Cardiac Doppler (Both            Spectral and Color Flow Doppler were utilized during procedure). Indications:    Afib, cardioversion  History:        Patient has prior history of Echocardiogram examinations, most                 recent 01/21/2024.  Sonographer:    Keith [name] RDCS Referring Phys: [phone] Keith [name] PROCEDURE: The transesophogeal probe was passed without difficulty through the esophogus of the patient. Sedation performed by different physician. The patient was monitored while under deep sedation. The patient's vital signs; including heart rate, blood pressure, and oxygen saturation; remained stable throughout the procedure. The patient developed no complications during the procedure. A successful direct current cardioversion was performed at 200 joules with 1 attempt.  IMPRESSIONS  1. Left ventricular ejection fraction, by estimation, is 20 to 25%. The left ventricle has severely decreased function. The left ventricle has no regional wall motion abnormalities.  2. Right ventricular systolic function is normal. The right  ventricular size is normal.  3. Left atrial size was mildly dilated. No left atrial/left atrial appendage thrombus was detected.  4. Right atrial size was mildly dilated.  5. The mitral valve is normal in structure. Mild mitral valve regurgitation. No evidence of mitral  stenosis.  6. The aortic valve is tricuspid. There is mild calcification of the aortic valve. There is mild thickening of the aortic valve. Aortic valve regurgitation is not visualized. No aortic stenosis is present.  7. The inferior vena cava is normal in size with greater than 50% respiratory variability, suggesting right atrial pressure of 3 mmHg.  8. There is a small patent foramen ovale with predominantly left to right shunting across the atrial septum. FINDINGS  Left Ventricle: Left ventricular ejection fraction, by estimation, is 20 to 25%. The left ventricle has severely decreased function. The left ventricle has no regional wall motion abnormalities. The left ventricular internal cavity size was normal in size. There is no left ventricular hypertrophy. Right Ventricle: The right ventricular size is normal. No increase in right ventricular wall thickness. Right ventricular systolic function is normal. Left Atrium: Left atrial size was mildly dilated. Spontaneous echo contrast was present. No left atrial/left atrial appendage thrombus was detected. Right Atrium: Right atrial size was mildly dilated. Pericardium: There is no evidence of pericardial effusion. Mitral Valve: The mitral valve is normal in structure. Mild mitral valve regurgitation. No evidence of mitral valve stenosis. Tricuspid Valve: The tricuspid valve is normal in structure. Tricuspid valve regurgitation is mild . No evidence of tricuspid stenosis. Aortic Valve: The aortic valve is tricuspid. There is mild calcification of the aortic valve. There is mild thickening of the aortic valve. Aortic valve regurgitation is not visualized. No aortic stenosis is present. Pulmonic Valve: The pulmonic valve was normal in structure. Pulmonic valve regurgitation is not visualized. No evidence of pulmonic stenosis. Aorta: The aortic root is normal in size and structure. Venous: The inferior vena cava is normal in size with greater than 50% respiratory  variability, suggesting right atrial pressure of 3 mmHg. IAS/Shunts: No atrial level shunt detected by color flow Doppler. A small patent foramen ovale is detected with predominantly left to right shunting across the atrial septum. Oneil Parchment MD Electronically signed by Oneil Parchment MD Signature Date/Time: 01/22/2024/2:34:31 PM    Final    EP STUDY Result Date: 01/22/2024 See surgical note for result.   Cardiac Studies Echo 01/21/24: 1. Left ventricular ejection fraction, by estimation, is 30 to 35%. The  left ventricle has moderately decreased function. The left ventricle  demonstrates global hypokinesis. Left ventricular diastolic parameters are  consistent with Grade I diastolic  dysfunction (impaired relaxation).   2. Right ventricular systolic function is normal. The right ventricular  size is normal. Mildly increased right ventricular wall thickness.   3. Left atrial size was moderately dilated.   4. Right atrial size was severely dilated.   5. A small pericardial effusion is present. Large pleural effusion in  both left and right lateral regions.   6. The mitral valve is normal in structure. Moderate mitral valve  regurgitation. No evidence of mitral stenosis.   7. The aortic valve is calcified. There is mild calcification of the  aortic valve. There is mild thickening of the aortic valve. Aortic valve  regurgitation is not visualized. Aortic valve sclerosis is present, with  no evidence of aortic valve stenosis.  Aortic valve mean gradient measures 4.3 mmHg. Aortic valve Vmax measures  1.30 m/s.   8.  The inferior vena cava is normal in size with greater than 50%  respiratory variability, suggesting right atrial pressure of 3 mmHg.    TEE 01/22/24: 1. Left ventricular ejection fraction, by estimation, is 20 to 25%. The  left ventricle has severely decreased function. The left ventricle has no  regional wall motion abnormalities.   2. Right ventricular systolic function is normal.  The right ventricular  size is normal.   3. Left atrial size was mildly dilated. No left atrial/left atrial  appendage thrombus was detected.   4. Right atrial size was mildly dilated.   5. The mitral valve is normal in structure. Mild mitral valve  regurgitation. No evidence of mitral stenosis.   6. The aortic valve is tricuspid. There is mild calcification of the  aortic valve. There is mild thickening of the aortic valve. Aortic valve  regurgitation is not visualized. No aortic stenosis is present.   7. The inferior vena cava is normal in size with greater than 50%  respiratory variability, suggesting right atrial pressure of 3 mmHg.   8. There is a small patent foramen ovale with predominantly left to right  shunting across the atrial septum.   Patient Profile   83 y.o. male with hypertension, HFimpEF, CKD 3, DVT, atrial fibrillation,   Assessment & Plan   # HFrEF:  # Hypertension:  LVEF was previously reduced to 35-40%.  Subsequently improved to 50-55% on his most recent echocardiogram 06/2021.  This admission it is back to 30-35% on transthoracic echo and 20-25% on TEE.  He was successfully cardioverted, though he has a lot of ectopy.  There seems to be less than immediately after DCCV.   Added Farxiga  10 mg daily.  Continue Nebivolol .  He struggles with intolerance to multiple medications including lisinopril and valsartan.  Started low-dose irbesartan  and he is feeling well.  We discontinued his clonidine  patch.  BP stable thus far.  If renal function improves we will try to add an MRA.  Resume home Bumex  0.5mg  daily tomorrow.  Check BMP in  a week.  Repeat echo in 3 months.   # Atrial fibrillation:  Rate was controlled on nebivolol .  He underwent successful TEE/DCCV on 01/22/2024.  Continue Eliquis  and Nebivolol .   # CKD 3a:  Renal function improving.  Likely start oral diuretics tomorrow.  [facility] will sign off.   Medication Recommendations:  Start Farxiga  today.   Bumex  0.5mg  daily tomorrow.  Other recommendations (labs, testing, etc):  BMP in 1 week.  Echo in 3 months.  Follow up as an outpatient:  scheduled    For questions or updates, please contact [facility] Please consult [email or web address] for contact info under     Signed, Annabella Scarce, MD  01/24/2024, 9:28 AM

## 2024-01-24 NOTE — Progress Notes (Signed)
 Mobility Specialist Progress Note:    01/24/24 1058  Mobility  Activity Stood at bedside;Pivoted/transferred from bed to chair  Level of Assistance Other (Comment) (HHA)  Assistive Device None  Distance Ambulated (ft) 25 ft  Activity Response Tolerated well  Mobility Referral Yes  Mobility visit 1 Mobility  Mobility Specialist Start Time (ACUTE ONLY) 1058  Mobility Specialist Stop Time (ACUTE ONLY) 1109  Mobility Specialist Time Calculation (min) (ACUTE ONLY) 11 min   Pt pleasant originally not agreeable to session d/t news received about wife but changed their mind shortly after. Pt needing CGA to sup> sit and sit>stand. Ambulating w/ SV. Left pt in recliner w/ all needs met.   Venetia Keel Mobility Specialist Please Neurosurgeon or Rehab Office at [phone]

## 2024-01-24 NOTE — TOC CM/SW Note (Signed)
 Transition of Care [facility]) - Inpatient Brief Assessment   Patient Details  Name: Keith [name] MRN: [id number] Date of Birth: 05-Aug-1940  [facility] CM/SW Contact:    Waddell Barnie Rama, RN Phone Number: 01/24/2024, 12:34 PM   Clinical Narrative: From home with spouse, has PCP and insurance on file, states has no HH services in place at this time , has a walker at home.  States family member will transport them home at [employer] and family is support system, states gets medications from [facility] on [address]  Pta self ambulatory with walker.  Per PT/OT eval rec HHPT, HHOT,  NCM offered choice, patient and  DIL at bedside chose [facility], since they are already working with his wife.  NCM made referral to [facility] with [facility], she states she can take referral but they are two weeks out with OT, she wanted to know if patient would rather have an HHAIDE instead of OT.  DIL states yes they would want HHAIDE instead.  NCM offered choice for DME they did not have a preference,  NCM made referral to Jermaine with [employer] for bsc and w/chair.    Transition of Care Asessment: Insurance and Status: Insurance coverage has been reviewed Patient has primary care physician: Yes Home environment has been reviewed: home with wife Prior level of function:: ambulatory with walker Prior/Current Home Services: Current home services (walker,) Social Drivers of Health Review: SDOH reviewed no interventions necessary Readmission risk has been reviewed: Yes Transition of care needs: transition of care needs identified, TOC will continue to follow

## 2024-01-24 NOTE — TOC Transition Note (Signed)
 Transition of Care [facility]) - Discharge Note   Patient Details  Name: Keith [name] MRN: [id number] Date of Birth: 1940-08-17  Transition [facility] Care [facility]) CM/SW Contact:  Waddell Barnie Rama, RN Phone Number: 01/24/2024, 12:38 PM   Clinical Narrative:    For dc today, he is set up with [facility] and [employer] to deliver DME to the bedside prior to dc.          Patient Goals and CMS Choice            Discharge Placement                       Discharge Plan and Services Additional resources added to the After Visit Summary for                                       Social Drivers of Health (SDOH) Interventions SDOH Screenings   Food Insecurity: No Food Insecurity (01/20/2024)  Housing: Low Risk  (01/20/2024)  Transportation Needs: No Transportation Needs (01/20/2024)  Utilities: Not At Risk (01/20/2024)  Social Connections: Socially Integrated (01/20/2024)  Tobacco Use: Low Risk  (01/20/2024)     Readmission Risk Interventions    01/24/2024   12:32 PM  Readmission Risk Prevention Plan  Post Dischage Appt Complete  Medication Screening Complete  Transportation Screening Complete

## 2024-01-24 NOTE — Care Management Important Message (Signed)
 Important Message  Patient Details  Name: Keith [name] MRN: [id number] Date of Birth: [birth year]   Important Message Given:  Yes - Medicare IM     Vonzell Arrie Sharps 01/24/2024, 12:21 PM

## 2024-01-24 NOTE — Plan of Care (Signed)

## 2024-01-24 NOTE — Discharge Summary (Addendum)
 Physician Discharge Summary   Patient: Keith [name] MRN: [id number] DOB: Feb 06, 1941  Admit date:     01/20/2024  Discharge date: 01/24/24  Discharge Physician: Elidia Sieving Shaft Corigliano   PCP: Gerome Brunet, DO   Recommendations at discharge:    Patient will continue nebivolol  for B blocker and anticoagulation with apixavban.  Clonidine  patch discontinued and patient placed on irbesartan  Heart failure guideline directed medical therapy with ARB, SGLT 2 inh and B blocker. Holding on mineralocorticoid receptor blocker until renal function more stable.  Resume loop diuretic with bumetanide  0,5 mg on 01/25/24 Follow up renal function and electrolytes in 7 days as outpatient Follow up echocardiogram in 3 months.  Follow up with Dr Gerome in 7 to 10 days Follow up with Cardiology as scheduled.   I spoke with patient's daughter at the bedside, we talked in detail about patient's condition, plan of care and prognosis and all questions were addressed.   Discharge Diagnoses: Principal Problem:   Acute on chronic systolic CHF (congestive heart failure) (HCC) Active Problems:   Paroxysmal atrial fibrillation (HCC)   Essential hypertension   Chronic kidney disease, stage 3b (HCC)   Atherosclerotic heart disease of native coronary artery without angina pectoris   GERD   Gout   Benign prostatic hyperplasia   Parkinsonism (HCC)   Acute on chronic congestive heart failure (HCC)   Atrial fibrillation with RVR (HCC)  Resolved Problems:   * No resolved hospital problems. [facility] Course: Mr. Lacomb was admitted to the hospital with the working diagnosis of heart failure exacerbation, complicated with atrial fibrillation.   83 yo male with the past medical history of heart failure, CKD 3a, and DVT,  12/19/23 diagnosed with A-fib over a month prior to admission. Prescribed apixaban  and recommended to have outpatient cardioversion, family canceled this and wanted a second opinion. He has been  not adherent to his medical therapy. On the day of admission he called EMS due to severe dyspnea and abdominal pain. He was found in atrial fibrillation, received 500 cc IV NS bolus and was transported to the ED.  On his initial physical examination his blood pressure was 142/92, HR 101, RR 23 and 02 saturation 92%,  Lungs with bilateral rales with no wheezing, heart with S1 and S2 present, irregular with no gallops, abdomen with no distention and positive lower extremity edema.   Na 140 K 4.0 Cl 106 bicarbonate 25 glucose 110, bun 24 cr 1,67  Mg 1,9  AST 20 ALT 7  BNP 2,227  High sensitive troponin 18  Wbc 10.5 hgb 13.3 plt 159   Urine analysis SG 1,015 negative protein, negative leukocytes and negative hgb.   Chest radiograph with cardiomegaly, with bilateral hilar vascular congestion, bilateral central interstitial infiltrates with bilateral pleural effusions.   Left shoulder radiograph with no acute fracture. Superior subluxation of the humeral head and remolding of the undersurface of the acromion likely reflecting chronic rotator cuff pathology.   Abdominal radiograph with normal gas pattern, large amount of stool throughout the colon.   EKG 108 bpm, normal axis, normal intervals, qtc 464, atrial fibrillation rhythm with multiple multifocal PVC, no significant ST segment or T wave changes.   Patient had furosemide  for diuresis with improvement in volume status.   09/03 successful direct current cardioversion.  09/05 continue sinus rhythm, plan to follow up as outpatient.   Assessment and Plan: * Acute on chronic systolic CHF (congestive heart failure) (HCC) Echocardiogram with reduced LV systolic function with EF  30 to 35%, global hypokinesis, RV systolic function preserved, with mild increased wall thickness, LA with moderate dilatation and RA with severe dilatation, small pericardial effusion, moderate mitral regurgitation   Patient was placed on IV furosemide  for diuresis,  negative fluid balance was achieved, - 6,228 ml, with significant improvement in his symptoms.   Plan to continue medical therapy with nebivolol  20 mg daily and SGLT 2 inh.  Irbersartan 75 mg daily for RAAS inhibition. Possible addition of mineralocorticoid receptor blocker as outpatient if renal function more stable.  Resume loop diuretic with bumetanide  0,5 mg po daily.   Paroxysmal atrial fibrillation (HCC) 09/03 direct current cardioversion. On Nebivolol  for B blockade.   Telemetry has been sinus bradycardia with PAC and PVC.  Continue anticoagulation with apixaban .   Essential hypertension Continue blood pressure control with nebivolol  and irbesartan  Discontinue clonidine  patch.   Chronic kidney disease, stage 3b (HCC) Hypokalemia AKI  Volume status has improved, at the time of discharge his renal function has a serum cr of 1,75 with K at 4,3 and serum bicarbonate at 26  Na 142 and Mg 2.1   Plan to follow up renal function and electrolytes as outpatient.  Contnue ARB and SGLT 2 inh.   Loop diuretic with bumetanide  0,5 mg po daily.   Atherosclerotic heart disease of native coronary artery without angina pectoris Dyslipidemia.  No chest pain, no acute coronary syndrome.  Continue statin    GERD Continue pantoprazole .   Gout No acute flare.   Benign prostatic hyperplasia No signs of urinary retention  Continue with tamsulosin  and finesteride.   Parkinsonism (HCC) Continue with sinemet .  PT and OT          Consultants: cardiology  Procedures performed: direct current cardioversion   Disposition: Home Diet recommendation:  Cardiac diet DISCHARGE MEDICATION: Allergies as of 01/24/2024       Reactions   Amlodipine Swelling   Amoxicillin-pot Clavulanate Swelling   Other reaction(s): swelling of the neck ad throat   Cefuroxime Swelling, Rash   Other reaction(s): facial swelling   Labetalol Swelling   Swollen lips    Lisinopril Cough   Penicillins Rash    Procaine Hcl Hypertension   Valsartan Other (See Comments)   Other reaction(s): cough   Azithromycin    Other reaction(s): Unknown   Benzonatate    Other reaction(s): Unknown   Allopurinol Rash   Codeine Itching, Rash   Epinephrine Palpitations   Prednisone Rash        Medication List     STOP taking these medications    allopurinol 100 MG tablet Commonly known as: ZYLOPRIM   cloNIDine  0.1 mg/24hr patch Commonly known as: CATAPRES  - Dosed in mg/24 hr   HYDROcodone -acetaminophen  10-325 MG tablet Commonly known as: NORCO       TAKE these medications    acetaminophen  500 MG tablet Commonly known as: TYLENOL  Take 1,000 mg by mouth every 8 (eight) hours as needed for mild pain (pain score 1-3).   apixaban  2.5 MG Tabs tablet Commonly known as: ELIQUIS  Take 1 tablet (2.5 mg total) by mouth 2 (two) times daily.   atorvastatin  10 MG tablet Commonly known as: LIPITOR TAKE 1 TABLET(10 MG) BY MOUTH DAILY   bumetanide  0.5 MG tablet Commonly known as: Bumex  Take 1 tablet (0.5 mg total) by mouth every morning. Start taking on: January 25, 2024   carbidopa -levodopa  25-100 MG tablet Commonly known as: SINEMET  IR Take 1 tablet by mouth 3 (three) times daily before meals.  carboxymethylcellulose 0.5 % Soln Commonly known as: REFRESH PLUS Place 1 drop into both eyes 3 (three) times daily as needed (dry eye).   empagliflozin  10 MG Tabs tablet Commonly known as: JARDIANCE  Take 1 tablet (10 mg total) by mouth daily. Start taking on: January 25, 2024   irbesartan  75 MG tablet Commonly known as: AVAPRO  Take 1 tablet (75 mg total) by mouth daily. Start taking on: January 25, 2024   meclizine 25 MG tablet Commonly known as: ANTIVERT Take 25 mg by mouth daily as needed for dizziness.   Nebivolol  HCl 20 MG Tabs Commonly known as: Bystolic  Take 1 tablet (20 mg total) by mouth daily.   pantoprazole  40 MG tablet Commonly known as: PROTONIX  Take 40 mg by mouth  daily.   polyethylene glycol 17 g packet Commonly known as: MIRALAX  / GLYCOLAX  Take 17 g by mouth daily.   PRESCRIPTION MEDICATION 1 Dose by Intravitreal route every 6 (six) weeks. Medication unknown, Performed by Dr Govind, Kishan at [facility].   sodium chloride  0.65 % Soln nasal spray Commonly known as: OCEAN Place 1 spray into both nostrils as needed for congestion.   tamsulosin  0.4 MG Caps capsule Commonly known as: FLOMAX  Take 0.4 mg by mouth daily.        Follow-up Information     Gerome Brunet, DO Follow up.   Specialty: Family Medicine Why: OFFICE WILL CALL PATIENT FOR APPOINTMENT Contact information: [address] [place] [address] [place] [address] [phone]                Discharge Exam: Fredricka Weights   01/22/24 0329 01/23/24 0415 01/24/24 0343  Weight: 83.8 kg 83.1 kg 83.2 kg   BP 104/76 (BP Location: Right Arm)   Pulse 65   Temp (!) 97.4 F (36.3 C) (Oral)   Resp 19   Ht 5' 6 (1.676 m)   Wt 83.2 kg   SpO2 97%   BMI 29.61 kg/m   Neurology awake and alert ENT with mild pallor with no icterus Cardiovascular with S1 and S2 present and regular with no gallops, or rubs, positive systolic murmur at the apex, positive extra beats.  Respiratory with no rales or wheezing, no rhonchi  Abdomen with no distention  No lower extremity edema   Condition at discharge: stable  The results of significant diagnostics from this hospitalization (including imaging, microbiology, ancillary and laboratory) are listed below for reference.   Imaging Studies: ECHO TEE Result Date: 01/22/2024    TRANSESOPHOGEAL ECHO REPORT   Patient Name:   NASH BOLLS [facility] Date of Exam: 01/22/2024 Medical Rec #:  [id number]       Height:       66.0 in Accession #:    [id number]      Weight:       184.7 lb Date of Birth:  08-13-1940        BSA:          1.933 m Patient Age:    83 years        BP:           118/91 mmHg Patient Gender: M               HR:            86 bpm. Exam Location:  Inpatient Procedure: Transesophageal Echo, Color Doppler and Cardiac Doppler (Both            Spectral and Color Flow Doppler were utilized during procedure). Indications:  Afib, cardioversion  History:        Patient has prior history of Echocardiogram examinations, most                 recent 01/21/2024.  Sonographer:    Tinnie Gosling RDCS Referring Phys: [id number] MARK C SKAINS PROCEDURE: The transesophogeal probe was passed without difficulty through the esophogus of the patient. Sedation performed by different physician. The patient was monitored while under deep sedation. The patient's vital signs; including heart rate, blood pressure, and oxygen saturation; remained stable throughout the procedure. The patient developed no complications during the procedure. A successful direct current cardioversion was performed at 200 joules with 1 attempt.  IMPRESSIONS  1. Left ventricular ejection fraction, by estimation, is 20 to 25%. The left ventricle has severely decreased function. The left ventricle has no regional wall motion abnormalities.  2. Right ventricular systolic function is normal. The right ventricular size is normal.  3. Left atrial size was mildly dilated. No left atrial/left atrial appendage thrombus was detected.  4. Right atrial size was mildly dilated.  5. The mitral valve is normal in structure. Mild mitral valve regurgitation. No evidence of mitral stenosis.  6. The aortic valve is tricuspid. There is mild calcification of the aortic valve. There is mild thickening of the aortic valve. Aortic valve regurgitation is not visualized. No aortic stenosis is present.  7. The inferior vena cava is normal in size with greater than 50% respiratory variability, suggesting right atrial pressure of 3 mmHg.  8. There is a small patent foramen ovale with predominantly left to right shunting across the atrial septum. FINDINGS  Left Ventricle: Left ventricular ejection fraction, by estimation,  is 20 to 25%. The left ventricle has severely decreased function. The left ventricle has no regional wall motion abnormalities. The left ventricular internal cavity size was normal in size. There is no left ventricular hypertrophy. Right Ventricle: The right ventricular size is normal. No increase in right ventricular wall thickness. Right ventricular systolic function is normal. Left Atrium: Left atrial size was mildly dilated. Spontaneous echo contrast was present. No left atrial/left atrial appendage thrombus was detected. Right Atrium: Right atrial size was mildly dilated. Pericardium: There is no evidence of pericardial effusion. Mitral Valve: The mitral valve is normal in structure. Mild mitral valve regurgitation. No evidence of mitral valve stenosis. Tricuspid Valve: The tricuspid valve is normal in structure. Tricuspid valve regurgitation is mild . No evidence of tricuspid stenosis. Aortic Valve: The aortic valve is tricuspid. There is mild calcification of the aortic valve. There is mild thickening of the aortic valve. Aortic valve regurgitation is not visualized. No aortic stenosis is present. Pulmonic Valve: The pulmonic valve was normal in structure. Pulmonic valve regurgitation is not visualized. No evidence of pulmonic stenosis. Aorta: The aortic root is normal in size and structure. Venous: The inferior vena cava is normal in size with greater than 50% respiratory variability, suggesting right atrial pressure of 3 mmHg. IAS/Shunts: No atrial level shunt detected by color flow Doppler. A small patent foramen ovale is detected with predominantly left to right shunting across the atrial septum. Oneil Parchment MD Electronically signed by Oneil Parchment MD Signature Date/Time: 01/22/2024/2:34:31 PM    Final    EP STUDY Result Date: 01/22/2024 See surgical note for result.  DG Abd 1 View Result Date: 01/21/2024 CLINICAL DATA:  Abdominal pain EXAM: ABDOMEN - 1 VIEW COMPARISON:  Abdominal x-ray 03/27/2019  FINDINGS: The bowel gas pattern is normal. There is a large  amount of stool throughout the colon. There are atherosclerotic calcifications of the splenic artery. Degenerative changes affect the spine and hips. No suspicious calcifications are seen. IMPRESSION: Nonobstructive bowel gas pattern. Large amount of stool throughout the colon. Electronically Signed   By: Greig Pique M.D.   On: 01/21/2024 17:50   ECHOCARDIOGRAM COMPLETE Result Date: 01/21/2024    ECHOCARDIOGRAM REPORT   Patient Name:   LENO MATHES Stocks Date of Exam: 01/21/2024 Medical Rec #:  [id number]       Height:       66.0 in Accession #:    [id number]      Weight:       188.5 lb Date of Birth:  11/24/1940        BSA:          1.950 m Patient Age:    83 years        BP:           153/97 mmHg Patient Gender: M               HR:           82 bpm. Exam Location:  Inpatient Procedure: 2D Echo, Cardiac Doppler and Color Doppler (Both Spectral and Color            Flow Doppler were utilized during procedure). Indications:    I50.31 Acute diastolic (congestive) heart failure  History:        Patient has prior history of Echocardiogram examinations, most                 recent 07/02/2021. CAD, Arrythmias:Atrial Fibrillation,                 Signs/Symptoms:Parkinson's; Risk Factors:Hypertension and                 Dyslipidemia.  Sonographer:    Damien Senior RDCS Referring Phys: [id number] MARSA NOVAK MELVIN IMPRESSIONS  1. Left ventricular ejection fraction, by estimation, is 30 to 35%. The left ventricle has moderately decreased function. The left ventricle demonstrates global hypokinesis. Left ventricular diastolic parameters are consistent with Grade I diastolic dysfunction (impaired relaxation).  2. Right ventricular systolic function is normal. The right ventricular size is normal. Mildly increased right ventricular wall thickness.  3. Left atrial size was moderately dilated.  4. Right atrial size was severely dilated.  5. A small pericardial effusion is  present. Large pleural effusion in both left and right lateral regions.  6. The mitral valve is normal in structure. Moderate mitral valve regurgitation. No evidence of mitral stenosis.  7. The aortic valve is calcified. There is mild calcification of the aortic valve. There is mild thickening of the aortic valve. Aortic valve regurgitation is not visualized. Aortic valve sclerosis is present, with no evidence of aortic valve stenosis. Aortic valve mean gradient measures 4.3 mmHg. Aortic valve Vmax measures 1.30 m/s.  8. The inferior vena cava is normal in size with greater than 50% respiratory variability, suggesting right atrial pressure of 3 mmHg. FINDINGS  Left Ventricle: Left ventricular ejection fraction, by estimation, is 30 to 35%. The left ventricle has moderately decreased function. The left ventricle demonstrates global hypokinesis. The left ventricular internal cavity size was normal in size. There is no left ventricular hypertrophy. Left ventricular diastolic parameters are consistent with Grade I diastolic dysfunction (impaired relaxation). Right Ventricle: The right ventricular size is normal. Mildly increased right ventricular wall thickness. Right ventricular systolic function is normal. Left Atrium: Left atrial size  was moderately dilated. Right Atrium: Right atrial size was severely dilated. Pericardium: A small pericardial effusion is present. Mitral Valve: The mitral valve is normal in structure. Mild mitral annular calcification. Moderate mitral valve regurgitation. No evidence of mitral valve stenosis. Tricuspid Valve: The tricuspid valve is normal in structure. Tricuspid valve regurgitation is mild . No evidence of tricuspid stenosis. Aortic Valve: The aortic valve is calcified. There is mild calcification of the aortic valve. There is mild thickening of the aortic valve. Aortic valve regurgitation is not visualized. Aortic valve sclerosis is present, with no evidence of aortic valve  stenosis. Aortic valve mean gradient measures 4.3 mmHg. Aortic valve peak gradient measures 6.7 mmHg. Aortic valve area, by VTI measures 1.63 cm. Pulmonic Valve: The pulmonic valve was normal in structure. Pulmonic valve regurgitation is trivial. No evidence of pulmonic stenosis. Aorta: The aortic root is normal in size and structure. Venous: The inferior vena cava is normal in size with greater than 50% respiratory variability, suggesting right atrial pressure of 3 mmHg. IAS/Shunts: No atrial level shunt detected by color flow Doppler. Additional Comments: There is a large pleural effusion in both left and right lateral regions.  LEFT VENTRICLE PLAX 2D LVIDd:         5.70 cm LVIDs:         4.90 cm LV PW:         1.10 cm LV IVS:        1.00 cm LVOT diam:     2.30 cm LV SV:         41 LV SV Index:   21 LVOT Area:     4.15 cm  RIGHT VENTRICLE RV S prime:     9.62 cm/s TAPSE (M-mode): 2.0 cm LEFT ATRIUM              Index        RIGHT ATRIUM           Index LA diam:        4.90 cm  2.51 cm/m   RA Area:     29.70 cm LA Vol (A2C):   110.1 ml 56.46 ml/m  RA Volume:   117.00 ml 60.00 ml/m LA Vol (A4C):   80.7 ml  41.38 ml/m LA Biplane Vol: 90.7 ml  46.51 ml/m  AORTIC VALVE AV Area (Vmax):    1.68 cm AV Area (Vmean):   1.66 cm AV Area (VTI):     1.63 cm AV Vmax:           129.67 cm/s AV Vmean:          96.767 cm/s AV VTI:            0.253 m AV Peak Grad:      6.7 mmHg AV Mean Grad:      4.3 mmHg LVOT Vmax:         52.48 cm/s LVOT Vmean:        38.750 cm/s LVOT VTI:          0.099 m LVOT/AV VTI ratio: 0.39  AORTA Ao Root diam: 3.40 cm Ao Asc diam:  3.60 cm  SHUNTS Systemic VTI:  0.10 m Systemic Diam: 2.30 cm Oneil Parchment MD Electronically signed by Oneil Parchment MD Signature Date/Time: 01/21/2024/2:23:04 PM    Final    CT CHEST ABDOMEN PELVIS WO CONTRAST Result Date: 01/20/2024 CLINICAL DATA:  The unintended weight loss EXAM: CT CHEST, ABDOMEN AND PELVIS WITHOUT CONTRAST TECHNIQUE: Multidetector CT imaging of the  chest, abdomen and pelvis was performed following the standard protocol without IV contrast. RADIATION DOSE REDUCTION: This exam was performed according to the departmental dose-optimization program which includes automated exposure control, adjustment of the mA and/or kV according to patient size and/or use of iterative reconstruction technique. COMPARISON:  Same day chest radiograph and prior studies FINDINGS: CT CHEST FINDINGS Cardiovascular: Normal caliber aorta. Scattered aortic calcifications. No pericardial effusion. Mediastinum/Nodes: No lymphadenopathy. Lungs/Pleura: Small to moderate bilateral pleural effusions with associated bibasilar atelectasis/consolidation. Interlobular septal thickening, suggestive of mild pulmonary edema. Tiny 0.5 cm right middle lobe pulmonary nodule, unchanged from prior. Musculoskeletal: No acute osseous findings. CT ABDOMEN PELVIS FINDINGS Hepatobiliary: Unremarkable. Pancreas: Unremarkable. Spleen: Unremarkable. Adrenals/Urinary Tract: Adrenal glands are unremarkable. Symmetric nephrograms. No hydronephrosis or nephrolithiasis. 8.2 x 5.4 cm left renal cyst. Stomach/Bowel: No evidence of bowel obstruction or inflammation. The visualized appendix is unremarkable. Vascular/Lymphatic: Aortic atherosclerosis. No enlarged abdominal or pelvic lymph nodes. Reproductive: Enlarged prostate, measuring approximately 5.7 by 5.4 cm. Scattered prostatic calcifications, similar to prior. Other: No free air free fluid. Fat containing bilateral inguinal and umbilical hernias. Musculoskeletal: No acute osseous findings. IMPRESSION: 1. Small to moderate bilateral pleural effusions with likely mild interstitial pulmonary edema. 2. Stable additional chronic and incidental findings as noted above. Electronically Signed   By: Michaeline Blanch M.D.   On: 01/20/2024 14:41   DG Shoulder Left Result Date: 01/20/2024 EXAM: 1 VIEW XRAY OF THE LEFT SHOULDER 01/20/2024 01:23:00 PM COMPARISON: None available.  CLINICAL HISTORY: L shoulder pain. FINDINGS: BONES AND JOINTS: No fracture. Moderate osteoarthritis of the acromioclavicular joint. Superior subluxation of humeral head and remodeling of the undersurface of the acromion . SOFT TISSUES: No abnormal calcifications. Visualized lung is unremarkable. IMPRESSION: 1. No fracture. 2. Superior subluxation of the humeral head and remodeling of the undersurface of the acromion, likely reflecting chronic rotator cuff pathology. Electronically signed by: Selinda Blue MD 01/20/2024 02:14 PM EDT RP Workstation: [id number]   DG Chest Portable 1 View Result Date: 01/20/2024 EXAM: 1 VIEW XRAY OF THE CHEST 01/20/2024 12:01:00 PM COMPARISON: 05/25/2023 CLINICAL HISTORY: SOB, atrial fibrillation FINDINGS: LUNGS AND PLEURA: Small bilateral pleural effusions. Mild pulmonary edema. Bibasilar atelectasis. HEART AND MEDIASTINUM: Borderline mild cardiomegaly. BONES AND SOFT TISSUES: No acute osseous abnormality. IMPRESSION: 1. Mild CHF with small bilateral pleural effusions and bibasilar atelectasis. Electronically signed by: Selinda Blue MD 01/20/2024 12:05 PM EDT RP Workstation: [id number]   CT HEAD WO CONTRAST ( ) Result Date: 01/16/2024 CLINICAL DATA:  parkinsonism EXAM: CT HEAD WITHOUT CONTRAST TECHNIQUE: Contiguous axial images were obtained from the base of the skull through the vertex without intravenous contrast. RADIATION DOSE REDUCTION: This exam was performed according to the departmental dose-optimization program which includes automated exposure control, adjustment of the mA and/or kV according to patient size and/or use of iterative reconstruction technique. COMPARISON:  MRI head February 19, 2008. FINDINGS: Brain: No evidence of acute infarction, hemorrhage, hydrocephalus, extra-axial collection or mass lesion/mass effect. Moderate patchy white matter hypodensities are nonspecific but compatible with chronic microvascular ischemic change. Vascular: Calcific atherosclerosis.  Skull: No acute fracture. Sinuses/Orbits: Clear sinuses.  No acute orbital findings. Other: No mastoid effusions. IMPRESSION: 1. No evidence of acute intracranial abnormality. 2. Moderate chronic microvascular ischemic disease. Electronically Signed   By: Gilmore GORMAN Molt M.D.   On: 01/16/2024 09:07    Microbiology: No results found for this or any previous visit.  Labs: CBC: Recent Labs  Lab 01/20/24 1149 01/21/24 0320 01/22/24 0233  WBC 10.5 11.2* 11.5*  NEUTROABS 8.2*  --   --  HGB 13.3 13.1 13.9  HCT 42.0 40.6 43.0  MCV 93.1 90.6 90.1  PLT 159 159 163   Basic Metabolic Panel: Recent Labs  Lab 01/20/24 1149 01/21/24 0320 01/22/24 0233 01/23/24 0229 01/24/24 0643  NA 140 141 141 141 142  K 4.0 3.6 3.5 3.4* 4.3  CL 106 105 103 102 106  CO2 25 24 25 26 26   GLUCOSE 110* 94 105* 100* 94  BUN 24* 22 33* 30* 28*  CREATININE 1.67* 1.70* 2.03* 1.87* 1.79*  CALCIUM  9.3 8.8* 8.5* 8.8* 8.6*  MG 1.9 1.9  --  2.1 2.1   Liver Function Tests: Recent Labs  Lab 01/20/24 1149 01/21/24 0320  AST 20 10*  ALT 7 <5  ALKPHOS 51 47  BILITOT 1.4* 1.3*  PROT 6.4* 4.8*  ALBUMIN 3.5 3.3*   CBG: Recent Labs  Lab 01/20/24 1143 01/22/24 1247  GLUCAP 109* 98    Discharge time spent: greater than 30 minutes.  Signed: Elidia Toribio Furnace, MD [facility] 01/24/2024

## 2024-01-24 NOTE — Plan of Care (Signed)
  Problem: Education: Goal: Ability to demonstrate management of disease process will improve Outcome: Progressing   Problem: Education: Goal: Knowledge of General Education information will improve Description: Including pain rating scale, medication(s)/side effects and non-pharmacologic comfort measures Outcome: Progressing   Problem: Clinical Measurements: Goal: Diagnostic test results will improve Outcome: Progressing Goal: Cardiovascular complication will be avoided Outcome: Progressing   Problem: Clinical Measurements: Goal: Cardiovascular complication will be avoided Outcome: Progressing   Problem: Coping: Goal: Level of anxiety will decrease Outcome: Progressing

## 2024-01-24 NOTE — Telephone Encounter (Signed)
 Patient Product/process development scientist completed.    The patient is insured through [employer]. Patient has Medicare and is not eligible for a copay card, but may be able to apply for patient assistance or Medicare RX Payment Plan (Patient Must reach out to their plan, if eligible for payment plan), if available.    Ran test claim for Farxiga  10 mg and the current 30 day co-pay is $270.74.  Ran test claim for Jardiance  10 mg and the current 30 day co-pay is $124.31.  This test claim was processed through [facility]- copay amounts may vary at other pharmacies due to pharmacy/plan contracts, or as the patient moves through the different stages of their insurance plan.     Reyes Sharps, CPHT Pharmacy Technician III Certified Patient Advocate [facility] Patient Advocate Team Direct Number: [phone]  Fax: [phone]

## 2024-01-24 NOTE — Progress Notes (Signed)
 Patient awaiting for DME's to be delivered at bedside, and stated that his son is otw to bring him change of clothes.

## 2024-01-24 NOTE — Progress Notes (Signed)
 Occupational Therapy Treatment Patient Details Name: Keith [name] MRN: [id number] DOB: [birth year] Today's Date: 01/24/2024   History of present illness Pt is a 83 y.o. male admitted 9/1 for SOB and abdominal pain. Pt found to be in afib, and chest x-ray showed small bilateral effusions and bibasilar atelectasis. Admitted for management of CHF exacerbation and afib. S/p cardioversion 9/3. PMH: HTN, CHF, CKD, DVT, GERD, afib (new onset 12/19/23, had originally scheduled cardioversion but family canceled), chronic L shoulder injury, and Parkinson's.   OT comments  Pt progressing towards goals. Pt received in chair reporting he is d/c'ing but agreeable to work on standing ADLs. Progressed to increase standing time at sink to ~8 minutes at [facility]. Pt able to complete LB bathing standing at CGA. Pt continues to be limited by decreased standing balance and activity tolerance, encouraged seated grooming at home from w/c level upon d/c. Continue to recommend HHOT to optimize independence levels. Will continue to follow acutely.       If plan is discharge home, recommend the following:  A little help with walking and/or transfers;A little help with bathing/dressing/bathroom;Assistance with cooking/housework   Equipment Recommendations  BSC/3in1       Precautions / Restrictions Precautions Precautions: Fall Recall of Precautions/Restrictions: Intact Restrictions Weight Bearing Restrictions Per Provider Order: No       Mobility Bed Mobility Overal bed mobility: Needs Assistance         General bed mobility comments: Received in recliner    Transfers Overall transfer level: Needs assistance Equipment used: Rolling walker (2 wheels), None Transfers: Sit to/from Stand Sit to Stand: Contact guard assist           General transfer comment: No assist, poor hand placement, mildly impulsive     Balance Overall balance assessment: Needs assistance Sitting-balance support: No upper  extremity supported, Feet supported Sitting balance-Leahy Scale: Fair     Standing balance support: Bilateral upper extremity supported, During functional activity, Reliant on assistive device for balance Standing balance-Leahy Scale: Poor Standing balance comment: Benefits from RW           ADL either performed or assessed with clinical judgement   ADL Overall ADL's : Needs assistance/impaired     Grooming: Wash/dry face;Oral care;Wash/dry hands;Contact guard assist;Standing Grooming Details (indicate cue type and reason): CGA for standing     Lower Body Bathing: Contact guard assist;Sit to/from stand Lower Body Bathing Details (indicate cue type and reason): CGA for standing at sink peri hygiene   Functional mobility during ADLs: Contact guard assist;Rolling walker (2 wheels) General ADL Comments: Increased time and effort, decreased balance    Extremity/Trunk Assessment Upper Extremity Assessment Upper Extremity Assessment: Generalized weakness LUE Deficits / Details: DG showed Superior subluxation of the humeral head. ~80 degrees of shoulder flex and abd AROM, internal rotation WFL LUE: Shoulder pain with ROM LUE Sensation: WNL LUE Coordination: decreased gross motor;decreased fine motor   Lower Extremity Assessment Lower Extremity Assessment: Defer to PT evaluation        Vision   Vision Assessment?: No apparent visual deficits         Communication Communication Communication: No apparent difficulties   Cognition Arousal: Alert Behavior During Therapy: WFL for tasks assessed/performed Cognition: No apparent impairments     Following commands: Intact        Cueing   Cueing Techniques: Verbal cues, Gestural cues        General Comments VSS on RA    Pertinent Vitals/ Pain  Pain Assessment Pain Assessment: No/denies pain   Frequency  Min 2X/week        Progress Toward Goals  OT Goals(current goals can now be found in the care plan  section)  Progress towards OT goals: Progressing toward goals  Acute Rehab OT Goals Patient Stated Goal: To go visit wife OT Goal Formulation: With patient Time For Goal Achievement: 02/05/24 Potential to Achieve Goals: Good ADL Goals Pt Will Perform Grooming: with supervision;standing Pt Will Perform Lower Body Dressing: with supervision;sit to/from stand Pt Will Transfer to Toilet: with supervision;ambulating;regular height toilet Pt Will Perform Toileting - Clothing Manipulation and hygiene: with supervision;sit to/from stand;sitting/lateral leans  Plan         AM-PAC OT 6 Clicks Daily Activity     Outcome Measure   Help from another person eating meals?: None Help from another person taking care of personal grooming?: A Little Help from another person toileting, which includes using toliet, bedpan, or urinal?: A Little Help from another person bathing (including washing, rinsing, drying)?: A Little Help from another person to put on and taking off regular upper body clothing?: A Little Help from another person to put on and taking off regular lower body clothing?: A Little 6 Click Score: 19    End of Session Equipment Utilized During Treatment: Rolling walker (2 wheels)  OT Visit Diagnosis: Unsteadiness on feet (R26.81);Other abnormalities of gait and mobility (R26.89);Repeated falls (R29.6);Muscle weakness (generalized) (M62.81);Pain Pain - Right/Left: Left Pain - part of body: Shoulder   Activity Tolerance Patient tolerated treatment well   Patient Left in chair;with call bell/phone within reach   Nurse Communication Mobility status        Time: 1350-1401 OT Time Calculation (min): 11 min  Charges: OT General Charges $OT Visit: 1 Visit OT Treatments $Self Care/Home Management : 8-22 mins  Adrianne BROCKS, OT  Acute Rehabilitation Services Office [phone] Secure chat preferred   Adrianne GORMAN Savers 01/24/2024, 2:09 PM

## 2024-01-26 DIAGNOSIS — Z7984 Long term (current) use of oral hypoglycemic drugs: Secondary | ICD-10-CM | POA: Diagnosis not present

## 2024-01-26 DIAGNOSIS — R911 Solitary pulmonary nodule: Secondary | ICD-10-CM | POA: Diagnosis not present

## 2024-01-26 DIAGNOSIS — Z7901 Long term (current) use of anticoagulants: Secondary | ICD-10-CM | POA: Diagnosis not present

## 2024-01-26 DIAGNOSIS — M160 Bilateral primary osteoarthritis of hip: Secondary | ICD-10-CM | POA: Diagnosis not present

## 2024-01-26 DIAGNOSIS — K219 Gastro-esophageal reflux disease without esophagitis: Secondary | ICD-10-CM | POA: Diagnosis not present

## 2024-01-26 DIAGNOSIS — Q6101 Congenital single renal cyst: Secondary | ICD-10-CM | POA: Diagnosis not present

## 2024-01-26 DIAGNOSIS — N200 Calculus of kidney: Secondary | ICD-10-CM | POA: Diagnosis not present

## 2024-01-26 DIAGNOSIS — N1832 Chronic kidney disease, stage 3b: Secondary | ICD-10-CM | POA: Diagnosis not present

## 2024-01-26 DIAGNOSIS — I130 Hypertensive heart and chronic kidney disease with heart failure and stage 1 through stage 4 chronic kidney disease, or unspecified chronic kidney disease: Secondary | ICD-10-CM | POA: Diagnosis not present

## 2024-01-26 DIAGNOSIS — Z6829 Body mass index (BMI) 29.0-29.9, adult: Secondary | ICD-10-CM | POA: Diagnosis not present

## 2024-01-26 DIAGNOSIS — I700 Atherosclerosis of aorta: Secondary | ICD-10-CM | POA: Diagnosis not present

## 2024-01-26 DIAGNOSIS — M19012 Primary osteoarthritis, left shoulder: Secondary | ICD-10-CM | POA: Diagnosis not present

## 2024-01-26 DIAGNOSIS — N400 Enlarged prostate without lower urinary tract symptoms: Secondary | ICD-10-CM | POA: Diagnosis not present

## 2024-01-26 DIAGNOSIS — G20A1 Parkinson's disease without dyskinesia, without mention of fluctuations: Secondary | ICD-10-CM | POA: Diagnosis not present

## 2024-01-26 DIAGNOSIS — I2510 Atherosclerotic heart disease of native coronary artery without angina pectoris: Secondary | ICD-10-CM | POA: Diagnosis not present

## 2024-01-26 DIAGNOSIS — I5033 Acute on chronic diastolic (congestive) heart failure: Secondary | ICD-10-CM | POA: Diagnosis not present

## 2024-01-26 DIAGNOSIS — J309 Allergic rhinitis, unspecified: Secondary | ICD-10-CM | POA: Diagnosis not present

## 2024-01-26 DIAGNOSIS — I088 Other rheumatic multiple valve diseases: Secondary | ICD-10-CM | POA: Diagnosis not present

## 2024-01-26 DIAGNOSIS — E7800 Pure hypercholesterolemia, unspecified: Secondary | ICD-10-CM | POA: Diagnosis not present

## 2024-01-26 DIAGNOSIS — J9811 Atelectasis: Secondary | ICD-10-CM | POA: Diagnosis not present

## 2024-01-26 DIAGNOSIS — M1030 Gout due to renal impairment, unspecified site: Secondary | ICD-10-CM | POA: Diagnosis not present

## 2024-01-26 DIAGNOSIS — I501 Left ventricular failure: Secondary | ICD-10-CM | POA: Diagnosis not present

## 2024-01-26 DIAGNOSIS — I480 Paroxysmal atrial fibrillation: Secondary | ICD-10-CM | POA: Diagnosis not present

## 2024-01-26 DIAGNOSIS — G43909 Migraine, unspecified, not intractable, without status migrainosus: Secondary | ICD-10-CM | POA: Diagnosis not present

## 2024-01-27 ENCOUNTER — Telehealth: Payer: Self-pay

## 2024-01-27 DIAGNOSIS — I480 Paroxysmal atrial fibrillation: Secondary | ICD-10-CM | POA: Diagnosis not present

## 2024-01-27 DIAGNOSIS — M1030 Gout due to renal impairment, unspecified site: Secondary | ICD-10-CM | POA: Diagnosis not present

## 2024-01-27 DIAGNOSIS — N1832 Chronic kidney disease, stage 3b: Secondary | ICD-10-CM | POA: Diagnosis not present

## 2024-01-27 DIAGNOSIS — I130 Hypertensive heart and chronic kidney disease with heart failure and stage 1 through stage 4 chronic kidney disease, or unspecified chronic kidney disease: Secondary | ICD-10-CM | POA: Diagnosis not present

## 2024-01-27 DIAGNOSIS — I5033 Acute on chronic diastolic (congestive) heart failure: Secondary | ICD-10-CM | POA: Diagnosis not present

## 2024-01-27 DIAGNOSIS — I501 Left ventricular failure: Secondary | ICD-10-CM | POA: Diagnosis not present

## 2024-01-27 NOTE — Transitions of Care (Post Inpatient/ED Visit) (Signed)
   01/27/2024  Name: Keith [name] MRN: [id number] DOB: 1940-10-18  Today's TOC FU Call Status: Today's TOC FU Call Status:: Unsuccessful Call (1st Attempt) Unsuccessful Call (1st Attempt) Date: 01/27/24  Attempted to reach the patient regarding the most recent Inpatient/ED visit.  Follow Up Plan: Additional outreach attempts will be made to reach the patient to complete the Transitions of Care (Post Inpatient/ED visit) call.   Medford Balboa, BSN, RN [place]  VBCI - [facility] RN Care Manager [phone]

## 2024-01-28 ENCOUNTER — Telehealth: Payer: Self-pay

## 2024-01-28 ENCOUNTER — Telehealth: Payer: Self-pay | Admitting: Cardiology

## 2024-01-28 NOTE — Telephone Encounter (Signed)
 Returned call to daughter Sueanne left message on personal voice mail to call back.

## 2024-01-28 NOTE — Transitions of Care (Post Inpatient/ED Visit) (Signed)
   01/28/2024  Name: STEFFON GLADU MRN: [id number] DOB: 1941-02-27  Today's TOC FU Call Status: Today's TOC FU Call Status:: Unsuccessful Call (2nd Attempt) Unsuccessful Call (2nd Attempt) Date: 01/28/24  Attempted to reach the patient regarding the most recent Inpatient/ED visit.  Follow Up Plan: Additional outreach attempts will be made to reach the patient to complete the Transitions of Care (Post Inpatient/ED visit) call.   Margarete Horace J. Jarelly Rinck RN, MSN [facility], [facility] RN Care Manager Direct Dial: [phone]  Fax: [phone] Website: [email or web address]

## 2024-01-28 NOTE — Telephone Encounter (Signed)
 Left voice message to call back 9/9

## 2024-01-28 NOTE — Telephone Encounter (Signed)
 Patient c/o Palpitations:  STAT if patient reporting lightheadedness, shortness of breath, or chest pain  How long have you had palpitations/irregular HR/ Afib? Are you having the symptoms now? Keith [name]  Are you currently experiencing lightheadedness, SOB or CP? no  Do you have a history of afib (atrial fibrillation) or irregular heart rhythm? yes  Have you checked your BP or HR? (document readings if available): Home nurse checked him and told him he was backed in afib  Are you experiencing any other symptoms? Feet swelling

## 2024-01-28 NOTE — Telephone Encounter (Signed)
 Daughter returning call, she states if she dont answer then it okay to leave voicemail. Please advise

## 2024-01-29 ENCOUNTER — Telehealth: Payer: Self-pay

## 2024-01-29 DIAGNOSIS — I130 Hypertensive heart and chronic kidney disease with heart failure and stage 1 through stage 4 chronic kidney disease, or unspecified chronic kidney disease: Secondary | ICD-10-CM | POA: Diagnosis not present

## 2024-01-29 DIAGNOSIS — M159 Polyosteoarthritis, unspecified: Secondary | ICD-10-CM | POA: Diagnosis not present

## 2024-01-29 DIAGNOSIS — N400 Enlarged prostate without lower urinary tract symptoms: Secondary | ICD-10-CM | POA: Diagnosis not present

## 2024-01-29 DIAGNOSIS — I4891 Unspecified atrial fibrillation: Secondary | ICD-10-CM | POA: Diagnosis not present

## 2024-01-29 DIAGNOSIS — M1030 Gout due to renal impairment, unspecified site: Secondary | ICD-10-CM | POA: Diagnosis not present

## 2024-01-29 DIAGNOSIS — G20A1 Parkinson's disease without dyskinesia, without mention of fluctuations: Secondary | ICD-10-CM | POA: Diagnosis not present

## 2024-01-29 DIAGNOSIS — I501 Left ventricular failure: Secondary | ICD-10-CM | POA: Diagnosis not present

## 2024-01-29 DIAGNOSIS — N1832 Chronic kidney disease, stage 3b: Secondary | ICD-10-CM | POA: Diagnosis not present

## 2024-01-29 DIAGNOSIS — I480 Paroxysmal atrial fibrillation: Secondary | ICD-10-CM | POA: Diagnosis not present

## 2024-01-29 DIAGNOSIS — I129 Hypertensive chronic kidney disease with stage 1 through stage 4 chronic kidney disease, or unspecified chronic kidney disease: Secondary | ICD-10-CM | POA: Diagnosis not present

## 2024-01-29 DIAGNOSIS — Z09 Encounter for follow-up examination after completed treatment for conditions other than malignant neoplasm: Secondary | ICD-10-CM | POA: Diagnosis not present

## 2024-01-29 DIAGNOSIS — I5033 Acute on chronic diastolic (congestive) heart failure: Secondary | ICD-10-CM | POA: Diagnosis not present

## 2024-01-29 NOTE — Telephone Encounter (Signed)
 Sueanne returned RN's call.

## 2024-01-29 NOTE — Telephone Encounter (Signed)
 Spoke with the patient's daughter who states that the patient was admitted to the hospital last week and was cardioverted on Wednesday. He was discharged on Friday. They had a home health nurse come by Sunday and said that he was back in afib. Patient is asymptomatic and unaware of being in afib. Heart rates have been controlled and he has been taking his eliquis  as prescribed. He has a follow up appointment next week. Advised the daughter that he should keep appointment as scheduled and to let us  know if he becomes symptomatic or if heart rates increase.

## 2024-01-29 NOTE — Transitions of Care (Post Inpatient/ED Visit) (Signed)
   01/29/2024  Name: Keith [name] MRN: [id number] DOB: Jun 12, 1940  Today's TOC FU Call Status: Today's TOC FU Call Status:: Unsuccessful Call (3rd Attempt) Unsuccessful Call (3rd Attempt) Date: 01/29/24  Attempted to reach the patient regarding the most recent Inpatient/ED visit.  Follow Up Plan: No further outreach attempts will be made at this time. We have been unable to contact the patient.  Alura Olveda J. Madyson Lukach RN, MSN [facility], [facility] RN Care Manager Direct Dial: [phone]  Fax: [phone] Website: [email or web address]

## 2024-01-30 ENCOUNTER — Inpatient Hospital Stay (HOSPITAL_BASED_OUTPATIENT_CLINIC_OR_DEPARTMENT_OTHER)
Admission: RE | Admit: 2024-01-30 | Discharge: 2024-01-30 | Disposition: A | Discharge status: Home / Self Care | Source: Ambulatory Visit | Attending: Cardiology | Admitting: Cardiology

## 2024-01-30 DIAGNOSIS — I4891 Unspecified atrial fibrillation: Secondary | ICD-10-CM | POA: Diagnosis not present

## 2024-01-30 LAB — ECHOCARDIOGRAM COMPLETE
AR max vel: 1.72 cm2
AV Area VTI: 1.7 cm2
AV Area mean vel: 1.59 cm2
AV Mean grad: 9.6 mmHg
AV Peak grad: 15.2 mmHg
Ao pk vel: 1.95 m/s
Area-P 1/2: 4.14 cm2
S' Lateral: 4.8 cm

## 2024-01-30 NOTE — Progress Notes (Signed)
 No change in echo from hospital echo/TEE. Will discuss on his visit

## 2024-01-31 DIAGNOSIS — I130 Hypertensive heart and chronic kidney disease with heart failure and stage 1 through stage 4 chronic kidney disease, or unspecified chronic kidney disease: Secondary | ICD-10-CM | POA: Diagnosis not present

## 2024-01-31 DIAGNOSIS — I480 Paroxysmal atrial fibrillation: Secondary | ICD-10-CM | POA: Diagnosis not present

## 2024-01-31 DIAGNOSIS — I501 Left ventricular failure: Secondary | ICD-10-CM | POA: Diagnosis not present

## 2024-01-31 DIAGNOSIS — I5033 Acute on chronic diastolic (congestive) heart failure: Secondary | ICD-10-CM | POA: Diagnosis not present

## 2024-01-31 DIAGNOSIS — N1832 Chronic kidney disease, stage 3b: Secondary | ICD-10-CM | POA: Diagnosis not present

## 2024-01-31 DIAGNOSIS — M1030 Gout due to renal impairment, unspecified site: Secondary | ICD-10-CM | POA: Diagnosis not present

## 2024-02-04 DIAGNOSIS — I480 Paroxysmal atrial fibrillation: Secondary | ICD-10-CM | POA: Diagnosis not present

## 2024-02-04 DIAGNOSIS — M1030 Gout due to renal impairment, unspecified site: Secondary | ICD-10-CM | POA: Diagnosis not present

## 2024-02-04 DIAGNOSIS — I130 Hypertensive heart and chronic kidney disease with heart failure and stage 1 through stage 4 chronic kidney disease, or unspecified chronic kidney disease: Secondary | ICD-10-CM | POA: Diagnosis not present

## 2024-02-04 DIAGNOSIS — I501 Left ventricular failure: Secondary | ICD-10-CM | POA: Diagnosis not present

## 2024-02-04 DIAGNOSIS — N1832 Chronic kidney disease, stage 3b: Secondary | ICD-10-CM | POA: Diagnosis not present

## 2024-02-04 DIAGNOSIS — I5033 Acute on chronic diastolic (congestive) heart failure: Secondary | ICD-10-CM | POA: Diagnosis not present

## 2024-02-05 DIAGNOSIS — I480 Paroxysmal atrial fibrillation: Secondary | ICD-10-CM | POA: Diagnosis not present

## 2024-02-05 DIAGNOSIS — M1030 Gout due to renal impairment, unspecified site: Secondary | ICD-10-CM | POA: Diagnosis not present

## 2024-02-05 DIAGNOSIS — I130 Hypertensive heart and chronic kidney disease with heart failure and stage 1 through stage 4 chronic kidney disease, or unspecified chronic kidney disease: Secondary | ICD-10-CM | POA: Diagnosis not present

## 2024-02-05 DIAGNOSIS — I501 Left ventricular failure: Secondary | ICD-10-CM | POA: Diagnosis not present

## 2024-02-05 DIAGNOSIS — I5033 Acute on chronic diastolic (congestive) heart failure: Secondary | ICD-10-CM | POA: Diagnosis not present

## 2024-02-05 DIAGNOSIS — N1832 Chronic kidney disease, stage 3b: Secondary | ICD-10-CM | POA: Diagnosis not present

## 2024-02-06 DIAGNOSIS — I5033 Acute on chronic diastolic (congestive) heart failure: Secondary | ICD-10-CM | POA: Diagnosis not present

## 2024-02-06 DIAGNOSIS — N1831 Chronic kidney disease, stage 3a: Secondary | ICD-10-CM | POA: Insufficient documentation

## 2024-02-06 DIAGNOSIS — M1030 Gout due to renal impairment, unspecified site: Secondary | ICD-10-CM | POA: Diagnosis not present

## 2024-02-06 DIAGNOSIS — I480 Paroxysmal atrial fibrillation: Secondary | ICD-10-CM | POA: Diagnosis not present

## 2024-02-06 DIAGNOSIS — I350 Nonrheumatic aortic (valve) stenosis: Secondary | ICD-10-CM | POA: Insufficient documentation

## 2024-02-06 DIAGNOSIS — I340 Nonrheumatic mitral (valve) insufficiency: Secondary | ICD-10-CM | POA: Insufficient documentation

## 2024-02-06 DIAGNOSIS — N1832 Chronic kidney disease, stage 3b: Secondary | ICD-10-CM | POA: Diagnosis not present

## 2024-02-06 DIAGNOSIS — I130 Hypertensive heart and chronic kidney disease with heart failure and stage 1 through stage 4 chronic kidney disease, or unspecified chronic kidney disease: Secondary | ICD-10-CM | POA: Diagnosis not present

## 2024-02-06 DIAGNOSIS — I501 Left ventricular failure: Secondary | ICD-10-CM | POA: Diagnosis not present

## 2024-02-06 NOTE — Assessment & Plan Note (Signed)
 He did have AKI during diuresis for acute CHF.  This improved prior to discharge.***

## 2024-02-06 NOTE — Assessment & Plan Note (Signed)
 Patient has a previous history of reduced EF.  He was recently admitted with atrial fibrillation with RVR and acute CHF.  EF was down to 30-35.  He underwent TEE guided cardioversion.  Repeat echocardiogram last week continued to demonstrate EF 25-30.  He has been intolerant to lisinopril and valsartan in the past.  GDMT at discharge included Nebivolol , irbesartan  and Farxiga .  He did have AKI with diuresis and it was felt that MRA could be added if renal function improves.***

## 2024-02-06 NOTE — Progress Notes (Signed)
 OFFICE NOTE:    Date:  02/07/2024  ID:  Keith [name], DOB [birth year], MRN [id number] PCP: Gerome Brunet, DO  [facility] Providers Cardiologist:  Gordy Bergamo, MD        Persistent atrial fibrillation  Admitted with acute CHF, AF w RVR >> s/p TEE DCCV 01/2024  (HFrEF) heart failure with reduced ejection fraction EF improved to normal in the past  TTE in 01/2020: EF 35-40 >> TTE in 06/2021: EF 50-55 EF ? with AFib w RVR TTE 01/21/24: EF 30-35, GR 1 DD, normal RVSF, moderate LAE, severe RAE, small pericardial effusion, moderate MR, AV sclerosis TEE 01/22/2024: EF 20-25, mild to moderate MR, mild TR, no LAA clot, PFO TTE 01/30/2024: EF 25-30, global HK, moderately reduced RVSF, mild LAE, mild RAE, moderate MR, mild-moderate AS (mean 9.6, V-max 194.8 cm/s, DI 0.35) Aortic stenosis Mild to mod by TTE 01/2024 Mitral regurgitation Mod MR by TTE 01/2024  Coronary artery Ca2+ Patent Foramen Ovale  Hypertension  Chronic kidney disease stage 3 Hyperlipidemia  Hx of DVT in 11/2022 BPH Parkinson's disease        Discussed the use of AI scribe software for clinical note transcription with the patient, who gave verbal consent to proceed. History of Present Illness Keith [name] is a 83 y.o. male who returns for post hospital follow up. He was last seen by Dr. Bergamo 12/09/23. The pt was in new onset AFib with controlled rate. He was stared on Eliquis  with plans to arrange DCCV. He canceled the procedure and planned to get a second opinion.   He was admitted 9/1-9/5 w acute CHF in the setting of AF w RVR.  He was diuresed with IV furosemide .  This was complicated by AKI with creatinine increased to 2.03.  It improved to 1.79 prior to discharge.  TTE demonstrated his EF was reduced again at 30-35.  He had missed some doses of Eliquis . Therefore he underwent TEE DCCV with restoration of NSR.  He has had intolerances to multiple medications including lisinopril and valsartan.  He was continued on  Nebivolol .  Low-dose irbesartan  was added.  Farxiga  was also added.  It was felt that if his renal function improves, MRA could be considered.  Home bumetanide  was resumed.  Plan was for repeat echocardiogram in 3 months.  However, this was completed 01/30/2024 and demonstrated EF 25-30.  He is here with his daughter-in-law. He has been experiencing abdominal tightness and dyspnea, particularly with movement or ambulation. No chest pain or pressure, but there is discomfort in the abdominal area. He has a persistent clear cough.  This has been chronic for years.  No fever, vomiting, diarrhea, or hematochezia. Home weights have been around 188 lbs, and the patient and family do not report weight gain at home. He experiences elevated blood pressure at home, with a recent reading of 168/90 mmHg.   He denies smoking and reports no significant changes in his diet, particularly regarding salt intake. He primarily consumes water and some electrolyte drinks.    ROS-See HPI    Studies Reviewed:  EKG Interpretation Date/Time:  Friday February 07 2024 08:30:15 EDT Ventricular Rate:  64 PR Interval:  172 QRS Duration:  90 QT Interval:  444 QTC Calculation: 458 R Axis:   -45  Text Interpretation: Sinus rhythm with sinus arrhythmia with frequent Premature ventricular complexes Left anterior fascicular block Anterior infarct , age undetermined Confirmed by Lelon Hamilton [phone]) on 02/07/2024 8:33:19 AM    Results  LABS Creatinine: 2.03 (01/24/2024) Total Cholesterol: 182 (07/10/2023) HDL: 49 (07/10/2023) LDL: 883 (07/10/2023) Triglycerides: 91 (07/10/2023) Hemoglobin: 13.9 (01/22/2024) Creatinine: 1.79 (01/22/2024) Potassium: 4.3 (01/22/2024) TSH: 3.47 (07/10/2023)   Risk Assessment/Calculations: CHA2DS2-VASc Score = 5   This indicates a 7.2% annual risk of stroke. The patient's score is based upon: CHF History: 1 HTN History: 1 Diabetes History: 0 Stroke History: 0 Vascular Disease History:  1 Age Score: 2 Gender Score: 0    HYPERTENSION CONTROL Vitals:   02/07/24 0817 02/07/24 1321  BP: (!) 168/90 (!) 168/90    The patient's blood pressure is elevated above target today.  In order to address the patient's elevated BP: A current anti-hypertensive medication was adjusted today.         Physical Exam:  VS:  BP (!) 168/90   Pulse 64   Ht 5' 6 (1.676 m)   Wt 193 lb (87.5 kg)   SpO2 91%   BMI 31.15 kg/m        Wt Readings from Last 3 Encounters:  02/07/24 193 lb (87.5 kg)  01/24/24 183 lb 6.8 oz (83.2 kg)  01/02/24 189 lb 12.8 oz (86.1 kg)    Constitutional:      Appearance: Healthy appearance. Not in distress.  Neck:     Vascular: No JVR. JVD normal.  Pulmonary:     Breath sounds: No wheezing. Bibasilar Rales present.  Cardiovascular:     Normal rate. Irregular rhythm.     Murmurs: There is a grade 2/6 systolic murmur at the URSB.  Edema:    Peripheral edema present.    Pretibial: bilateral 1+ edema of the pretibial area.    Ankle: bilateral 2+ edema of the ankle. Abdominal:     General: There is distension.     Palpations: Abdomen is soft.       Assessment and Plan:    Assessment & Plan Acute on chronic heart failure with reduced ejection fraction (HFrEF, <= 40%) (HCC) Patient has a previous history of reduced EF.  He was recently admitted with atrial fibrillation with RVR and acute CHF.  EF was down to 30-35.  He underwent TEE guided cardioversion.  Repeat echocardiogram last week continued to demonstrate EF 25-30.  He has been intolerant to lisinopril and valsartan in the past.  GDMT at discharge included Nebivolol , irbesartan  and Farxiga .  He did have AKI with diuresis and it was felt that MRA could be added if renal function improves. His volume has worsened since last week. He is NYHA III. He has some increased abdominal distention and discomfort and rales on exam. We considered Furoscix  today. We do not have samples.  I discussed with one of our  providers in the heart failure clinic.  The dose of Furoscix  is large and after further thought, I think we can accomplish good diuresis with increasing his dose of bumetanide . - Increase bumetanide  to 1 mg twice daily x 2 days, then 1 mg daily - BMET, CBC today - Increase dietary potassium - Follow-up next week - He knows to go to the emergency room if symptoms should worsen - Continue Jardiance  10 mg daily, Nebivolol  20 mg daily - Increase irbesartan  to 150 mg daily for blood pressure control Persistent atrial fibrillation (HCC) He was admitted with atrial fibrillation with rapid ventricular rate.  This was complicated by acute CHF.  He underwent TEE guided cardioversion.  He is maintaining sinus rhythm.  He does have a lot of PVCs.  If these continue, consider monitoring  to assess PVC burden. - Continue Eliquis  2.5 mg twice daily, Nebivolol  20 mg daily Stage 3a chronic kidney disease (HCC) He did have AKI during diuresis for acute CHF.  This improved prior to discharge. - BMET today Nonrheumatic aortic (valve) stenosis Mild to moderate by recent echocardiogram.  Nonrheumatic mitral valve regurgitation Moderate by recent echocardiogram.  Essential hypertension Blood pressure uncontrolled.   - Increase irbesartan  to 150 mg daily.   - Continue Nebivolol  20 mg daily.   - If needed, add hydralazine .         Dispo:  Return in about 1 week (around 02/14/2024) for Close Follow Up, w/ Glendia Ferrier, PA-C.  Signed, Glendia Ferrier, PA-C

## 2024-02-06 NOTE — Assessment & Plan Note (Signed)
Mild to moderate by recent echocardiogram. 

## 2024-02-06 NOTE — Assessment & Plan Note (Signed)
Moderate by recent echocardiogram. 

## 2024-02-06 NOTE — Assessment & Plan Note (Signed)
 He was admitted with atrial fibrillation with rapid ventricular rate.  This was complicated by acute CHF.  He underwent TEE guided cardioversion.***

## 2024-02-07 ENCOUNTER — Encounter: Payer: Self-pay | Admitting: Physician Assistant

## 2024-02-07 ENCOUNTER — Ambulatory Visit: Attending: Physician Assistant | Admitting: Physician Assistant

## 2024-02-07 VITALS — BP 168/90 | HR 64 | Ht 66.0 in | Wt 193.0 lb

## 2024-02-07 DIAGNOSIS — I10 Essential (primary) hypertension: Secondary | ICD-10-CM | POA: Diagnosis not present

## 2024-02-07 DIAGNOSIS — I5020 Unspecified systolic (congestive) heart failure: Secondary | ICD-10-CM

## 2024-02-07 DIAGNOSIS — I350 Nonrheumatic aortic (valve) stenosis: Secondary | ICD-10-CM | POA: Insufficient documentation

## 2024-02-07 DIAGNOSIS — N1831 Chronic kidney disease, stage 3a: Secondary | ICD-10-CM | POA: Insufficient documentation

## 2024-02-07 DIAGNOSIS — I340 Nonrheumatic mitral (valve) insufficiency: Secondary | ICD-10-CM | POA: Diagnosis not present

## 2024-02-07 DIAGNOSIS — I4819 Other persistent atrial fibrillation: Secondary | ICD-10-CM | POA: Diagnosis not present

## 2024-02-07 DIAGNOSIS — I5023 Acute on chronic systolic (congestive) heart failure: Secondary | ICD-10-CM | POA: Diagnosis not present

## 2024-02-07 LAB — CBC
Hematocrit: 41.9 % (ref 37.5–51.0)
Hemoglobin: 13.4 g/dL (ref 13.0–17.7)
MCH: 29.3 pg (ref 26.6–33.0)
MCHC: 32 g/dL (ref 31.5–35.7)
MCV: 92 fL (ref 79–97)
Platelets: 172 x10E3/uL (ref 150–450)
RBC: 4.57 x10E6/uL (ref 4.14–5.80)
RDW: 13.7 % (ref 11.6–15.4)
WBC: 12.9 x10E3/uL — ABNORMAL HIGH (ref 3.4–10.8)

## 2024-02-07 LAB — BASIC METABOLIC PANEL WITH GFR
BUN/Creatinine Ratio: 12 (ref 10–24)
BUN: 23 mg/dL (ref 8–27)
CO2: 21 mmol/L (ref 20–29)
Calcium: 9.1 mg/dL (ref 8.6–10.2)
Chloride: 105 mmol/L (ref 96–106)
Creatinine, Ser: 1.93 mg/dL — ABNORMAL HIGH (ref 0.76–1.27)
Glucose: 87 mg/dL (ref 70–99)
Potassium: 4.4 mmol/L (ref 3.5–5.2)
Sodium: 141 mmol/L (ref 134–144)
eGFR: 34 mL/min/1.73 — ABNORMAL LOW (ref >59)

## 2024-02-07 MED ORDER — IRBESARTAN 150 MG PO TABS: 150.0000 mg | ORAL_TABLET | Freq: Every day | ORAL | 3 refills | Status: AC

## 2024-02-07 MED ORDER — BUMETANIDE 0.5 MG PO TABS: ORAL_TABLET | ORAL | 3 refills | Status: DC

## 2024-02-07 NOTE — Assessment & Plan Note (Addendum)
 Blood pressure uncontrolled.   - Increase irbesartan  to 150 mg daily.   - Continue Nebivolol  20 mg daily.   - If needed, add hydralazine .

## 2024-02-07 NOTE — Patient Instructions (Addendum)
 Medication Instructions:  Your physician has recommended you make the following change in your medication:  INCREASE the Irbesartan  to 150 mg taking 1 daily  INCREASE the Bumex  to 2 tablets twice a day for 2 days the decrease down to 2 tablets in the morning   *If you need a refill on your cardiac medications before your next appointment, please call your pharmacy*  Lab Work: TODAY:  BMET & CBC  If you have labs (blood work) drawn today and your tests are completely normal, you will receive your results only by: MyChart Message (if you have MyChart) OR A paper copy in the mail If you have any lab test that is abnormal or we need to change your treatment, we will call you to review the results.  Testing/Procedures: None ordered  Follow-Up: At [facility], you and your health needs are our priority.  As part of our continuing mission to provide you with exceptional heart care, our providers are all part of one team.  This team includes your primary Cardiologist (physician) and Advanced Practice Providers or APPs (Physician Assistants and Nurse Practitioners) who all work together to provide you with the care you need, when you need it.  Your next appointment:   4 day(s)  Provider:   Glendia Ferrier, PA-C          We recommend signing up for the patient portal called MyChart.  Sign up information is provided on this After Visit Summary.  MyChart is used to connect with patients for Virtual Visits (Telemedicine).  Patients are able to view lab/test results, encounter notes, upcoming appointments, etc.  Non-urgent messages can be sent to your provider as well.   To learn more about what you can do with MyChart, go to [email or web address].   Other Instructions

## 2024-02-09 ENCOUNTER — Ambulatory Visit: Payer: Self-pay | Admitting: Physician Assistant

## 2024-02-11 ENCOUNTER — Ambulatory Visit: Admitting: Physician Assistant

## 2024-02-11 DIAGNOSIS — I5033 Acute on chronic diastolic (congestive) heart failure: Secondary | ICD-10-CM | POA: Diagnosis not present

## 2024-02-11 DIAGNOSIS — N1832 Chronic kidney disease, stage 3b: Secondary | ICD-10-CM | POA: Diagnosis not present

## 2024-02-11 DIAGNOSIS — I501 Left ventricular failure: Secondary | ICD-10-CM | POA: Diagnosis not present

## 2024-02-11 DIAGNOSIS — I480 Paroxysmal atrial fibrillation: Secondary | ICD-10-CM | POA: Diagnosis not present

## 2024-02-11 DIAGNOSIS — I130 Hypertensive heart and chronic kidney disease with heart failure and stage 1 through stage 4 chronic kidney disease, or unspecified chronic kidney disease: Secondary | ICD-10-CM | POA: Diagnosis not present

## 2024-02-11 DIAGNOSIS — M1030 Gout due to renal impairment, unspecified site: Secondary | ICD-10-CM | POA: Diagnosis not present

## 2024-02-11 NOTE — Assessment & Plan Note (Signed)
 He was admitted with atrial fibrillation with rapid ventricular rate. This was complicated by acute CHF. He underwent TEE guided cardioversion. Today, he is back in AFib with controlled rate. I discussed with Dr. Ladona. He will not likely tolerate this for long.  -Start Amiodarone  400 mg once daily x 2 weeks, then 200 mg once daily -Continue Eliquis  2.5 mg twice daily, Nebivolol  20 mg once daily -Follow up 3-4 weeks -Will arrange DCCV if still in AFib at that time.

## 2024-02-11 NOTE — Progress Notes (Unsigned)
 OFFICE NOTE:    Date:  02/12/2024  ID:  Keith [name], DOB 1940-12-14, MRN [id number] PCP: Keith Brunet, DO  [facility] Providers Cardiologist:  Gordy Bergamo, MD        Persistent atrial fibrillation  Admitted with acute CHF, AF w RVR >> s/p TEE DCCV 01/2024  (HFrEF) heart failure with reduced ejection fraction EF improved to normal in the past  TTE in 01/2020: EF 35-40 >> TTE in 06/2021: EF 50-55 EF ? with AFib w RVR TTE 01/21/24: EF 30-35, GR 1 DD, normal RVSF, moderate LAE, severe RAE, small pericardial effusion, moderate MR, AV sclerosis TEE 01/22/2024: EF 20-25, mild to moderate MR, mild TR, no LAA clot, PFO TTE 01/30/2024: EF 25-30, global HK, moderately reduced RVSF, mild LAE, mild RAE, moderate MR, mild-moderate AS (mean 9.6, V-max 194.8 cm/s, DI 0.35) Aortic stenosis Mild to mod by TTE 01/2024 Mitral regurgitation Mod MR by TTE 01/2024  Coronary artery Ca2+ Patent Foramen Ovale  Hypertension  Chronic kidney disease stage 3 Hyperlipidemia  Hx of DVT in 11/2022 BPH Parkinson's disease        Discussed the use of AI scribe software for clinical note transcription with the patient, who gave verbal consent to proceed. History of Present Illness Keith [name] is a 83 y.o. male who returns for follow-up of CHF.  He was admitted in early September with acute CHF in the setting of atrial fibrillation with rapid rate.  EF dropped to 30-35.  He underwent TEE guided cardioversion with restoration of normal sinus rhythm.  He was seen in follow-up 02/07/2024.  He was volume overloaded and I increased his bumetanide .  He is brought back for close follow-up.  He is here with his daughter-in-law.  Overall, he notes improved symptoms.  He has not had chest discomfort.  His breathing has improved.  His abdominal distention has also improved.  He has not had syncope.  He has been continuing his medications.    ROS-See HPI    EKG Interpretation Date/Time:  Wednesday February 12 2024  09:59:00 EDT Ventricular Rate:  87 PR Interval:    QRS Duration:  94 QT Interval:  400 QTC Calculation: 481 R Axis:   -29  Text Interpretation: Atrial fibrillation with frequent Premature ventricular complexes vs aberrant conduction Left anterior fasicular block Poor R wave progression Confirmed by Lelon Hamilton [phone] on 02/12/2024 10:22:33 AM   Risk Assessment/Calculations: CHA2DS2-VASc Score = 5   This indicates a 7.2% annual risk of stroke. The patient's score is based upon: CHF History: 1 HTN History: 1 Diabetes History: 0 Stroke History: 0 Vascular Disease History: 1 Age Score: 2 Gender Score: 0           Physical Exam:  VS:  BP 130/80   Pulse 73   Ht 5' 7 (1.702 m)   Wt 187 lb 12.8 oz (85.2 kg)   SpO2 94%   BMI 29.41 kg/m        Wt Readings from Last 3 Encounters:  02/12/24 187 lb 12.8 oz (85.2 kg)  02/07/24 193 lb (87.5 kg)  01/24/24 183 lb 6.8 oz (83.2 kg)    Constitutional:      Appearance: Not in distress.  Neck:     Vascular: No JVR.  Pulmonary:     Breath sounds: No wheezing. No rales.  Cardiovascular:     Irregular rhythm.     Murmurs: There is a grade 2/6 systolic murmur at the URSB.  Edema:  Peripheral edema absent.  Abdominal:     Palpations: Abdomen is soft.       Assessment and Plan:    Assessment & Plan Acute on chronic heart failure with reduced ejection fraction (HFrEF, <= 40%) (HCC) Patient has a previous history of reduced EF. He was recently admitted with atrial fibrillation with RVR and acute CHF. EF was down to 30-35. He underwent TEE guided cardioversion. Repeat echocardiogram last week continued to demonstrate EF 25-30. He has been intolerant to lisinopril and valsartan in the past. GDMT at discharge included Nebivolol , irbesartan  and Farxiga . He did have AKI with diuresis and it was felt that MRA could be added if renal function improves.  At last visit, he was volume overloaded.  I increased his bumetanide .  His weight is  down 6 pounds since last seen.  Overall, his symptoms have improved.  He is NYHA IIb-III. He is back in atrial fibrillation today with controlled rate. However, he will not likely tolerate this for long. We will need to pursue rhythm control.  -Continue bumetanide  1 mg daily, Jardiance  10 mg daily, irbesartan  150 mg daily, Nebivolol  20 mg daily -BMET, magnesium today -Consider spironolactone  if renal function stable -Plan repeat limited echo in 3 months to reassess EF Persistent atrial fibrillation (HCC) He was admitted with atrial fibrillation with rapid ventricular rate. This was complicated by acute CHF. He underwent TEE guided cardioversion. Today, he is back in AFib with controlled rate. I discussed with Dr. Ladona. He will not likely tolerate this for long.  -Start Amiodarone  400 mg once daily x 2 weeks, then 200 mg once daily -Continue Eliquis  2.5 mg twice daily, Nebivolol  20 mg once daily -Follow up 3-4 weeks -Will arrange DCCV if still in AFib at that time.  Stage 3a chronic kidney disease (HCC) Repeat BMET today. Nonrheumatic aortic (valve) stenosis Mild to moderate by echocardiogram in September 2025.  Repeat limited echo will be obtained in 3 months to reassess LV function. Nonrheumatic mitral valve regurgitation Moderate mitral digitation by echocardiogram September 2025.  Repeat limited echo will be obtained in 3 months to reassess LV function Essential hypertension Blood pressure somewhat borderline.  Continue irbesartan  150 mg daily, Nebivolol  20 mg daily.  Consider addition of spironolactone  if renal function and potassium stable.      Dispo:  Return in about 6 weeks (around 03/25/2024) for Routine Follow Up, w/ Glendia Ferrier, PA-C.  Signed, Glendia Ferrier, PA-C

## 2024-02-12 ENCOUNTER — Ambulatory Visit: Attending: Physician Assistant | Admitting: Physician Assistant

## 2024-02-12 ENCOUNTER — Encounter: Payer: Self-pay | Admitting: Physician Assistant

## 2024-02-12 VITALS — BP 130/80 | HR 73 | Ht 67.0 in | Wt 187.8 lb

## 2024-02-12 DIAGNOSIS — I5023 Acute on chronic systolic (congestive) heart failure: Secondary | ICD-10-CM | POA: Insufficient documentation

## 2024-02-12 DIAGNOSIS — N1831 Chronic kidney disease, stage 3a: Secondary | ICD-10-CM | POA: Diagnosis not present

## 2024-02-12 DIAGNOSIS — I4819 Other persistent atrial fibrillation: Secondary | ICD-10-CM | POA: Insufficient documentation

## 2024-02-12 DIAGNOSIS — I350 Nonrheumatic aortic (valve) stenosis: Secondary | ICD-10-CM | POA: Insufficient documentation

## 2024-02-12 DIAGNOSIS — I10 Essential (primary) hypertension: Secondary | ICD-10-CM | POA: Insufficient documentation

## 2024-02-12 DIAGNOSIS — I340 Nonrheumatic mitral (valve) insufficiency: Secondary | ICD-10-CM | POA: Insufficient documentation

## 2024-02-12 MED ORDER — AMIODARONE HCL 200 MG PO TABS: ORAL_TABLET | ORAL | 1 refills | Status: DC

## 2024-02-12 NOTE — Assessment & Plan Note (Signed)
 Blood pressure somewhat borderline.  Continue irbesartan  150 mg daily, Nebivolol  20 mg daily.  Consider addition of spironolactone  if renal function and potassium stable.

## 2024-02-12 NOTE — Patient Instructions (Signed)
 Medication Instructions:  Your physician has recommended you make the following change in your medication:   START Amiodarone  200 mg taking 2 tablets daily for 2 weeks then reduce to 1 daily  *If you need a refill on your cardiac medications before your next appointment, please call your pharmacy*  Lab Work: TODAY:  BMET & MAG  If you have labs (blood work) drawn today and your tests are completely normal, you will receive your results only by: MyChart Message (if you have MyChart) OR A paper copy in the mail If you have any lab test that is abnormal or we need to change your treatment, we will call you to review the results.  Testing/Procedures: Your physician has requested that you have an echocardiogram [place]. Echocardiography is a painless test that uses sound waves to create images of your heart. It provides your doctor with information about the size and shape of your heart and how well your heart's chambers and valves are working. This procedure takes approximately one hour. There are no restrictions for this procedure. Please do NOT wear cologne, perfume, aftershave, or lotions (deodorant is allowed). Please arrive 15 minutes prior to your appointment time.  Please note: We ask at that you not bring children with you during ultrasound (echo/ vascular) testing. Due to room size and safety concerns, children are not allowed in the ultrasound rooms during exams. Our front office staff cannot provide observation of children in our lobby area while testing is being conducted. An adult accompanying a patient to their appointment will only be allowed in the ultrasound room at the discretion of the ultrasound technician under special circumstances. We apologize for any inconvenience.   Follow-Up: At [facility], you and your health needs are our priority.  As part of our continuing mission to provide you with exceptional heart care, our providers are all part of one team.  This  team includes your primary Cardiologist (physician) and Advanced Practice Providers or APPs (Physician Assistants and Nurse Practitioners) who all work together to provide you with the care you need, when you need it.  Your next appointment:   3 week(s)  Provider:   Glendia Ferrier, PA-C          We recommend signing up for the patient portal called MyChart.  Sign up information is provided on this After Visit Summary.  MyChart is used to connect with patients for Virtual Visits (Telemedicine).  Patients are able to view lab/test results, encounter notes, upcoming appointments, etc.  Non-urgent messages can be sent to your provider as well.   To learn more about what you can do with MyChart, go to [email or web address].   Other Instructions

## 2024-02-12 NOTE — Assessment & Plan Note (Signed)
 Mild to moderate by echocardiogram in September 2025.  Repeat limited echo will be obtained in 3 months to reassess LV function.

## 2024-02-12 NOTE — Assessment & Plan Note (Signed)
Repeat BMET today.  

## 2024-02-12 NOTE — Assessment & Plan Note (Signed)
 Moderate mitral digitation by echocardiogram September 2025.  Repeat limited echo will be obtained in 3 months to reassess LV function

## 2024-02-13 ENCOUNTER — Ambulatory Visit: Payer: Self-pay | Admitting: Physician Assistant

## 2024-02-13 DIAGNOSIS — I5020 Unspecified systolic (congestive) heart failure: Secondary | ICD-10-CM

## 2024-02-13 DIAGNOSIS — I480 Paroxysmal atrial fibrillation: Secondary | ICD-10-CM | POA: Diagnosis not present

## 2024-02-13 DIAGNOSIS — M1030 Gout due to renal impairment, unspecified site: Secondary | ICD-10-CM | POA: Diagnosis not present

## 2024-02-13 DIAGNOSIS — I5033 Acute on chronic diastolic (congestive) heart failure: Secondary | ICD-10-CM | POA: Diagnosis not present

## 2024-02-13 DIAGNOSIS — I501 Left ventricular failure: Secondary | ICD-10-CM | POA: Diagnosis not present

## 2024-02-13 DIAGNOSIS — I130 Hypertensive heart and chronic kidney disease with heart failure and stage 1 through stage 4 chronic kidney disease, or unspecified chronic kidney disease: Secondary | ICD-10-CM | POA: Diagnosis not present

## 2024-02-13 DIAGNOSIS — N1832 Chronic kidney disease, stage 3b: Secondary | ICD-10-CM | POA: Diagnosis not present

## 2024-02-13 LAB — BASIC METABOLIC PANEL WITH GFR
BUN/Creatinine Ratio: 12 (ref 10–24)
BUN: 23 mg/dL (ref 8–27)
CO2: 24 mmol/L (ref 20–29)
Calcium: 9.3 mg/dL (ref 8.6–10.2)
Chloride: 103 mmol/L (ref 96–106)
Creatinine, Ser: 1.95 mg/dL — ABNORMAL HIGH (ref 0.76–1.27)
Glucose: 94 mg/dL (ref 70–99)
Potassium: 4 mmol/L (ref 3.5–5.2)
Sodium: 144 mmol/L (ref 134–144)
eGFR: 33 mL/min/1.73 — ABNORMAL LOW (ref >59)

## 2024-02-13 LAB — MAGNESIUM: Magnesium: 2.4 mg/dL — ABNORMAL HIGH (ref 1.6–2.3)

## 2024-02-13 NOTE — Progress Notes (Signed)
 Pt has been made aware of normal result and verbalized understanding.  jw

## 2024-02-18 ENCOUNTER — Telehealth: Payer: Self-pay | Admitting: Physician Assistant

## 2024-02-18 MED ORDER — EMPAGLIFLOZIN 10 MG PO TABS: 10.0000 mg | ORAL_TABLET | Freq: Every day | ORAL | 3 refills | Status: AC

## 2024-02-18 NOTE — Telephone Encounter (Signed)
*  STAT* If patient is at the pharmacy, call can be transferred to refill team.   1. Which medications need to be refilled? (please list name of each medication and dose if known)   empagliflozin  (JARDIANCE ) 10 MG TABS tablet     2. Would you like to learn more about the convenience, safety, & potential cost savings by using the [facility]? no   3. Are you open to using the [facility] (Type [facility].  ). no   4. Which pharmacy/location (including street and city if local pharmacy) is medication to be sent to? [id number] - [place], [place] - [address]       5. Do they need a 30 day or 90 day supply? 30 day supply

## 2024-02-18 NOTE — Telephone Encounter (Signed)
 Pt's medication was sent to pt's pharmacy as requested. Confirmation received.

## 2024-02-19 DIAGNOSIS — I480 Paroxysmal atrial fibrillation: Secondary | ICD-10-CM | POA: Diagnosis not present

## 2024-02-19 DIAGNOSIS — N1832 Chronic kidney disease, stage 3b: Secondary | ICD-10-CM | POA: Diagnosis not present

## 2024-02-19 DIAGNOSIS — I5033 Acute on chronic diastolic (congestive) heart failure: Secondary | ICD-10-CM | POA: Diagnosis not present

## 2024-02-19 DIAGNOSIS — M1030 Gout due to renal impairment, unspecified site: Secondary | ICD-10-CM | POA: Diagnosis not present

## 2024-02-19 DIAGNOSIS — I130 Hypertensive heart and chronic kidney disease with heart failure and stage 1 through stage 4 chronic kidney disease, or unspecified chronic kidney disease: Secondary | ICD-10-CM | POA: Diagnosis not present

## 2024-02-19 DIAGNOSIS — I501 Left ventricular failure: Secondary | ICD-10-CM | POA: Diagnosis not present

## 2024-02-20 DIAGNOSIS — N1832 Chronic kidney disease, stage 3b: Secondary | ICD-10-CM | POA: Diagnosis not present

## 2024-02-20 DIAGNOSIS — I130 Hypertensive heart and chronic kidney disease with heart failure and stage 1 through stage 4 chronic kidney disease, or unspecified chronic kidney disease: Secondary | ICD-10-CM | POA: Diagnosis not present

## 2024-02-20 DIAGNOSIS — I480 Paroxysmal atrial fibrillation: Secondary | ICD-10-CM | POA: Diagnosis not present

## 2024-02-20 DIAGNOSIS — M1030 Gout due to renal impairment, unspecified site: Secondary | ICD-10-CM | POA: Diagnosis not present

## 2024-02-20 DIAGNOSIS — I501 Left ventricular failure: Secondary | ICD-10-CM | POA: Diagnosis not present

## 2024-02-20 DIAGNOSIS — I5033 Acute on chronic diastolic (congestive) heart failure: Secondary | ICD-10-CM | POA: Diagnosis not present

## 2024-02-25 DIAGNOSIS — G43909 Migraine, unspecified, not intractable, without status migrainosus: Secondary | ICD-10-CM | POA: Diagnosis not present

## 2024-02-25 DIAGNOSIS — I088 Other rheumatic multiple valve diseases: Secondary | ICD-10-CM | POA: Diagnosis not present

## 2024-02-25 DIAGNOSIS — J309 Allergic rhinitis, unspecified: Secondary | ICD-10-CM | POA: Diagnosis not present

## 2024-02-25 DIAGNOSIS — K219 Gastro-esophageal reflux disease without esophagitis: Secondary | ICD-10-CM | POA: Diagnosis not present

## 2024-02-25 DIAGNOSIS — M160 Bilateral primary osteoarthritis of hip: Secondary | ICD-10-CM | POA: Diagnosis not present

## 2024-02-25 DIAGNOSIS — I5033 Acute on chronic diastolic (congestive) heart failure: Secondary | ICD-10-CM | POA: Diagnosis not present

## 2024-02-25 DIAGNOSIS — R911 Solitary pulmonary nodule: Secondary | ICD-10-CM | POA: Diagnosis not present

## 2024-02-25 DIAGNOSIS — N400 Enlarged prostate without lower urinary tract symptoms: Secondary | ICD-10-CM | POA: Diagnosis not present

## 2024-02-25 DIAGNOSIS — E7800 Pure hypercholesterolemia, unspecified: Secondary | ICD-10-CM | POA: Diagnosis not present

## 2024-02-25 DIAGNOSIS — J9811 Atelectasis: Secondary | ICD-10-CM | POA: Diagnosis not present

## 2024-02-25 DIAGNOSIS — I2510 Atherosclerotic heart disease of native coronary artery without angina pectoris: Secondary | ICD-10-CM | POA: Diagnosis not present

## 2024-02-25 DIAGNOSIS — I700 Atherosclerosis of aorta: Secondary | ICD-10-CM | POA: Diagnosis not present

## 2024-02-25 DIAGNOSIS — I480 Paroxysmal atrial fibrillation: Secondary | ICD-10-CM | POA: Diagnosis not present

## 2024-02-25 DIAGNOSIS — Z6829 Body mass index (BMI) 29.0-29.9, adult: Secondary | ICD-10-CM | POA: Diagnosis not present

## 2024-02-25 DIAGNOSIS — I130 Hypertensive heart and chronic kidney disease with heart failure and stage 1 through stage 4 chronic kidney disease, or unspecified chronic kidney disease: Secondary | ICD-10-CM | POA: Diagnosis not present

## 2024-02-25 DIAGNOSIS — M19012 Primary osteoarthritis, left shoulder: Secondary | ICD-10-CM | POA: Diagnosis not present

## 2024-02-25 DIAGNOSIS — G20A1 Parkinson's disease without dyskinesia, without mention of fluctuations: Secondary | ICD-10-CM | POA: Diagnosis not present

## 2024-02-25 DIAGNOSIS — Z7901 Long term (current) use of anticoagulants: Secondary | ICD-10-CM | POA: Diagnosis not present

## 2024-02-25 DIAGNOSIS — N1832 Chronic kidney disease, stage 3b: Secondary | ICD-10-CM | POA: Diagnosis not present

## 2024-02-25 DIAGNOSIS — I501 Left ventricular failure: Secondary | ICD-10-CM | POA: Diagnosis not present

## 2024-02-25 DIAGNOSIS — Q6101 Congenital single renal cyst: Secondary | ICD-10-CM | POA: Diagnosis not present

## 2024-02-25 DIAGNOSIS — M1030 Gout due to renal impairment, unspecified site: Secondary | ICD-10-CM | POA: Diagnosis not present

## 2024-02-25 DIAGNOSIS — N200 Calculus of kidney: Secondary | ICD-10-CM | POA: Diagnosis not present

## 2024-02-25 DIAGNOSIS — Z7984 Long term (current) use of oral hypoglycemic drugs: Secondary | ICD-10-CM | POA: Diagnosis not present

## 2024-02-27 DIAGNOSIS — I5033 Acute on chronic diastolic (congestive) heart failure: Secondary | ICD-10-CM | POA: Diagnosis not present

## 2024-02-27 DIAGNOSIS — I130 Hypertensive heart and chronic kidney disease with heart failure and stage 1 through stage 4 chronic kidney disease, or unspecified chronic kidney disease: Secondary | ICD-10-CM | POA: Diagnosis not present

## 2024-02-27 DIAGNOSIS — M1030 Gout due to renal impairment, unspecified site: Secondary | ICD-10-CM | POA: Diagnosis not present

## 2024-02-27 DIAGNOSIS — I501 Left ventricular failure: Secondary | ICD-10-CM | POA: Diagnosis not present

## 2024-02-27 DIAGNOSIS — N1832 Chronic kidney disease, stage 3b: Secondary | ICD-10-CM | POA: Diagnosis not present

## 2024-02-27 DIAGNOSIS — I480 Paroxysmal atrial fibrillation: Secondary | ICD-10-CM | POA: Diagnosis not present

## 2024-02-28 DIAGNOSIS — I480 Paroxysmal atrial fibrillation: Secondary | ICD-10-CM | POA: Diagnosis not present

## 2024-02-28 DIAGNOSIS — I130 Hypertensive heart and chronic kidney disease with heart failure and stage 1 through stage 4 chronic kidney disease, or unspecified chronic kidney disease: Secondary | ICD-10-CM | POA: Diagnosis not present

## 2024-02-28 DIAGNOSIS — M1030 Gout due to renal impairment, unspecified site: Secondary | ICD-10-CM | POA: Diagnosis not present

## 2024-02-28 DIAGNOSIS — I501 Left ventricular failure: Secondary | ICD-10-CM | POA: Diagnosis not present

## 2024-02-28 DIAGNOSIS — N1832 Chronic kidney disease, stage 3b: Secondary | ICD-10-CM | POA: Diagnosis not present

## 2024-02-28 DIAGNOSIS — I5033 Acute on chronic diastolic (congestive) heart failure: Secondary | ICD-10-CM | POA: Diagnosis not present

## 2024-03-02 NOTE — H&P (View-Only) (Signed)
 OFFICE NOTE:    Date:  03/03/2024  ID:  Keith [name] Marker, DOB 07-17-1940, MRN [id number] PCP: Gerome Brunet, DO  [facility] Providers Cardiologist:  Gordy Bergamo, MD       Persistent atrial fibrillation  Admitted with acute CHF, AF w RVR >> s/p TEE DCCV 01/2024  (HFrEF) heart failure with reduced ejection fraction EF improved to normal in the past  TTE in 01/2020: EF 35-40 >> TTE in 06/2021: EF 50-55 EF ? with AFib w RVR TTE 01/21/24: EF 30-35, GR 1 DD, normal RVSF, moderate LAE, severe RAE, small pericardial effusion, moderate MR, AV sclerosis TEE 01/22/2024: EF 20-25, mild to moderate MR, mild TR, no LAA clot, PFO TTE 01/30/2024: EF 25-30, global HK, moderately reduced RVSF, mild LAE, mild RAE, moderate MR, mild-moderate AS (mean 9.6, V-max 194.8 cm/s, DI 0.35) Aortic stenosis Mild to mod by TTE 01/2024 Mitral regurgitation Mod MR by TTE 01/2024  Coronary artery Ca2+ Patent Foramen Ovale  Hypertension  Chronic kidney disease stage 3 Hyperlipidemia  Hx of DVT in 11/2022 BPH Parkinson's disease        Discussed the use of AI scribe software for clinical note transcription with the patient, who gave verbal consent to proceed. History of Present Illness Keith [name] is a 83 y.o. male who returns for follow up of CHF, AFib. He was admitted in early September with acute CHF in the setting of atrial fibrillation with rapid rate. EF dropped to 30-35. He underwent TEE guided cardioversion with restoration of normal sinus rhythm. I saw him last on 02/12/24. He was back in AFib. I d/w Dr. Bergamo and we placed him on Amiodarone  with plans to repeat DCCV if he remains in AFib.   He feels good overall, with no noticeable symptoms of being in or out of atrial fibrillation. No chest pain, syncope, or bleeding. He notes increased urination but no swelling. His weight has fluctuated, with a recent weight of 177 pounds at home, though it varies. He is currently taking Eliquis  without any missed  doses since his hospital stay.      Review of Systems  Constitutional: Negative for fever.  Respiratory:  Positive for cough (chronic).   Gastrointestinal:  Negative for hematochezia.  Genitourinary:  Negative for hematuria.  -See HPI    Studies Reviewed:  EKG Interpretation Date/Time:  Tuesday March 03 2024 14:48:30 EDT Ventricular Rate:  52 PR Interval:    QRS Duration:  100 QT Interval:  428 QTC Calculation: 398 R Axis:   -18  Text Interpretation: Atrial fibrillation with slow ventricular response with ventricular escape complexes Low voltage QRS Cannot rule out Anterior infarct Confirmed by Lelon Hamilton [phone]) on 03/03/2024 2:51:50 PM   Results LABS Potassium: 4 (02/12/2024) Creatinine: 1.95 (02/12/2024) EGFR: 33 (02/12/2024) Magnesium: 2.4 (02/12/2024) Hemoglobin: 13.4 (02/07/2024) Platelet count: 172,000 (02/07/2024)   Risk Assessment/Calculations: CHA2DS2-VASc Score = 5   This indicates a 7.2% annual risk of stroke. The patient's score is based upon: CHF History: 1 HTN History: 1 Diabetes History: 0 Stroke History: 0 Vascular Disease History: 1 Age Score: 2 Gender Score: 0             Physical Exam:  VS:  BP 110/70 (BP Location: Right Arm, Patient Position: Sitting, Cuff Size: Normal)   Pulse (!) 52   Ht 5' 7 (1.702 m)   Wt 188 lb (85.3 kg)   BMI 29.44 kg/m        Wt Readings from Last 3  Encounters:  03/03/24 188 lb (85.3 kg)  02/12/24 187 lb 12.8 oz (85.2 kg)  02/07/24 193 lb (87.5 kg)    Constitutional:      Appearance: Healthy appearance. Not in distress.  Neck:     Vascular: No JVR. JVD normal.  Pulmonary:     Breath sounds: Normal breath sounds. No wheezing. No rales.  Cardiovascular:     Bradycardia present. Irregular rhythm.     Murmurs: There is a grade 2/6 systolic murmur at the URSB.  Edema:    Peripheral edema present.    Pretibial: bilateral trace edema of the pretibial area. Abdominal:     Palpations: Abdomen is soft.        Assessment and Plan:    Assessment & Plan Persistent atrial fibrillation (HCC) He underwent TEE DCCV in 01/2024 but had return of AFib when I saw him last. He will not be able to tolerate atrial fibrillation for long and we opted for rhythm control. He is now on Amiodarone . He remains in atrial fibrillation. VR is now slow. He is not symptomatic with this. He has not missed a dose of Eliquis . He is on Eliquis  2.5 mg due to age, SCr >/= 1.5.  -Arrange DCCV -BMET, CBC today  -Continue Amiodarone  200 mg once daily  -Decrease Nebivolol  to 10 mg once daily  -Continue Eliquis  2.5 mg twice daily  -Hold Jardiance  72 hours prior to DCCV -Follow up post DCCV. If he has ERAF, refer to EP HFrEF (heart failure with reduced ejection fraction) (HCC) He has a hx of reduced EF that improved to normal. He was admitted in 01/2024 with acute CHF and AFib. His EF was again reduced. Last TTE in 01/2024 with EF 25-30. Volume status is currently stable. He is NYHA IIb. -Continue Bumetanide  1 mg once daily, Jardiance  10 mg once daily, Irbesartan  150 mg once daily  -Reduce Nebivolol  to 10 mg once daily  -Follow up TTE scheduled 05/07/24 to reassess LVEF Stage 3a chronic kidney disease (HCC) Recent SCr stable.  Nonrheumatic aortic (valve) stenosis Mild to moderate by echocardiogram in September 2025.  Repeat TTE is due in Dec 2025 to recheck EF. Nonrheumatic mitral valve regurgitation Moderate mitral digitation by echocardiogram September 2025.  Repeat limited TTE due in 04/2024 as noted.  Essential hypertension BP stable.  -Decrease Nebivolol  to 10 mg once daily as noted for bradycardia. Continue Irbesartan  150 mg once daily.      Informed Consent   Shared Decision Making/Informed Consent The risks (stroke, cardiac arrhythmias rarely resulting in the need for a temporary or permanent pacemaker, skin irritation or burns and complications associated with conscious sedation including aspiration, arrhythmia,  respiratory failure and death), benefits (restoration of normal sinus rhythm) and alternatives of a direct current cardioversion were explained in detail to Mr. Southwell and he agrees to proceed.       Dispo:  Return for Post Procedure Follow Up, w/ Glendia Ferrier, PA-C.  Signed, Glendia Ferrier, PA-C

## 2024-03-02 NOTE — Assessment & Plan Note (Signed)
 He has a hx of reduced EF that improved to normal. He was admitted in 01/2024 with acute CHF and AFib. His EF was again reduced. Last TTE in 01/2024 with EF 25-30. Volume status is currently stable. He is NYHA IIb. -Continue Bumetanide  1 mg once daily, Jardiance  10 mg once daily, Irbesartan  150 mg once daily  -Reduce Nebivolol  to 10 mg once daily  -Follow up TTE scheduled 05/07/24 to reassess LVEF

## 2024-03-02 NOTE — Assessment & Plan Note (Signed)
 He underwent TEE DCCV in 01/2024 but had return of AFib when I saw him last. He will not be able to tolerate atrial fibrillation for long and we opted for rhythm control. He is now on Amiodarone . He remains in atrial fibrillation. VR is now slow. He is not symptomatic with this. He has not missed a dose of Eliquis . He is on Eliquis  2.5 mg due to age, SCr >/= 1.5.  -Arrange DCCV -BMET, CBC today  -Continue Amiodarone  200 mg once daily  -Decrease Nebivolol  to 10 mg once daily  -Continue Eliquis  2.5 mg twice daily  -Hold Jardiance  72 hours prior to DCCV -Follow up post DCCV. If he has ERAF, refer to EP

## 2024-03-02 NOTE — Assessment & Plan Note (Signed)
 Moderate mitral digitation by echocardiogram September 2025.  Repeat limited echo will be obtained in 3 months to reassess LV function***

## 2024-03-02 NOTE — Assessment & Plan Note (Signed)
 Mild to moderate by echocardiogram in September 2025.  Repeat TTE is due in Dec 2025 to recheck EF.

## 2024-03-02 NOTE — Progress Notes (Unsigned)
 OFFICE NOTE:    Date:  03/03/2024  ID:  Keith [name] Marker, DOB 07-17-1940, MRN [id number] PCP: Gerome Brunet, DO  [facility] Providers Cardiologist:  Gordy Bergamo, MD       Persistent atrial fibrillation  Admitted with acute CHF, AF w RVR >> s/p TEE DCCV 01/2024  (HFrEF) heart failure with reduced ejection fraction EF improved to normal in the past  TTE in 01/2020: EF 35-40 >> TTE in 06/2021: EF 50-55 EF ? with AFib w RVR TTE 01/21/24: EF 30-35, GR 1 DD, normal RVSF, moderate LAE, severe RAE, small pericardial effusion, moderate MR, AV sclerosis TEE 01/22/2024: EF 20-25, mild to moderate MR, mild TR, no LAA clot, PFO TTE 01/30/2024: EF 25-30, global HK, moderately reduced RVSF, mild LAE, mild RAE, moderate MR, mild-moderate AS (mean 9.6, V-max 194.8 cm/s, DI 0.35) Aortic stenosis Mild to mod by TTE 01/2024 Mitral regurgitation Mod MR by TTE 01/2024  Coronary artery Ca2+ Patent Foramen Ovale  Hypertension  Chronic kidney disease stage 3 Hyperlipidemia  Hx of DVT in 11/2022 BPH Parkinson's disease        Discussed the use of AI scribe software for clinical note transcription with the patient, who gave verbal consent to proceed. History of Present Illness Keith [name] is a 83 y.o. male who returns for follow up of CHF, AFib. He was admitted in early September with acute CHF in the setting of atrial fibrillation with rapid rate. EF dropped to 30-35. He underwent TEE guided cardioversion with restoration of normal sinus rhythm. I saw him last on 02/12/24. He was back in AFib. I d/w Dr. Bergamo and we placed him on Amiodarone  with plans to repeat DCCV if he remains in AFib.   He feels good overall, with no noticeable symptoms of being in or out of atrial fibrillation. No chest pain, syncope, or bleeding. He notes increased urination but no swelling. His weight has fluctuated, with a recent weight of 177 pounds at home, though it varies. He is currently taking Eliquis  without any missed  doses since his hospital stay.      Review of Systems  Constitutional: Negative for fever.  Respiratory:  Positive for cough (chronic).   Gastrointestinal:  Negative for hematochezia.  Genitourinary:  Negative for hematuria.  -See HPI    Studies Reviewed:  EKG Interpretation Date/Time:  Tuesday March 03 2024 14:48:30 EDT Ventricular Rate:  52 PR Interval:    QRS Duration:  100 QT Interval:  428 QTC Calculation: 398 R Axis:   -18  Text Interpretation: Atrial fibrillation with slow ventricular response with ventricular escape complexes Low voltage QRS Cannot rule out Anterior infarct Confirmed by Lelon Hamilton [phone]) on 03/03/2024 2:51:50 PM   Results LABS Potassium: 4 (02/12/2024) Creatinine: 1.95 (02/12/2024) EGFR: 33 (02/12/2024) Magnesium: 2.4 (02/12/2024) Hemoglobin: 13.4 (02/07/2024) Platelet count: 172,000 (02/07/2024)   Risk Assessment/Calculations: CHA2DS2-VASc Score = 5   This indicates a 7.2% annual risk of stroke. The patient's score is based upon: CHF History: 1 HTN History: 1 Diabetes History: 0 Stroke History: 0 Vascular Disease History: 1 Age Score: 2 Gender Score: 0             Physical Exam:  VS:  BP 110/70 (BP Location: Right Arm, Patient Position: Sitting, Cuff Size: Normal)   Pulse (!) 52   Ht 5' 7 (1.702 m)   Wt 188 lb (85.3 kg)   BMI 29.44 kg/m        Wt Readings from Last 3  Encounters:  03/03/24 188 lb (85.3 kg)  02/12/24 187 lb 12.8 oz (85.2 kg)  02/07/24 193 lb (87.5 kg)    Constitutional:      Appearance: Healthy appearance. Not in distress.  Neck:     Vascular: No JVR. JVD normal.  Pulmonary:     Breath sounds: Normal breath sounds. No wheezing. No rales.  Cardiovascular:     Bradycardia present. Irregular rhythm.     Murmurs: There is a grade 2/6 systolic murmur at the URSB.  Edema:    Peripheral edema present.    Pretibial: bilateral trace edema of the pretibial area. Abdominal:     Palpations: Abdomen is soft.        Assessment and Plan:    Assessment & Plan Persistent atrial fibrillation (HCC) He underwent TEE DCCV in 01/2024 but had return of AFib when I saw him last. He will not be able to tolerate atrial fibrillation for long and we opted for rhythm control. He is now on Amiodarone . He remains in atrial fibrillation. VR is now slow. He is not symptomatic with this. He has not missed a dose of Eliquis . He is on Eliquis  2.5 mg due to age, SCr >/= 1.5.  -Arrange DCCV -BMET, CBC today  -Continue Amiodarone  200 mg once daily  -Decrease Nebivolol  to 10 mg once daily  -Continue Eliquis  2.5 mg twice daily  -Hold Jardiance  72 hours prior to DCCV -Follow up post DCCV. If he has ERAF, refer to EP HFrEF (heart failure with reduced ejection fraction) (HCC) He has a hx of reduced EF that improved to normal. He was admitted in 01/2024 with acute CHF and AFib. His EF was again reduced. Last TTE in 01/2024 with EF 25-30. Volume status is currently stable. He is NYHA IIb. -Continue Bumetanide  1 mg once daily, Jardiance  10 mg once daily, Irbesartan  150 mg once daily  -Reduce Nebivolol  to 10 mg once daily  -Follow up TTE scheduled 05/07/24 to reassess LVEF Stage 3a chronic kidney disease (HCC) Recent SCr stable.  Nonrheumatic aortic (valve) stenosis Mild to moderate by echocardiogram in September 2025.  Repeat TTE is due in Dec 2025 to recheck EF. Nonrheumatic mitral valve regurgitation Moderate mitral digitation by echocardiogram September 2025.  Repeat limited TTE due in 04/2024 as noted.  Essential hypertension BP stable.  -Decrease Nebivolol  to 10 mg once daily as noted for bradycardia. Continue Irbesartan  150 mg once daily.      Informed Consent   Shared Decision Making/Informed Consent The risks (stroke, cardiac arrhythmias rarely resulting in the need for a temporary or permanent pacemaker, skin irritation or burns and complications associated with conscious sedation including aspiration, arrhythmia,  respiratory failure and death), benefits (restoration of normal sinus rhythm) and alternatives of a direct current cardioversion were explained in detail to Mr. Southwell and he agrees to proceed.       Dispo:  Return for Post Procedure Follow Up, w/ Glendia Ferrier, PA-C.  Signed, Glendia Ferrier, PA-C

## 2024-03-03 ENCOUNTER — Ambulatory Visit: Attending: Physician Assistant | Admitting: Physician Assistant

## 2024-03-03 ENCOUNTER — Encounter: Payer: Self-pay | Admitting: Physician Assistant

## 2024-03-03 VITALS — BP 110/70 | HR 52 | Ht 67.0 in | Wt 188.0 lb

## 2024-03-03 DIAGNOSIS — I10 Essential (primary) hypertension: Secondary | ICD-10-CM | POA: Insufficient documentation

## 2024-03-03 DIAGNOSIS — I130 Hypertensive heart and chronic kidney disease with heart failure and stage 1 through stage 4 chronic kidney disease, or unspecified chronic kidney disease: Secondary | ICD-10-CM | POA: Diagnosis not present

## 2024-03-03 DIAGNOSIS — I350 Nonrheumatic aortic (valve) stenosis: Secondary | ICD-10-CM | POA: Insufficient documentation

## 2024-03-03 DIAGNOSIS — I5020 Unspecified systolic (congestive) heart failure: Secondary | ICD-10-CM | POA: Insufficient documentation

## 2024-03-03 DIAGNOSIS — I501 Left ventricular failure: Secondary | ICD-10-CM | POA: Diagnosis not present

## 2024-03-03 DIAGNOSIS — N1831 Chronic kidney disease, stage 3a: Secondary | ICD-10-CM | POA: Diagnosis not present

## 2024-03-03 DIAGNOSIS — I4819 Other persistent atrial fibrillation: Secondary | ICD-10-CM | POA: Insufficient documentation

## 2024-03-03 DIAGNOSIS — I5033 Acute on chronic diastolic (congestive) heart failure: Secondary | ICD-10-CM | POA: Diagnosis not present

## 2024-03-03 DIAGNOSIS — N1832 Chronic kidney disease, stage 3b: Secondary | ICD-10-CM | POA: Diagnosis not present

## 2024-03-03 DIAGNOSIS — I340 Nonrheumatic mitral (valve) insufficiency: Secondary | ICD-10-CM | POA: Diagnosis not present

## 2024-03-03 DIAGNOSIS — M1030 Gout due to renal impairment, unspecified site: Secondary | ICD-10-CM | POA: Diagnosis not present

## 2024-03-03 DIAGNOSIS — I480 Paroxysmal atrial fibrillation: Secondary | ICD-10-CM | POA: Diagnosis not present

## 2024-03-03 MED ORDER — NEBIVOLOL HCL 10 MG PO TABS: 10.0000 mg | ORAL_TABLET | Freq: Every day | ORAL | 11 refills | Status: DC

## 2024-03-03 NOTE — Patient Instructions (Signed)
 Medication Instructions:  DECREASE Bystolic  (nebivolol ) to 10mg  Take 1 tablet once a day  *If you need a refill on your cardiac medications before your next appointment, please call your pharmacy*  Lab Work: TODAY-BMET & CBC If you have labs (blood work) drawn today and your tests are completely normal, you will receive your results only by: MyChart Message (if you have MyChart) OR A paper copy in the mail If you have any lab test that is abnormal or we need to change your treatment, we will call you to review the results.  Testing/Procedures: Your physician has recommended that you have a Cardioversion (DCCV). Electrical Cardioversion uses a jolt of electricity to your heart either through paddles or wired patches attached to your chest. This is a controlled, usually prescheduled, procedure. Defibrillation is done under light anesthesia in the hospital, and you usually go home the day of the procedure. This is done to get your heart back into a normal rhythm. You are not awake for the procedure. Please see the instruction sheet given to you today.   Follow-Up: At [facility], you and your health needs are our priority.  As part of our continuing mission to provide you with exceptional heart care, our providers are all part of one team.  This team includes your primary Cardiologist (physician) and Advanced Practice Providers or APPs (Physician Assistants and Nurse Practitioners) who all work together to provide you with the care you need, when you need it.  Your next appointment:   2 week(s) (after Cardioversion scheduled on 03/10/24)  Provider:   Glendia Ferrier, PA-C         We recommend signing up for the patient portal called MyChart.  Sign up information is provided on this After Visit Summary.  MyChart is used to connect with patients for Virtual Visits (Telemedicine).  Patients are able to view lab/test results, encounter notes, upcoming appointments, etc.  Non-urgent messages  can be sent to your provider as well.   To learn more about what you can do with MyChart, go to [email or web address].   Other Instructions     Dear Keith [name]   You are scheduled for a Cardioversion on Tuesday, October 21 with Dr. Wilbert Bihari.  Please arrive at the [facility] (Main Entrance A) at [facility]: [address] [place], [place] [address] at 9:00 AM (This time is 1 hour(s) before your procedure to ensure your preparation).   Free valet parking service is available. You will check in at ADMITTING.   *Please Note: You will receive a call the day before your procedure to confirm the appointment time. That time may have changed from the original time based on the schedule for that day.*   DIET:  Nothing to eat or drink after midnight except a sip of water with medications (see medication instructions below)  MEDICATION INSTRUCTIONS: !!IF ANY NEW MEDICATIONS ARE STARTED AFTER TODAY, PLEASE NOTIFY YOUR PROVIDER AS SOON AS POSSIBLE!!   HOLD: Empagliflozin  (Jardiance ) for 3 days prior to the procedure. Last dose on Friday, October 17.  Continue taking your anticoagulant (blood thinner): Apixaban  (Eliquis ).  You will need to continue this after your procedure until you are told by your provider that it is safe to stop.    HOLD BUMETANIDE  ON THE MORNING OF YOUR PROCEDURE  LABS:   Come to the lab at the [facility] ([address], [place], 1st Floor) between the hours of 8:00  am and 4:30 pm. You do NOT have to be fasting.  FYI:  For your safety, and to allow us  to monitor your vital signs accurately during the surgery/procedure we request: If you have artificial nails, gel coating, SNS etc, please have those removed prior to your surgery/procedure. Not having the nail coverings /polish removed may result in cancellation or delay of your surgery/procedure.  Your support person will be asked to wait in the waiting room  during your procedure.  It is OK to have someone drop you off and come back when you are ready to be discharged.  You cannot drive after the procedure and will need someone to drive you home.  Bring your insurance cards.  *Special Note: Every effort is made to have your procedure done on time. Occasionally there are emergencies that occur at the hospital that may cause delays. Please be patient if a delay does occur.

## 2024-03-03 NOTE — Assessment & Plan Note (Signed)
 BP stable.  -Decrease Nebivolol  to 10 mg once daily as noted for bradycardia. Continue Irbesartan  150 mg once daily.

## 2024-03-03 NOTE — Assessment & Plan Note (Signed)
 Recent SCr stable.

## 2024-03-04 ENCOUNTER — Ambulatory Visit: Admitting: Physician Assistant

## 2024-03-04 ENCOUNTER — Ambulatory Visit: Payer: Self-pay | Admitting: Physician Assistant

## 2024-03-04 ENCOUNTER — Other Ambulatory Visit: Payer: Self-pay | Admitting: Physician Assistant

## 2024-03-04 DIAGNOSIS — I5020 Unspecified systolic (congestive) heart failure: Secondary | ICD-10-CM

## 2024-03-04 DIAGNOSIS — N1831 Chronic kidney disease, stage 3a: Secondary | ICD-10-CM

## 2024-03-04 LAB — BASIC METABOLIC PANEL WITH GFR
BUN/Creatinine Ratio: 14 (ref 10–24)
BUN: 33 mg/dL — AB (ref 8–27)
CO2: 27 mmol/L (ref 20–29)
Calcium: 9 mg/dL (ref 8.6–10.2)
Chloride: 102 mmol/L (ref 96–106)
Creatinine, Ser: 2.41 mg/dL — AB (ref 0.76–1.27)
Glucose: 90 mg/dL (ref 70–99)
Potassium: 4.2 mmol/L (ref 3.5–5.2)
Sodium: 143 mmol/L (ref 134–144)
eGFR: 26 mL/min/1.73 — AB (ref >59)

## 2024-03-04 LAB — CBC
Hematocrit: 45 % (ref 37.5–51.0)
Hemoglobin: 14.7 g/dL (ref 13.0–17.7)
MCH: 29.6 pg (ref 26.6–33.0)
MCHC: 32.7 g/dL (ref 31.5–35.7)
MCV: 91 fL (ref 79–97)
Platelets: 166 x10E3/uL (ref 150–450)
RBC: 4.96 x10E6/uL (ref 4.14–5.80)
RDW: 14 % (ref 11.6–15.4)
WBC: 9.2 x10E3/uL (ref 3.4–10.8)

## 2024-03-04 MED ORDER — BUMETANIDE 0.5 MG PO TABS: ORAL_TABLET | ORAL | 3 refills | Status: DC

## 2024-03-05 DIAGNOSIS — N1832 Chronic kidney disease, stage 3b: Secondary | ICD-10-CM | POA: Diagnosis not present

## 2024-03-05 DIAGNOSIS — I480 Paroxysmal atrial fibrillation: Secondary | ICD-10-CM | POA: Diagnosis not present

## 2024-03-05 DIAGNOSIS — M1030 Gout due to renal impairment, unspecified site: Secondary | ICD-10-CM | POA: Diagnosis not present

## 2024-03-05 DIAGNOSIS — I501 Left ventricular failure: Secondary | ICD-10-CM | POA: Diagnosis not present

## 2024-03-05 DIAGNOSIS — I130 Hypertensive heart and chronic kidney disease with heart failure and stage 1 through stage 4 chronic kidney disease, or unspecified chronic kidney disease: Secondary | ICD-10-CM | POA: Diagnosis not present

## 2024-03-05 DIAGNOSIS — I5033 Acute on chronic diastolic (congestive) heart failure: Secondary | ICD-10-CM | POA: Diagnosis not present

## 2024-03-06 DIAGNOSIS — I130 Hypertensive heart and chronic kidney disease with heart failure and stage 1 through stage 4 chronic kidney disease, or unspecified chronic kidney disease: Secondary | ICD-10-CM | POA: Diagnosis not present

## 2024-03-06 DIAGNOSIS — N1832 Chronic kidney disease, stage 3b: Secondary | ICD-10-CM | POA: Diagnosis not present

## 2024-03-06 DIAGNOSIS — I480 Paroxysmal atrial fibrillation: Secondary | ICD-10-CM | POA: Diagnosis not present

## 2024-03-06 DIAGNOSIS — I501 Left ventricular failure: Secondary | ICD-10-CM | POA: Diagnosis not present

## 2024-03-06 DIAGNOSIS — M1030 Gout due to renal impairment, unspecified site: Secondary | ICD-10-CM | POA: Diagnosis not present

## 2024-03-06 DIAGNOSIS — I5033 Acute on chronic diastolic (congestive) heart failure: Secondary | ICD-10-CM | POA: Diagnosis not present

## 2024-03-09 DIAGNOSIS — I501 Left ventricular failure: Secondary | ICD-10-CM | POA: Diagnosis not present

## 2024-03-09 DIAGNOSIS — N1832 Chronic kidney disease, stage 3b: Secondary | ICD-10-CM | POA: Diagnosis not present

## 2024-03-09 DIAGNOSIS — I5033 Acute on chronic diastolic (congestive) heart failure: Secondary | ICD-10-CM | POA: Diagnosis not present

## 2024-03-09 DIAGNOSIS — M1030 Gout due to renal impairment, unspecified site: Secondary | ICD-10-CM | POA: Diagnosis not present

## 2024-03-09 DIAGNOSIS — I130 Hypertensive heart and chronic kidney disease with heart failure and stage 1 through stage 4 chronic kidney disease, or unspecified chronic kidney disease: Secondary | ICD-10-CM | POA: Diagnosis not present

## 2024-03-09 DIAGNOSIS — I480 Paroxysmal atrial fibrillation: Secondary | ICD-10-CM | POA: Diagnosis not present

## 2024-03-09 NOTE — Progress Notes (Signed)
 Called patient with pre-procedure instructions for tomorrow. Voice message left for patient to return call.

## 2024-03-09 NOTE — Progress Notes (Signed)
 Sueanne, patient's daughter returned phone call. This RN gave following instructions   Time to arrive for procedure. 0830 Remain NPO past midnight.  Must have a ride home and a responsible adult to remain with them for 24 hours post procedure.  Confirmed blood thinner. Eliquis  Confirmed no breaks in taking blood thinner for 3+ weeks prior to procedure. Confirmed patient stopped all GLP-1s and GLP-2s for at least one week before procedure. Jardiance  10/17

## 2024-03-10 ENCOUNTER — Other Ambulatory Visit: Payer: Self-pay

## 2024-03-10 ENCOUNTER — Ambulatory Visit (HOSPITAL_COMMUNITY): Admitting: Anesthesiology

## 2024-03-10 ENCOUNTER — Ambulatory Visit (HOSPITAL_COMMUNITY)
Admission: RE | Admit: 2024-03-10 | Discharge: 2024-03-10 | Disposition: A | Discharge status: Home / Self Care | Attending: Cardiology | Admitting: Cardiology

## 2024-03-10 ENCOUNTER — Encounter (HOSPITAL_COMMUNITY)
Admission: RE | Disposition: A | Discharge status: Home / Self Care | Payer: Self-pay | Source: Home / Self Care | Attending: Cardiology

## 2024-03-10 DIAGNOSIS — Z006 Encounter for examination for normal comparison and control in clinical research program: Secondary | ICD-10-CM

## 2024-03-10 DIAGNOSIS — I130 Hypertensive heart and chronic kidney disease with heart failure and stage 1 through stage 4 chronic kidney disease, or unspecified chronic kidney disease: Secondary | ICD-10-CM

## 2024-03-10 DIAGNOSIS — N1832 Chronic kidney disease, stage 3b: Secondary | ICD-10-CM

## 2024-03-10 DIAGNOSIS — G459 Transient cerebral ischemic attack, unspecified: Secondary | ICD-10-CM | POA: Diagnosis not present

## 2024-03-10 DIAGNOSIS — Z7901 Long term (current) use of anticoagulants: Secondary | ICD-10-CM | POA: Insufficient documentation

## 2024-03-10 DIAGNOSIS — I4819 Other persistent atrial fibrillation: Secondary | ICD-10-CM | POA: Insufficient documentation

## 2024-03-10 DIAGNOSIS — N1831 Chronic kidney disease, stage 3a: Secondary | ICD-10-CM | POA: Diagnosis not present

## 2024-03-10 DIAGNOSIS — I2510 Atherosclerotic heart disease of native coronary artery without angina pectoris: Secondary | ICD-10-CM | POA: Diagnosis not present

## 2024-03-10 DIAGNOSIS — I4891 Unspecified atrial fibrillation: Secondary | ICD-10-CM | POA: Diagnosis not present

## 2024-03-10 DIAGNOSIS — I5020 Unspecified systolic (congestive) heart failure: Secondary | ICD-10-CM

## 2024-03-10 DIAGNOSIS — Z79899 Other long term (current) drug therapy: Secondary | ICD-10-CM | POA: Insufficient documentation

## 2024-03-10 DIAGNOSIS — I080 Rheumatic disorders of both mitral and aortic valves: Secondary | ICD-10-CM | POA: Diagnosis not present

## 2024-03-10 DIAGNOSIS — I5022 Chronic systolic (congestive) heart failure: Secondary | ICD-10-CM | POA: Diagnosis not present

## 2024-03-10 HISTORY — DX: Unspecified systolic (congestive) heart failure: I50.20

## 2024-03-10 HISTORY — PX: CARDIOVERSION: EP1203

## 2024-03-10 SURGERY — CARDIOVERSION (CATH LAB)
Anesthesia: General

## 2024-03-10 MED ORDER — AMIODARONE HCL 200 MG PO TABS
200.0000 mg | ORAL_TABLET | Freq: Every day | ORAL | Status: AC
Start: 2024-03-10 — End: ?

## 2024-03-10 MED ORDER — LIDOCAINE 2% (20 MG/ML) 5 ML SYRINGE
INTRAMUSCULAR | Status: DC | PRN
  Administered 2024-03-10: 100 mg via INTRAVENOUS

## 2024-03-10 MED ORDER — SODIUM CHLORIDE 0.9 % IV SOLN: INTRAVENOUS | Status: DC

## 2024-03-10 MED ORDER — PROPOFOL 10 MG/ML IV BOLUS
INTRAVENOUS | Status: DC | PRN
  Administered 2024-03-10: 40 mg via INTRAVENOUS

## 2024-03-10 MED ORDER — PHENYLEPHRINE 80 MCG/ML (10ML) SYRINGE FOR IV PUSH (FOR BLOOD PRESSURE SUPPORT)
PREFILLED_SYRINGE | INTRAVENOUS | Status: DC | PRN
Start: 2024-03-10 — End: 2024-03-10
  Administered 2024-03-10: 100 ug via INTRAVENOUS

## 2024-03-10 SURGICAL SUPPLY — 1 items: PAD DEFIB RADIO PHYSIO CONN (PAD) ×1 IMPLANT

## 2024-03-10 NOTE — Anesthesia Procedure Notes (Signed)
 Procedure Name: MAC Date/Time: 03/10/2024 9:18 AM  Performed by: Damire Remedios J, CRNAPre-anesthesia Checklist: Patient identified, Emergency Drugs available, Suction available, Patient being monitored and Timeout performed Patient Re-evaluated:Patient Re-evaluated prior to induction Oxygen Delivery Method: Nasal cannula Preoxygenation: Pre-oxygenation with 100% oxygen Induction Type: IV induction Placement Confirmation: positive ETCO2

## 2024-03-10 NOTE — Anesthesia Preprocedure Evaluation (Signed)
 Anesthesia Evaluation  Patient identified by MRN, date of birth, ID band Patient awake    Reviewed: Allergy & Precautions, NPO status , Patient's Chart, lab work & pertinent test results, reviewed documented beta blocker date and time   Airway Mallampati: II  TM Distance: >3 FB     Dental no notable dental hx.    Pulmonary neg shortness of breath, neg COPD   breath sounds clear to auscultation       Cardiovascular hypertension, + angina  + CAD and +CHF  + dysrhythmias Atrial Fibrillation + Valvular Problems/Murmurs (moderate AS, mod MR) AS and MR  Rhythm:Irregular Rate:Normal + Systolic murmurs IMPRESSIONS     1. Left ventricular ejection fraction, by estimation, is 25 to 30%. The  left ventricle has severely decreased function. The left ventricle  demonstrates global hypokinesis. The left ventricular internal cavity size  was mildly dilated. Left ventricular  diastolic parameters are indeterminate.   2. Right ventricular systolic function is moderately reduced. The right  ventricular size is mildly enlarged.   3. Left atrial size was mildly dilated.   4. Right atrial size was mildly dilated.   5. The mitral valve is grossly normal. Moderate mitral valve  regurgitation. No evidence of mitral stenosis.   6. The aortic valve is tricuspid. There is moderate calcification of the  aortic valve. There is moderate thickening of the aortic valve. Aortic  valve regurgitation is not visualized. Mild to moderate aortic valve  stenosis. Aortic valve area, by VTI  measures 1.70 cm. Aortic valve mean gradient measures 9.6 mmHg.   7. Evidence of atrial level shunting detected by color flow Doppler.  There is a small patent foramen ovale with predominantly left to right  shunting across the atrial septum.     Neuro/Psych  Headaches PD TIA   GI/Hepatic ,GERD  ,,(+) neg Cirrhosis        Endo/Other    Renal/GU CRFRenal disease      Musculoskeletal   Abdominal   Peds  Hematology   Anesthesia Other Findings   Reproductive/Obstetrics                              Anesthesia Physical Anesthesia Plan  ASA: 4  Anesthesia Plan: General   Post-op Pain Management:    Induction:   PONV Risk Score and Plan: 1  Airway Management Planned: Natural Airway and Simple Face Mask  Additional Equipment:   Intra-op Plan:   Post-operative Plan:   Informed Consent: I have reviewed the patients History and Physical, chart, labs and discussed the procedure including the risks, benefits and alternatives for the proposed anesthesia with the patient or authorized representative who has indicated his/her understanding and acceptance.     Dental advisory given  Plan Discussed with: CRNA  Anesthesia Plan Comments:          Anesthesia Quick Evaluation

## 2024-03-10 NOTE — Anesthesia Postprocedure Evaluation (Signed)
 Anesthesia Post Note  Patient: Keith [name]  Procedure(s) Performed: CARDIOVERSION     Patient location during evaluation: PACU Anesthesia Type: General Level of consciousness: awake and alert Pain management: pain level controlled Vital Signs Assessment: post-procedure vital signs reviewed and stable Respiratory status: spontaneous breathing, nonlabored ventilation, respiratory function stable and patient connected to nasal cannula oxygen Cardiovascular status: blood pressure returned to baseline and stable Postop Assessment: no apparent nausea or vomiting Anesthetic complications: no   No notable events documented.  Last Vitals:  Vitals:   03/10/24 0930 03/10/24 0952  BP: 121/68 128/74  Pulse: (!) 54 (!) 54  Resp: (!) 21 19  Temp:    SpO2: 93% 95%    Last Pain:  Vitals:   03/10/24 0952  TempSrc:   PainSc: 0-No pain                 [name]

## 2024-03-10 NOTE — Discharge Instructions (Signed)

## 2024-03-10 NOTE — Transfer of Care (Signed)
 Immediate Anesthesia Transfer of Care Note  Patient: Keith [name]  Procedure(s) Performed: CARDIOVERSION  Patient Location: PACU and Cath Lab  Anesthesia Type:General  Level of Consciousness: drowsy  Airway & Oxygen Therapy: Patient Spontanous Breathing and Patient connected to nasal cannula oxygen  Post-op Assessment: Report given to RN and Post -op Vital signs reviewed and stable  Post vital signs: Reviewed and stable  Last Vitals:  Vitals Value Taken Time  BP    Temp    Pulse    Resp    SpO2      Last Pain:  Vitals:   03/10/24 0858  TempSrc:   PainSc: 0-No pain         Complications: No notable events documented.

## 2024-03-10 NOTE — Research (Signed)
 [facility] Cardioversion Informed Consent   Subject Name: Keith [name]  Subject met inclusion and exclusion criteria.  The informed consent form, study requirements and expectations were reviewed with the subject and questions and concerns were addressed prior to the signing of the consent form.  The subject verbalized understanding of the trial requirements.  The subject agreed to participate in the [facility] Cardioversion trial and signed the informed consent at 0850 on 21/Oct/2025.  The informed consent was obtained prior to performance of any protocol-specific procedures for the subject.  A copy of the signed informed consent was given to the subject and a copy was placed in the subject's medical record.   Keith [name]

## 2024-03-10 NOTE — CV Procedure (Signed)
    Electrical Cardioversion Procedure Note Keith [name] [id number] 05-04-1941  Procedure: Electrical Cardioversion Indications:  Atrial Fibrillation  Time Out: Verified patient identification, verified procedure,medications/allergies/relevent history reviewed, required imaging and test results available.  Performed  Procedure Details  During this procedure the patient is administered a total of Propofol  40 mg and Lidocaine  100 mg to achieve and maintain moderate conscious sedation and 80mcg of neosynephrine.  The patient's heart rate, blood pressure, and oxygen saturation are monitored continuously during the procedure. The period of conscious sedation is 2 minutes, of which I was present face-to-face 100% of this time. Keith Kufeji, CRNA is an independent, trained observer who assisted in the monitoring of the patient's level of consciousness.     Cardioversion was done with synchronized biphasic defibrillation with AP pads with 200watts.  The patient converted to normal sinus rhythm. The patient tolerated the procedure well   IMPRESSION:  Successful cardioversion of atrial fibrillation    Keith [name] 03/10/2024, 8:56 AM

## 2024-03-10 NOTE — Interval H&P Note (Signed)
 History and Physical Interval Note:  03/10/2024 8:56 AM  Keith [name]  has presented today for surgery, with the diagnosis of AFIB.  The various methods of treatment have been discussed with the patient and family. After consideration of risks, benefits and other options for treatment, the patient has consented to  Procedure(s): CARDIOVERSION (N/A) as a surgical intervention.  The patient's history has been reviewed, patient examined, no change in status, stable for surgery.  I have reviewed the patient's chart and labs.  Questions were answered to the patient's satisfaction.     Wilbert Bihari

## 2024-03-11 ENCOUNTER — Encounter (HOSPITAL_COMMUNITY): Payer: Self-pay | Admitting: Cardiology

## 2024-03-12 ENCOUNTER — Ambulatory Visit: Admitting: Nurse Practitioner

## 2024-03-12 DIAGNOSIS — I130 Hypertensive heart and chronic kidney disease with heart failure and stage 1 through stage 4 chronic kidney disease, or unspecified chronic kidney disease: Secondary | ICD-10-CM | POA: Diagnosis not present

## 2024-03-12 DIAGNOSIS — I5033 Acute on chronic diastolic (congestive) heart failure: Secondary | ICD-10-CM | POA: Diagnosis not present

## 2024-03-12 DIAGNOSIS — N1832 Chronic kidney disease, stage 3b: Secondary | ICD-10-CM | POA: Diagnosis not present

## 2024-03-12 DIAGNOSIS — M1030 Gout due to renal impairment, unspecified site: Secondary | ICD-10-CM | POA: Diagnosis not present

## 2024-03-12 DIAGNOSIS — I501 Left ventricular failure: Secondary | ICD-10-CM | POA: Diagnosis not present

## 2024-03-12 DIAGNOSIS — I480 Paroxysmal atrial fibrillation: Secondary | ICD-10-CM | POA: Diagnosis not present

## 2024-03-13 ENCOUNTER — Encounter (HOSPITAL_COMMUNITY): Payer: Self-pay | Admitting: Cardiology

## 2024-03-13 DIAGNOSIS — M1030 Gout due to renal impairment, unspecified site: Secondary | ICD-10-CM | POA: Diagnosis not present

## 2024-03-13 DIAGNOSIS — I501 Left ventricular failure: Secondary | ICD-10-CM | POA: Diagnosis not present

## 2024-03-13 DIAGNOSIS — I130 Hypertensive heart and chronic kidney disease with heart failure and stage 1 through stage 4 chronic kidney disease, or unspecified chronic kidney disease: Secondary | ICD-10-CM | POA: Diagnosis not present

## 2024-03-13 DIAGNOSIS — I5033 Acute on chronic diastolic (congestive) heart failure: Secondary | ICD-10-CM | POA: Diagnosis not present

## 2024-03-13 DIAGNOSIS — I480 Paroxysmal atrial fibrillation: Secondary | ICD-10-CM | POA: Diagnosis not present

## 2024-03-13 DIAGNOSIS — N1832 Chronic kidney disease, stage 3b: Secondary | ICD-10-CM | POA: Diagnosis not present

## 2024-03-18 ENCOUNTER — Encounter: Payer: Self-pay | Admitting: *Deleted

## 2024-03-19 DIAGNOSIS — I480 Paroxysmal atrial fibrillation: Secondary | ICD-10-CM | POA: Diagnosis not present

## 2024-03-19 DIAGNOSIS — I501 Left ventricular failure: Secondary | ICD-10-CM | POA: Diagnosis not present

## 2024-03-19 DIAGNOSIS — N1832 Chronic kidney disease, stage 3b: Secondary | ICD-10-CM | POA: Diagnosis not present

## 2024-03-19 DIAGNOSIS — I130 Hypertensive heart and chronic kidney disease with heart failure and stage 1 through stage 4 chronic kidney disease, or unspecified chronic kidney disease: Secondary | ICD-10-CM | POA: Diagnosis not present

## 2024-03-19 DIAGNOSIS — I5033 Acute on chronic diastolic (congestive) heart failure: Secondary | ICD-10-CM | POA: Diagnosis not present

## 2024-03-19 DIAGNOSIS — M1030 Gout due to renal impairment, unspecified site: Secondary | ICD-10-CM | POA: Diagnosis not present

## 2024-03-19 NOTE — Assessment & Plan Note (Signed)
 He underwent TEE DCCV in 01/2024 but had return of AFib. He was loaded with Amiodarone  and underwent repeat DCCV. ***

## 2024-03-19 NOTE — Assessment & Plan Note (Signed)
 Mild to moderate by echocardiogram in September 2025.  Repeat TTE is due in Dec 2025 to recheck EF.

## 2024-03-19 NOTE — Assessment & Plan Note (Signed)
 Moderate mitral digitation by echocardiogram September 2025.  Repeat limited echo will be obtained in 3 months to reassess LV function***

## 2024-03-19 NOTE — Assessment & Plan Note (Signed)
 He has a hx of reduced EF that improved to normal. He was admitted in 01/2024 with acute CHF and AFib. His EF was again reduced. Last TTE in 01/2024 with EF 25-30. Volume status is currently stable. He is NYHA IIb. -Continue Bumetanide  1 mg once daily, Jardiance  10 mg once daily, Irbesartan  150 mg once daily  -Reduce Nebivolol  to 10 mg once daily  -Follow up TTE scheduled 05/07/24 to reassess LVEF

## 2024-03-19 NOTE — Progress Notes (Signed)
 "     OFFICE NOTE:    Date:  03/20/2024  ID:  Keith [name], DOB [birth year], MRN [id number] PCP: Gerome Brunet, DO  [facility] Providers Cardiologist:  Gordy Bergamo, MD        Persistent atrial fibrillation  Admitted with acute CHF, AF w RVR >> s/p TEE DCCV 01/2024  (HFrEF) heart failure with reduced ejection fraction EF improved to normal in the past  TTE in 01/2020: EF 35-40 >> TTE in 06/2021: EF 50-55 EF ? with AFib w RVR TTE 01/21/24: EF 30-35, GR 1 DD, normal RVSF, moderate LAE, severe RAE, small pericardial effusion, moderate MR, AV sclerosis TEE 01/22/2024: EF 20-25, mild to moderate MR, mild TR, no LAA clot, PFO TTE 01/30/2024: EF 25-30, global HK, moderately reduced RVSF, mild LAE, mild RAE, moderate MR, mild-moderate AS (mean 9.6, V-max 194.8 cm/s, DI 0.35) Aortic stenosis Mild to mod by TTE 01/2024 Mitral regurgitation Mod MR by TTE 01/2024  Coronary artery Ca2+ Patent Foramen Ovale  Hypertension  Chronic kidney disease stage 3 Hyperlipidemia  Hx of DVT in 11/2022 BPH Parkinson's disease        Discussed the use of AI scribe software for clinical note transcription with the patient, who gave verbal consent to proceed. History of Present Illness Keith [name] is a 83 y.o. male for follow up after DCCV done on 03/10/24.   He has been experiencing shoulder pain for the last couple of days, which he attributes to a fall two to three months ago. The pain is exacerbated by certain movements and relieved by massage. He is unable to raise his arm above his head without pain. His weight fluctuates and he has been eating more recently. He has not had substernal chest heaviness. He has not been short of breath and denies orthopnea. His leg edema is unchanged.     ROS-See HPI    Studies Reviewed:  EKG Interpretation Date/Time:  Friday March 20 2024 11:42:43 EDT Ventricular Rate:  53 PR Interval:  200 QRS Duration:  100 QT Interval:  444 QTC Calculation: 416 R  Axis:   63  Text Interpretation: Sinus bradycardia Possible Anterior infarct Confirmed by Lelon Hamilton ([id number]) on 03/20/2024 11:45:33 AM     Risk Assessment/Calculations: CHA2DS2-VASc Score = 5   This indicates a 7.2% annual risk of stroke. The patient's score is based upon: CHF History: 1 HTN History: 1 Diabetes History: 0 Stroke History: 0 Vascular Disease History: 1 Age Score: 2 Gender Score: 0            Physical Exam:  VS:  BP 139/74   Pulse (!) 55   Ht 5' 7 (1.702 m)   Wt 193 lb 3.2 oz (87.6 kg)   SpO2 96%   BMI 30.26 kg/m        Wt Readings from Last 3 Encounters:  03/20/24 193 lb 3.2 oz (87.6 kg)  03/10/24 184 lb 9.6 oz (83.7 kg)  03/03/24 188 lb (85.3 kg)    Constitutional:      Appearance: Healthy appearance. Not in distress.  Neck:     Vascular: JVD normal.  Pulmonary:     Breath sounds: No wheezing. Bibasilar Rales present.  Cardiovascular:     Bradycardia present. Regular rhythm.     Murmurs: There is no murmur.  Edema:    Peripheral edema present.    Pretibial: bilateral 2+ edema of the pretibial area. Abdominal:     Palpations: Abdomen is soft.  Skin:  General: Skin is warm and dry.       Assessment and Plan:    Assessment & Plan Persistent atrial fibrillation (HCC) On amiodarone  therapy He underwent TEE DCCV in 01/2024 but had return of AFib. He was loaded with Amiodarone  and underwent repeat DCCV.  He is maintaining sinus rhythm today.  His initial EKG had a lot of artifact and it appeared as though his QT was significantly prolonged.  However, repeat EKG confirms his QT interval is normal.  He has not really noticed a significant change in his symptoms. -Continue amiodarone  200 mg daily, Eliquis  2.5 mg twice daily -Arrange baseline PFTs with DLCO -BMET, magnesium today -He will need follow-up LFTs and TSH in the next several months. HFrEF (heart failure with reduced ejection fraction) (HCC) He has a hx of reduced EF that improved  to normal. He was admitted in 01/2024 with acute CHF and AFib. His EF was again reduced. Last TTE in 01/2024 with EF 25-30.  He seems to be volume overloaded.  His weight is up and he has rales on exam.  His breathing has not really changed that significantly.  He has a chronic cough without change. -Increase Bumex  to 1 mg daily x 3 days, then 1 mg Monday Wednesday Friday and 0.5 mg all other days -BMET, magnesium, BNP today -He has a follow-up echocardiogram to reassess LV function in December -Follow-up 4 weeks Stage 3a chronic kidney disease (HCC) Repeat BMET today Nonrheumatic aortic (valve) stenosis Mild to moderate by echocardiogram in September 2025.  Repeat TTE is due in Dec 2025 to recheck EF. Nonrheumatic mitral valve regurgitation Moderate mitral digitation by echocardiogram September 2025.  Repeat limited TTE due in 04/2024 as noted.          Dispo:  Return in about 4 weeks (around 04/17/2024) for Routine Follow Up, w/ Glendia Ferrier, PA-C.  Signed, Glendia Ferrier, PA-C   "

## 2024-03-20 ENCOUNTER — Encounter: Payer: Self-pay | Admitting: Physician Assistant

## 2024-03-20 ENCOUNTER — Ambulatory Visit: Attending: Physician Assistant | Admitting: Physician Assistant

## 2024-03-20 VITALS — BP 139/74 | HR 55 | Ht 67.0 in | Wt 193.2 lb

## 2024-03-20 DIAGNOSIS — I350 Nonrheumatic aortic (valve) stenosis: Secondary | ICD-10-CM | POA: Insufficient documentation

## 2024-03-20 DIAGNOSIS — I5020 Unspecified systolic (congestive) heart failure: Secondary | ICD-10-CM | POA: Diagnosis not present

## 2024-03-20 DIAGNOSIS — R0602 Shortness of breath: Secondary | ICD-10-CM | POA: Insufficient documentation

## 2024-03-20 DIAGNOSIS — N1831 Chronic kidney disease, stage 3a: Secondary | ICD-10-CM | POA: Diagnosis not present

## 2024-03-20 DIAGNOSIS — I4819 Other persistent atrial fibrillation: Secondary | ICD-10-CM | POA: Diagnosis not present

## 2024-03-20 DIAGNOSIS — Z79899 Other long term (current) drug therapy: Secondary | ICD-10-CM | POA: Diagnosis not present

## 2024-03-20 DIAGNOSIS — I340 Nonrheumatic mitral (valve) insufficiency: Secondary | ICD-10-CM | POA: Insufficient documentation

## 2024-03-20 MED ORDER — BUMETANIDE 0.5 MG PO TABS: ORAL_TABLET | ORAL | 1 refills | Status: DC

## 2024-03-20 NOTE — Patient Instructions (Addendum)
 Medication Instructions:   Increase Bumex  to 1mg  daily x 3 days, then change regular dose to 1mg  (2 tabs) on Monday, Wednesday, Friday & 1 tab on all other days  Continue all other medications.     Labwork:  BMET, BNP, Mg - orders given  Please do today.  Testing/Procedures:  Your physician has recommended that you have a pulmonary function test. Pulmonary Function Tests are a group of tests that measure how well air moves in and out of your lungs.   Follow-Up:  Office will contact with results via phone, letter or mychart.    4 weeks    Any Other Special Instructions Will Be Listed Below (If Applicable).   If you need a refill on your cardiac medications before your next appointment, please call your pharmacy.

## 2024-03-20 NOTE — Assessment & Plan Note (Signed)
Repeat BMET today.  

## 2024-03-21 LAB — BASIC METABOLIC PANEL WITH GFR
BUN/Creatinine Ratio: 15 (ref 10–24)
BUN: 28 mg/dL — ABNORMAL HIGH (ref 8–27)
CO2: 23 mmol/L (ref 20–29)
Calcium: 8.6 mg/dL (ref 8.6–10.2)
Chloride: 104 mmol/L (ref 96–106)
Creatinine, Ser: 1.89 mg/dL — ABNORMAL HIGH (ref 0.76–1.27)
Glucose: 77 mg/dL (ref 70–99)
Potassium: 3.9 mmol/L (ref 3.5–5.2)
Sodium: 143 mmol/L (ref 134–144)
eGFR: 35 mL/min/1.73 — ABNORMAL LOW (ref >59)

## 2024-03-21 LAB — MAGNESIUM: Magnesium: 2 mg/dL (ref 1.6–2.3)

## 2024-03-21 LAB — PRO B NATRIURETIC PEPTIDE: NT-Pro BNP: 17245 pg/mL — ABNORMAL HIGH (ref 0–486)

## 2024-03-23 ENCOUNTER — Ambulatory Visit: Payer: Self-pay | Admitting: Physician Assistant

## 2024-03-23 DIAGNOSIS — N1832 Chronic kidney disease, stage 3b: Secondary | ICD-10-CM | POA: Diagnosis not present

## 2024-03-23 DIAGNOSIS — I5023 Acute on chronic systolic (congestive) heart failure: Secondary | ICD-10-CM

## 2024-03-23 DIAGNOSIS — M1030 Gout due to renal impairment, unspecified site: Secondary | ICD-10-CM | POA: Diagnosis not present

## 2024-03-23 DIAGNOSIS — N1831 Chronic kidney disease, stage 3a: Secondary | ICD-10-CM

## 2024-03-23 DIAGNOSIS — I501 Left ventricular failure: Secondary | ICD-10-CM | POA: Diagnosis not present

## 2024-03-23 DIAGNOSIS — I5033 Acute on chronic diastolic (congestive) heart failure: Secondary | ICD-10-CM | POA: Diagnosis not present

## 2024-03-23 DIAGNOSIS — I480 Paroxysmal atrial fibrillation: Secondary | ICD-10-CM | POA: Diagnosis not present

## 2024-03-23 DIAGNOSIS — I130 Hypertensive heart and chronic kidney disease with heart failure and stage 1 through stage 4 chronic kidney disease, or unspecified chronic kidney disease: Secondary | ICD-10-CM | POA: Diagnosis not present

## 2024-03-23 MED ORDER — BUMETANIDE 0.5 MG PO TABS: 1.0000 mg | ORAL_TABLET | Freq: Every day | ORAL | Status: AC

## 2024-03-23 MED ORDER — NEBIVOLOL HCL 5 MG PO TABS: 5.0000 mg | ORAL_TABLET | Freq: Every day | ORAL | 3 refills | Status: AC

## 2024-03-23 NOTE — Telephone Encounter (Signed)
 Spoke with pts daughter Cherylle) regarding plan. Sueanne verbalized understanding and agreed to plan. She also stated pt has plenty of Bumex  and she will pick up new Rx for Bystolic . Pts daughter stated pt will be moving into a assisted living in about two weeks. Pt was advised to call our office if her or the pt had any further questions.

## 2024-03-24 DIAGNOSIS — N1832 Chronic kidney disease, stage 3b: Secondary | ICD-10-CM | POA: Diagnosis not present

## 2024-03-24 DIAGNOSIS — I501 Left ventricular failure: Secondary | ICD-10-CM | POA: Diagnosis not present

## 2024-03-24 DIAGNOSIS — I480 Paroxysmal atrial fibrillation: Secondary | ICD-10-CM | POA: Diagnosis not present

## 2024-03-24 DIAGNOSIS — I5033 Acute on chronic diastolic (congestive) heart failure: Secondary | ICD-10-CM | POA: Diagnosis not present

## 2024-03-24 DIAGNOSIS — I130 Hypertensive heart and chronic kidney disease with heart failure and stage 1 through stage 4 chronic kidney disease, or unspecified chronic kidney disease: Secondary | ICD-10-CM | POA: Diagnosis not present

## 2024-03-24 DIAGNOSIS — M1030 Gout due to renal impairment, unspecified site: Secondary | ICD-10-CM | POA: Diagnosis not present

## 2024-03-25 DIAGNOSIS — Z022 Encounter for examination for admission to residential institution: Secondary | ICD-10-CM | POA: Diagnosis not present

## 2024-03-25 DIAGNOSIS — M159 Polyosteoarthritis, unspecified: Secondary | ICD-10-CM | POA: Diagnosis not present

## 2024-03-25 DIAGNOSIS — I5032 Chronic diastolic (congestive) heart failure: Secondary | ICD-10-CM | POA: Diagnosis not present

## 2024-03-25 DIAGNOSIS — I129 Hypertensive chronic kidney disease with stage 1 through stage 4 chronic kidney disease, or unspecified chronic kidney disease: Secondary | ICD-10-CM | POA: Diagnosis not present

## 2024-03-25 DIAGNOSIS — I4891 Unspecified atrial fibrillation: Secondary | ICD-10-CM | POA: Diagnosis not present

## 2024-03-25 DIAGNOSIS — K219 Gastro-esophageal reflux disease without esophagitis: Secondary | ICD-10-CM | POA: Diagnosis not present

## 2024-03-25 DIAGNOSIS — N1832 Chronic kidney disease, stage 3b: Secondary | ICD-10-CM | POA: Diagnosis not present

## 2024-03-25 DIAGNOSIS — M5030 Other cervical disc degeneration, unspecified cervical region: Secondary | ICD-10-CM | POA: Diagnosis not present

## 2024-03-25 DIAGNOSIS — G20A1 Parkinson's disease without dyskinesia, without mention of fluctuations: Secondary | ICD-10-CM | POA: Diagnosis not present

## 2024-03-25 DIAGNOSIS — M51362 Other intervertebral disc degeneration, lumbar region with discogenic back pain and lower extremity pain: Secondary | ICD-10-CM | POA: Diagnosis not present

## 2024-03-25 DIAGNOSIS — I501 Left ventricular failure: Secondary | ICD-10-CM | POA: Diagnosis not present

## 2024-03-26 DIAGNOSIS — Z7901 Long term (current) use of anticoagulants: Secondary | ICD-10-CM | POA: Diagnosis not present

## 2024-03-26 DIAGNOSIS — G20A1 Parkinson's disease without dyskinesia, without mention of fluctuations: Secondary | ICD-10-CM | POA: Diagnosis not present

## 2024-03-26 DIAGNOSIS — Q6101 Congenital single renal cyst: Secondary | ICD-10-CM | POA: Diagnosis not present

## 2024-03-26 DIAGNOSIS — M160 Bilateral primary osteoarthritis of hip: Secondary | ICD-10-CM | POA: Diagnosis not present

## 2024-03-26 DIAGNOSIS — I088 Other rheumatic multiple valve diseases: Secondary | ICD-10-CM | POA: Diagnosis not present

## 2024-03-26 DIAGNOSIS — R911 Solitary pulmonary nodule: Secondary | ICD-10-CM | POA: Diagnosis not present

## 2024-03-26 DIAGNOSIS — I700 Atherosclerosis of aorta: Secondary | ICD-10-CM | POA: Diagnosis not present

## 2024-03-26 DIAGNOSIS — Z7984 Long term (current) use of oral hypoglycemic drugs: Secondary | ICD-10-CM | POA: Diagnosis not present

## 2024-03-26 DIAGNOSIS — N1832 Chronic kidney disease, stage 3b: Secondary | ICD-10-CM | POA: Diagnosis not present

## 2024-03-26 DIAGNOSIS — G43909 Migraine, unspecified, not intractable, without status migrainosus: Secondary | ICD-10-CM | POA: Diagnosis not present

## 2024-03-26 DIAGNOSIS — N200 Calculus of kidney: Secondary | ICD-10-CM | POA: Diagnosis not present

## 2024-03-26 DIAGNOSIS — I5032 Chronic diastolic (congestive) heart failure: Secondary | ICD-10-CM | POA: Diagnosis not present

## 2024-03-26 DIAGNOSIS — J9811 Atelectasis: Secondary | ICD-10-CM | POA: Diagnosis not present

## 2024-03-26 DIAGNOSIS — I2510 Atherosclerotic heart disease of native coronary artery without angina pectoris: Secondary | ICD-10-CM | POA: Diagnosis not present

## 2024-03-26 DIAGNOSIS — I480 Paroxysmal atrial fibrillation: Secondary | ICD-10-CM | POA: Diagnosis not present

## 2024-03-26 DIAGNOSIS — J309 Allergic rhinitis, unspecified: Secondary | ICD-10-CM | POA: Diagnosis not present

## 2024-03-26 DIAGNOSIS — M1030 Gout due to renal impairment, unspecified site: Secondary | ICD-10-CM | POA: Diagnosis not present

## 2024-03-26 DIAGNOSIS — I5023 Acute on chronic systolic (congestive) heart failure: Secondary | ICD-10-CM | POA: Diagnosis not present

## 2024-03-26 DIAGNOSIS — I501 Left ventricular failure: Secondary | ICD-10-CM | POA: Diagnosis not present

## 2024-03-26 DIAGNOSIS — N400 Enlarged prostate without lower urinary tract symptoms: Secondary | ICD-10-CM | POA: Diagnosis not present

## 2024-03-26 DIAGNOSIS — I130 Hypertensive heart and chronic kidney disease with heart failure and stage 1 through stage 4 chronic kidney disease, or unspecified chronic kidney disease: Secondary | ICD-10-CM | POA: Diagnosis not present

## 2024-03-26 DIAGNOSIS — Z6829 Body mass index (BMI) 29.0-29.9, adult: Secondary | ICD-10-CM | POA: Diagnosis not present

## 2024-03-26 DIAGNOSIS — K219 Gastro-esophageal reflux disease without esophagitis: Secondary | ICD-10-CM | POA: Diagnosis not present

## 2024-03-26 DIAGNOSIS — M19012 Primary osteoarthritis, left shoulder: Secondary | ICD-10-CM | POA: Diagnosis not present

## 2024-03-26 DIAGNOSIS — E7800 Pure hypercholesterolemia, unspecified: Secondary | ICD-10-CM | POA: Diagnosis not present

## 2024-03-27 LAB — BASIC METABOLIC PANEL WITH GFR
BUN/Creatinine Ratio: 9 — ABNORMAL LOW (ref 10–24)
BUN: 21 mg/dL (ref 8–27)
CO2: 26 mmol/L (ref 20–29)
Calcium: 9.2 mg/dL (ref 8.6–10.2)
Chloride: 102 mmol/L (ref 96–106)
Creatinine, Ser: 2.31 mg/dL — ABNORMAL HIGH (ref 0.76–1.27)
Glucose: 94 mg/dL (ref 70–99)
Potassium: 3.7 mmol/L (ref 3.5–5.2)
Sodium: 144 mmol/L (ref 134–144)
eGFR: 27 mL/min/1.73 — ABNORMAL LOW (ref >59)

## 2024-03-27 LAB — PRO B NATRIURETIC PEPTIDE: NT-Pro BNP: 14425 pg/mL — ABNORMAL HIGH (ref 0–486)

## 2024-03-27 NOTE — Addendum Note (Signed)
 Addended byBETHA FERRIER, Timiko Offutt T on: 03/27/2024 08:00 AM   Modules accepted: Orders

## 2024-03-30 DIAGNOSIS — I480 Paroxysmal atrial fibrillation: Secondary | ICD-10-CM | POA: Diagnosis not present

## 2024-03-30 DIAGNOSIS — M1030 Gout due to renal impairment, unspecified site: Secondary | ICD-10-CM | POA: Diagnosis not present

## 2024-03-30 DIAGNOSIS — N1832 Chronic kidney disease, stage 3b: Secondary | ICD-10-CM | POA: Diagnosis not present

## 2024-03-30 DIAGNOSIS — I5032 Chronic diastolic (congestive) heart failure: Secondary | ICD-10-CM | POA: Diagnosis not present

## 2024-03-30 DIAGNOSIS — I130 Hypertensive heart and chronic kidney disease with heart failure and stage 1 through stage 4 chronic kidney disease, or unspecified chronic kidney disease: Secondary | ICD-10-CM | POA: Diagnosis not present

## 2024-03-30 DIAGNOSIS — I501 Left ventricular failure: Secondary | ICD-10-CM | POA: Diagnosis not present

## 2024-04-02 ENCOUNTER — Telehealth: Payer: Self-pay | Admitting: Physician Assistant

## 2024-04-02 DIAGNOSIS — I501 Left ventricular failure: Secondary | ICD-10-CM | POA: Diagnosis not present

## 2024-04-02 DIAGNOSIS — I4891 Unspecified atrial fibrillation: Secondary | ICD-10-CM

## 2024-04-02 DIAGNOSIS — I480 Paroxysmal atrial fibrillation: Secondary | ICD-10-CM | POA: Diagnosis not present

## 2024-04-02 DIAGNOSIS — M1030 Gout due to renal impairment, unspecified site: Secondary | ICD-10-CM | POA: Diagnosis not present

## 2024-04-02 DIAGNOSIS — N1832 Chronic kidney disease, stage 3b: Secondary | ICD-10-CM | POA: Diagnosis not present

## 2024-04-02 DIAGNOSIS — I130 Hypertensive heart and chronic kidney disease with heart failure and stage 1 through stage 4 chronic kidney disease, or unspecified chronic kidney disease: Secondary | ICD-10-CM | POA: Diagnosis not present

## 2024-04-02 DIAGNOSIS — I5032 Chronic diastolic (congestive) heart failure: Secondary | ICD-10-CM | POA: Diagnosis not present

## 2024-04-02 NOTE — Telephone Encounter (Signed)
*  STAT* If patient is at the pharmacy, call can be transferred to refill team.   1. Which medications need to be refilled? (please list name of each medication and dose if known) apixaban  (ELIQUIS ) 2.5 MG TABS tablet    4. Which pharmacy/location (including street and city if local pharmacy) is medication to be sent to?  [id number] - [place], [place] - [address] AT [facility] [address] Phone: [phone]  Fax: [phone]       5. Do they need a 30 day or 90 day supply? 90    Sch 12/3

## 2024-04-03 DIAGNOSIS — N1831 Chronic kidney disease, stage 3a: Secondary | ICD-10-CM | POA: Diagnosis not present

## 2024-04-03 DIAGNOSIS — I5020 Unspecified systolic (congestive) heart failure: Secondary | ICD-10-CM | POA: Diagnosis not present

## 2024-04-03 LAB — BASIC METABOLIC PANEL WITH GFR
BUN/Creatinine Ratio: 12 (ref 10–24)
BUN: 27 mg/dL (ref 8–27)
CO2: 24 mmol/L (ref 20–29)
Calcium: 9.2 mg/dL (ref 8.6–10.2)
Chloride: 103 mmol/L (ref 96–106)
Creatinine, Ser: 2.26 mg/dL — ABNORMAL HIGH (ref 0.76–1.27)
Glucose: 115 mg/dL — ABNORMAL HIGH (ref 70–99)
Potassium: 3.9 mmol/L (ref 3.5–5.2)
Sodium: 144 mmol/L (ref 134–144)
eGFR: 28 mL/min/1.73 — ABNORMAL LOW (ref >59)

## 2024-04-03 MED ORDER — APIXABAN 2.5 MG PO TABS
2.5000 mg | ORAL_TABLET | Freq: Two times a day (BID) | ORAL | 1 refills | Status: AC
Start: 2024-04-03 — End: ?

## 2024-04-03 NOTE — Telephone Encounter (Signed)
 Prescription refill request for Eliquis  received. Indication: AFIB Last office visit: 03/20/24 Scr: 2.31 03/26/24 Age:  83 Weight: 87.6 KG

## 2024-04-05 ENCOUNTER — Ambulatory Visit: Payer: Self-pay | Admitting: Physician Assistant

## 2024-04-10 DIAGNOSIS — I480 Paroxysmal atrial fibrillation: Secondary | ICD-10-CM | POA: Diagnosis not present

## 2024-04-10 DIAGNOSIS — I5032 Chronic diastolic (congestive) heart failure: Secondary | ICD-10-CM | POA: Diagnosis not present

## 2024-04-10 DIAGNOSIS — M1030 Gout due to renal impairment, unspecified site: Secondary | ICD-10-CM | POA: Diagnosis not present

## 2024-04-10 DIAGNOSIS — I130 Hypertensive heart and chronic kidney disease with heart failure and stage 1 through stage 4 chronic kidney disease, or unspecified chronic kidney disease: Secondary | ICD-10-CM | POA: Diagnosis not present

## 2024-04-10 DIAGNOSIS — N1832 Chronic kidney disease, stage 3b: Secondary | ICD-10-CM | POA: Diagnosis not present

## 2024-04-10 DIAGNOSIS — I501 Left ventricular failure: Secondary | ICD-10-CM | POA: Diagnosis not present

## 2024-04-20 DIAGNOSIS — I501 Left ventricular failure: Secondary | ICD-10-CM | POA: Diagnosis not present

## 2024-04-20 DIAGNOSIS — M1030 Gout due to renal impairment, unspecified site: Secondary | ICD-10-CM | POA: Diagnosis not present

## 2024-04-20 DIAGNOSIS — I5032 Chronic diastolic (congestive) heart failure: Secondary | ICD-10-CM | POA: Diagnosis not present

## 2024-04-20 DIAGNOSIS — I130 Hypertensive heart and chronic kidney disease with heart failure and stage 1 through stage 4 chronic kidney disease, or unspecified chronic kidney disease: Secondary | ICD-10-CM | POA: Diagnosis not present

## 2024-04-20 DIAGNOSIS — I480 Paroxysmal atrial fibrillation: Secondary | ICD-10-CM | POA: Diagnosis not present

## 2024-04-20 DIAGNOSIS — N1832 Chronic kidney disease, stage 3b: Secondary | ICD-10-CM | POA: Diagnosis not present

## 2024-04-21 ENCOUNTER — Encounter: Payer: Self-pay | Admitting: *Deleted

## 2024-04-21 NOTE — Progress Notes (Unsigned)
 OFFICE NOTE:    Date:  04/22/2024  ID:  Keith [name] Marker, DOB May 07, 1941, MRN [id number] PCP: Gerome Brunet, DO  [facility] Providers Cardiologist:  Gordy Bergamo, MD        Persistent atrial fibrillation  Admitted with acute CHF, AF w RVR >> s/p TEE DCCV 01/2024  Amiodarone  >> repeat DCCV 02/2024 (HFrEF) heart failure with reduced ejection fraction EF improved to normal in the past  TTE in 01/2020: EF 35-40 >> TTE in 06/2021: EF 50-55 EF ? with AFib w RVR TTE 01/21/24: EF 30-35, GR 1 DD, normal RVSF, moderate LAE, severe RAE, small pericardial effusion, moderate MR, AV sclerosis TEE 01/22/2024: EF 20-25, mild to moderate MR, mild TR, no LAA clot, PFO TTE 01/30/2024: EF 25-30, global HK, moderately reduced RVSF, mild LAE, mild RAE, moderate MR, mild-moderate AS (mean 9.6, V-max 194.8 cm/s, DI 0.35) Aortic stenosis Mild to mod by TTE 01/2024 Mitral regurgitation Mod MR by TTE 01/2024  Coronary artery Ca2+ Patent Foramen Ovale  Hypertension  Chronic kidney disease stage 3 Hyperlipidemia  Hx of DVT in 11/2022 BPH Parkinson's disease        Discussed the use of AI scribe software for clinical note transcription with the patient, who gave verbal consent to proceed. History of Present Illness [name] is a 83 y.o. male for follow up of AF, CHF. He was last seen 03/20/24.  I increased his bumetanide  secondary to volume overload. Renal function has remained stable.   He reports no issues since the last visit. No shortness of breath, chest discomfort, or syncope. He experiences vertigo, particularly when lying down, which causes spinning sensations and muscle discomfort in his neck. He has attended physical therapy for vertigo in the past. No dizziness when standing up.    ROS-See HPI    Studies Reviewed:  EKG Interpretation Date/Time:  Wednesday April 22 2024 09:41:51 EST Ventricular Rate:  58 PR Interval:  200 QRS Duration:  168 QT Interval:  510 QTC  Calculation: 500 R Axis:   -83  Text Interpretation: Sinus bradycardia with occasional Premature ventricular complexes Non-specific intra-ventricular conduction delay Reconfirmed by Lelon Hamilton [phone] on 04/22/2024 10:13:55 AM    LABS 03/20/2024: K 3.9, creatinine 1.89, magnesium 2, eGFR 35 03/26/2024: K 3.7, creatinine 2.31, eGFR 27 04/03/2024: K 3.9, creatinine 2.26, eGFR 28  03/20/2024: NT-proBNP 17,245 03/26/2024: NT-proBNP 14,425  Risk Assessment/Calculations: CHA2DS2-VASc Score = 5   This indicates a 7.2% annual risk of stroke. The patient's score is based upon: CHF History: 1 HTN History: 1 Diabetes History: 0 Stroke History: 0 Vascular Disease History: 1 Age Score: 2 Gender Score: 0           Physical Exam:  VS:  BP 108/66 (Cuff Size: Normal)   Pulse (!) 58   Ht 5' 6 (1.676 m)   Wt 189 lb (85.7 kg)   SpO2 97%   BMI 30.51 kg/m        Wt Readings from Last 3 Encounters:  04/22/24 189 lb (85.7 kg)  03/20/24 193 lb 3.2 oz (87.6 kg)  03/10/24 184 lb 9.6 oz (83.7 kg)    Constitutional:      Appearance: Healthy appearance. Not in distress.  Neck:     Vascular: No JVR.  Pulmonary:     Breath sounds: No wheezing. No rales.  Cardiovascular:     Bradycardia present. Regular rhythm.     Murmurs: There is a grade 2/6 systolic murmur at the URSB.  Edema:    Peripheral edema absent.  Abdominal:     Palpations: Abdomen is soft.       Assessment and Plan:    Assessment & Plan Persistent atrial fibrillation (HCC) On amiodarone  therapy He underwent TEE DCCV in 01/2024 but had return of AFib. He was loaded with Amiodarone  and underwent repeat DCCV in 02/2024 and has maintained NSR. Baseline PFTs with DLCO have not been scheduled yet.  He is maintaining sinus rhythm on amiodarone . EKG shows interventricular conduction delay, which is new. Heart rate is in the fifties, which is stable.  He seems to be stable from a volume standpoint. - Continue amiodarone  200 mg  daily - Continue Eliquis  2.5 mg twice daily based upon age and creatinine - Continue Bystolic  5 mg daily - PFTs pending. - Consider follow up TSH, LFTs at next OV. - Follow up in 3 months with Dr. Ladona and me in 6 months. HFrEF (heart failure with reduced ejection fraction) (HCC) He has a hx of reduced EF that improved to normal. He was admitted in 01/2024 with acute CHF and AFib. His EF was again reduced. Last TTE in 01/2024 with EF 25-30.  Follow up TTE to reassess LVF is pending later this month. Volume status is currently stable. Recent creatinine is stable. Awaiting repeat echocardiogram on December 18th, 2025. - Continue Bumex  1 mg daily - Continue Jardiance  10 mg daily - Continue irbesartan  150 mg daily - Continue Bystolic  5 mg daily - Echocardiogram pending December 18th, 2025 - Follow up 3 mos w Dr. Ladona and me in 6 mos.  Stage 3a chronic kidney disease (HCC) Stable creatinine levels. GFR is approximately 30. Nonrheumatic aortic (valve) stenosis Mild to moderate by echocardiogram in September 2025.  Repeat echocardiogram pending this month. Nonrheumatic mitral valve regurgitation Moderate mitral digitation by echocardiogram September 2025.  Repeat echo pending 05/07/2024. Hypercholesteremia Continued management with atorvastatin .  - Continue atorvastatin  10 mg daily; refills sent in today.        Dispo:  Return in about 3 months (around 07/21/2024) for Routine Follow Up, w/ Dr. Ladona.  Signed, Glendia Ferrier, PA-C

## 2024-04-21 NOTE — Assessment & Plan Note (Signed)
 Moderate mitral digitation by echocardiogram September 2025.  Repeat echo pending 05/07/2024.

## 2024-04-21 NOTE — Assessment & Plan Note (Signed)
 He has a hx of reduced EF that improved to normal. He was admitted in 01/2024 with acute CHF and AFib. His EF was again reduced. Last TTE in 01/2024 with EF 25-30.  Follow up TTE to reassess LVF is pending later this month. Volume status is currently stable. Recent creatinine is stable. Awaiting repeat echocardiogram on December 18th, 2025. - Continue Bumex  1 mg daily - Continue Jardiance  10 mg daily - Continue irbesartan  150 mg daily - Continue Bystolic  5 mg daily - Echocardiogram pending December 18th, 2025 - Follow up 3 mos w Dr. Ladona and me in 6 mos.

## 2024-04-21 NOTE — Assessment & Plan Note (Signed)
 He underwent TEE DCCV in 01/2024 but had return of AFib. He was loaded with Amiodarone  and underwent repeat DCCV in 02/2024 and has maintained NSR. Baseline PFTs with DLCO have not been scheduled yet. ***

## 2024-04-21 NOTE — Assessment & Plan Note (Signed)
 Mild to moderate by echocardiogram in September 2025.  Repeat echocardiogram pending this month.

## 2024-04-22 ENCOUNTER — Ambulatory Visit: Attending: Physician Assistant | Admitting: Physician Assistant

## 2024-04-22 ENCOUNTER — Ambulatory Visit: Admitting: Physician Assistant

## 2024-04-22 ENCOUNTER — Encounter: Payer: Self-pay | Admitting: Physician Assistant

## 2024-04-22 VITALS — BP 108/66 | HR 58 | Ht 66.0 in | Wt 189.0 lb

## 2024-04-22 DIAGNOSIS — I5020 Unspecified systolic (congestive) heart failure: Secondary | ICD-10-CM | POA: Insufficient documentation

## 2024-04-22 DIAGNOSIS — I4819 Other persistent atrial fibrillation: Secondary | ICD-10-CM | POA: Diagnosis not present

## 2024-04-22 DIAGNOSIS — N1831 Chronic kidney disease, stage 3a: Secondary | ICD-10-CM | POA: Insufficient documentation

## 2024-04-22 DIAGNOSIS — E7800 Pure hypercholesterolemia, unspecified: Secondary | ICD-10-CM | POA: Insufficient documentation

## 2024-04-22 DIAGNOSIS — I350 Nonrheumatic aortic (valve) stenosis: Secondary | ICD-10-CM | POA: Diagnosis not present

## 2024-04-22 DIAGNOSIS — I340 Nonrheumatic mitral (valve) insufficiency: Secondary | ICD-10-CM | POA: Insufficient documentation

## 2024-04-22 DIAGNOSIS — Z79899 Other long term (current) drug therapy: Secondary | ICD-10-CM | POA: Insufficient documentation

## 2024-04-22 MED ORDER — ATORVASTATIN CALCIUM 10 MG PO TABS: 10.0000 mg | ORAL_TABLET | Freq: Every day | ORAL | 3 refills | Status: AC

## 2024-04-22 NOTE — Patient Instructions (Signed)
 Medication Instructions:  Your physician recommends that you continue on your current medications as directed. Please refer to the Current Medication list given to you today.  *If you need a refill on your cardiac medications before your next appointment, please call your pharmacy*  Lab Work: NONE ordered at this time of appointment   Testing/Procedures: NONE ordered at this time of appointment   Follow-Up: At [facility], you and your health needs are our priority.  As part of our continuing mission to provide you with exceptional heart care, our providers are all part of one team.  This team includes your primary Cardiologist (physician) and Advanced Practice Providers or APPs (Physician Assistants and Nurse Practitioners) who all work together to provide you with the care you need, when you need it.  Your next appointment:   3 months Dr. Ladona 6 months Glendia Ferrier PA  Provider:   Gordy Ladona, MD or Glendia Ferrier, PA-C          We recommend signing up for the patient portal called MyChart.  Sign up information is provided on this After Visit Summary.  MyChart is used to connect with patients for Virtual Visits (Telemedicine).  Patients are able to view lab/test results, encounter notes, upcoming appointments, etc.  Non-urgent messages can be sent to your provider as well.   To learn more about what you can do with MyChart, go to [email or web address].

## 2024-04-22 NOTE — Assessment & Plan Note (Signed)
 Stable creatinine levels. GFR is approximately 30.

## 2024-05-07 ENCOUNTER — Ambulatory Visit (HOSPITAL_COMMUNITY)
Admission: RE | Admit: 2024-05-07 | Discharge: 2024-05-07 | Attending: Cardiovascular Disease | Admitting: Cardiovascular Disease

## 2024-05-07 DIAGNOSIS — I350 Nonrheumatic aortic (valve) stenosis: Secondary | ICD-10-CM | POA: Diagnosis present

## 2024-05-07 DIAGNOSIS — N1831 Chronic kidney disease, stage 3a: Secondary | ICD-10-CM

## 2024-05-07 DIAGNOSIS — I10 Essential (primary) hypertension: Secondary | ICD-10-CM | POA: Diagnosis present

## 2024-05-07 DIAGNOSIS — I5023 Acute on chronic systolic (congestive) heart failure: Secondary | ICD-10-CM | POA: Diagnosis not present

## 2024-05-07 DIAGNOSIS — I4819 Other persistent atrial fibrillation: Secondary | ICD-10-CM

## 2024-05-07 DIAGNOSIS — I340 Nonrheumatic mitral (valve) insufficiency: Secondary | ICD-10-CM | POA: Diagnosis present

## 2024-05-07 LAB — ECHOCARDIOGRAM LIMITED
AR max vel: 1.44 cm2
AV Area VTI: 1.52 cm2
AV Area mean vel: 1.33 cm2
AV Mean grad: 8.5 mmHg
AV Peak grad: 15.5 mmHg
Ao pk vel: 1.97 m/s
Area-P 1/2: 2.58 cm2
S' Lateral: 4.7 cm

## 2024-05-07 MED ORDER — PERFLUTREN LIPID MICROSPHERE
1.0000 mL | INTRAVENOUS | Status: AC | PRN
  Administered 2024-05-07: 11:00:00 2 mL via INTRAVENOUS

## 2024-06-30 ENCOUNTER — Ambulatory Visit: Admitting: Diagnostic Neuroimaging

## 2024-07-06 ENCOUNTER — Ambulatory Visit: Admitting: Diagnostic Neuroimaging

## 2024-07-27 ENCOUNTER — Ambulatory Visit: Admitting: Cardiology
